# Patient Record
Sex: Male | Born: 1960
Health system: Southern US, Community
[De-identification: ages and names within clinical notes are randomized; demographics above are authoritative.]

## PROBLEM LIST (undated history)

## (undated) DIAGNOSIS — F32A Depression, unspecified: Secondary | ICD-10-CM

## (undated) DIAGNOSIS — N400 Enlarged prostate without lower urinary tract symptoms: Secondary | ICD-10-CM

## (undated) DIAGNOSIS — E785 Hyperlipidemia, unspecified: Secondary | ICD-10-CM

## (undated) DIAGNOSIS — E119 Type 2 diabetes mellitus without complications: Secondary | ICD-10-CM

## (undated) DIAGNOSIS — F329 Major depressive disorder, single episode, unspecified: Secondary | ICD-10-CM

## (undated) DIAGNOSIS — M1990 Unspecified osteoarthritis, unspecified site: Secondary | ICD-10-CM

## (undated) HISTORY — DX: Depression, unspecified: F32.A

## (undated) HISTORY — PX: FRACTURE SURGERY: SHX138

## (undated) HISTORY — DX: Benign prostatic hyperplasia without lower urinary tract symptoms: N40.0

## (undated) HISTORY — PX: TOTAL HIP ARTHROPLASTY: SHX124

## (undated) HISTORY — DX: Unspecified osteoarthritis, unspecified site: M19.90

## (undated) HISTORY — DX: Major depressive disorder, single episode, unspecified: F32.9

## (undated) HISTORY — PX: SPINE SURGERY: SHX786

## (undated) HISTORY — DX: Type 2 diabetes mellitus without complications: E11.9

## (undated) HISTORY — DX: Hyperlipidemia, unspecified: E78.5

## (undated) HISTORY — PX: CYSTOSCOPY WITH INSERTION OF UROLIFT: SHX6678

---

## 2006-04-21 ENCOUNTER — Encounter
Admission: RE | Admit: 2006-04-21 | Discharge: 2006-04-21 | Payer: Self-pay | Admitting: Physical Medicine and Rehabilitation

## 2006-05-28 ENCOUNTER — Encounter: Admission: RE | Admit: 2006-05-28 | Discharge: 2006-05-28 | Payer: Self-pay | Admitting: Specialist

## 2006-06-18 ENCOUNTER — Ambulatory Visit (HOSPITAL_COMMUNITY): Admission: RE | Admit: 2006-06-18 | Discharge: 2006-06-19 | Payer: Self-pay | Admitting: Specialist

## 2009-06-26 ENCOUNTER — Ambulatory Visit (HOSPITAL_COMMUNITY): Admission: RE | Admit: 2009-06-26 | Discharge: 2009-06-27 | Payer: Self-pay | Admitting: Specialist

## 2010-04-21 LAB — BASIC METABOLIC PANEL
BUN: 6 mg/dL (ref 6–23)
CO2: 31 mEq/L (ref 19–32)
Calcium: 9.1 mg/dL (ref 8.4–10.5)
Chloride: 104 mEq/L (ref 96–112)
Creatinine, Ser: 0.75 mg/dL (ref 0.4–1.5)
GFR calc non Af Amer: >60 mL/min (ref >60)
Glucose, Bld: 152 mg/dL — ABNORMAL HIGH (ref 70–99)
Sodium: 141 mEq/L (ref 135–145)

## 2010-04-21 LAB — COMPREHENSIVE METABOLIC PANEL
BUN: 7 mg/dL (ref 6–23)
Chloride: 103 mEq/L (ref 96–112)
Creatinine, Ser: 0.68 mg/dL (ref 0.4–1.5)
GFR calc non Af Amer: >60 mL/min (ref >60)
Sodium: 140 mEq/L (ref 135–145)
Total Protein: 7 g/dL (ref 6.0–8.3)

## 2010-04-21 LAB — CBC
HCT: 40.8 % (ref 39.0–52.0)
Hemoglobin: 13.3 g/dL (ref 13.0–17.0)
MCHC: 34.2 g/dL (ref 30.0–36.0)
MCV: 94.3 fL (ref 78.0–100.0)
RBC: 4.38 MIL/uL (ref 4.22–5.81)
RDW: 12.7 % (ref 11.5–15.5)
WBC: 3 10*3/uL — ABNORMAL LOW (ref 4.0–10.5)

## 2010-04-21 LAB — URINALYSIS, ROUTINE W REFLEX MICROSCOPIC
Bilirubin Urine: NEGATIVE
Glucose, UA: 100 mg/dL — AB
Nitrite: NEGATIVE
Specific Gravity, Urine: 1.022 (ref 1.005–1.030)
pH: 7 (ref 5.0–8.0)

## 2010-04-21 LAB — APTT: aPTT: 32 seconds (ref 24–37)

## 2010-06-17 NOTE — Op Note (Signed)
Brad Johnson, Brad Johnson NO.:  [id number]   MEDICAL RECORD NO.:  [id number]          PATIENT TYPE:  AMB   LOCATION:  DAY                          FACILITY:  [facility]   PHYSICIAN:  Jene Every, M.D.    DATE OF BIRTH:  1960/10/27   DATE OF PROCEDURE:  06/18/2006  DATE OF DISCHARGE:                               OPERATIVE REPORT   PREOPERATIVE DIAGNOSIS:  Spinal stenosis, retrolisthesis, 4-5.   POSTOPERATIVE DIAGNOSIS:  Spinal stenosis, retrolisthesis, 4-5.   PROCEDURE PERFORMED:  1. Insertion of X STOP at L4-5.  2. Excision of posterior element.   ANESTHESIA:  General.   ASSISTANT:  None.   BRIEF HISTORY/INDICATIONS:  This is a 50 year old with retrolisthesis,  lateral recess stenosis.  Operative intervention was indicated for  insertion of X STOP.  Patient had relief of symptoms with sitting and  leaning forward.  Precipitation of the symptoms with standing.  He was  able to walk 50 feet.  He had lateral recess stenosis and  retrolisthesis.  The options were whether to decompress him.  He was a  fairly large size, and he has retrolisthesis and congenital stenosis.  We felt that would further destabilize the posterior elements and  exacerbate his listhesis.  We felt that placement of X STOP would avoid  extension and avoid loading of the lateral recesses, and in his large  size, not prevent manual duty.  We discussed the risks and benefits,  including bleeding, infection, no change in symptoms or worsening  symptoms, need for X STOP removal and standard decompression, etc.   TECHNIQUE:  With the patient in a supine position after the induction of  adequate general anesthesia and 2 gm of Kefzol, antimicrobial  prophylaxis, he was placed prone on the Wilson frame.  All bony  prominences were well padded.  The lumbar area is prepped and draped in  the usual sterile fashion.  An 18 gauge spinal needle was utilized to  localize the 4-5 interspace and confirmed with  x-ray.  An incision was  made from the spinous process of 4-5, and subcutaneous tissue was  dissected.  Electrocautery was utilized to achieve hemostasis.  The  dorsal lumbar fascia was identified and divided on either side of the  interspinous ligament with a #10 blade, sparing the interspinous  ligament.  Paraspinous musculature was elevated from the lamina of 4-5.  I digitally palpated the space anteriorly between the 4-5 spinous  processes and under x-ray visualization, placed a first dilator between  the interspinous ligament just posterior to the lamina from right to  left without difficulty.  I then inserted the distractor and distracted  to a 10 mm distraction torque.  This was left for a period of two  minutes for relaxation.  I then selected the 10 with a rongeur and  digitally removed with the rongeur portion of the posterior lamina for  easy insertion.  This is over onto the facet.  It was then inserted  without impedence.  Once this was inserted and confirmed by x-ray, we  attached a contralateral fin and provisionally set the  facet screw and  clamped either side of the fin, and then completed the torque.  We  completed the fixation with the torque wrench.  It was found to be in  satisfactory position.  The AP and lateral plane was posterior to the  lamina medial to lateral and the AP plane satisfactory.  Electrocautery  was utilized to achieve hemostasis, 0.25% Marcaine with epinephrine was  infiltrated into the paraspinous musculature.  Retractors were removed.  Paraspinous muscles were inspected.  No evidence of active bleeding.  I  repaired the dorsal lumbar fascia on either side of the interspinous  ligament with #1 Vicryl interrupted figure-of-eight sutures.  Subcutaneous tissue was reapproximated with 2-0 Vicryl simple sutures in  layers.  The skin was reapproximated with staples.  The wound was  dressed sterilely.  Placed supine on the hospital bed, extubated without   difficulty, and transported to the recovery room in satisfactory  condition.  Patient tolerated the procedure well with no complications.  Minimal blood loss.  No assistant.   When he was transferred to the hospital bed, we kept his knees and his  hips flexed to prevent an extension.      Jene Every, M.D.  Electronically Signed     JB/MEDQ  D:  06/18/2006  T:  06/18/2006  Job:  [id number]

## 2011-02-24 DIAGNOSIS — IMO0002 Reserved for concepts with insufficient information to code with codable children: Secondary | ICD-10-CM | POA: Diagnosis not present

## 2011-03-30 DIAGNOSIS — IMO0002 Reserved for concepts with insufficient information to code with codable children: Secondary | ICD-10-CM | POA: Diagnosis not present

## 2011-04-14 DIAGNOSIS — R1084 Generalized abdominal pain: Secondary | ICD-10-CM | POA: Diagnosis not present

## 2011-04-14 DIAGNOSIS — N419 Inflammatory disease of prostate, unspecified: Secondary | ICD-10-CM | POA: Diagnosis not present

## 2011-04-21 DIAGNOSIS — E785 Hyperlipidemia, unspecified: Secondary | ICD-10-CM | POA: Diagnosis not present

## 2011-04-21 DIAGNOSIS — I10 Essential (primary) hypertension: Secondary | ICD-10-CM | POA: Diagnosis not present

## 2011-04-21 DIAGNOSIS — S060X9A Concussion with loss of consciousness of unspecified duration, initial encounter: Secondary | ICD-10-CM | POA: Diagnosis not present

## 2011-05-05 DIAGNOSIS — D126 Benign neoplasm of colon, unspecified: Secondary | ICD-10-CM | POA: Diagnosis not present

## 2011-05-05 DIAGNOSIS — Z1211 Encounter for screening for malignant neoplasm of colon: Secondary | ICD-10-CM | POA: Diagnosis not present

## 2011-05-05 DIAGNOSIS — D128 Benign neoplasm of rectum: Secondary | ICD-10-CM | POA: Diagnosis not present

## 2011-05-21 DIAGNOSIS — E1149 Type 2 diabetes mellitus with other diabetic neurological complication: Secondary | ICD-10-CM | POA: Diagnosis not present

## 2011-05-21 DIAGNOSIS — E785 Hyperlipidemia, unspecified: Secondary | ICD-10-CM | POA: Diagnosis not present

## 2011-05-21 DIAGNOSIS — F0390 Unspecified dementia without behavioral disturbance: Secondary | ICD-10-CM | POA: Diagnosis not present

## 2011-05-21 DIAGNOSIS — I10 Essential (primary) hypertension: Secondary | ICD-10-CM | POA: Diagnosis not present

## 2011-05-26 DIAGNOSIS — IMO0002 Reserved for concepts with insufficient information to code with codable children: Secondary | ICD-10-CM | POA: Diagnosis not present

## 2011-06-11 DIAGNOSIS — IMO0002 Reserved for concepts with insufficient information to code with codable children: Secondary | ICD-10-CM | POA: Diagnosis not present

## 2011-07-15 DIAGNOSIS — M48061 Spinal stenosis, lumbar region without neurogenic claudication: Secondary | ICD-10-CM | POA: Diagnosis not present

## 2011-07-21 DIAGNOSIS — I10 Essential (primary) hypertension: Secondary | ICD-10-CM | POA: Diagnosis not present

## 2011-07-21 DIAGNOSIS — F0390 Unspecified dementia without behavioral disturbance: Secondary | ICD-10-CM | POA: Diagnosis not present

## 2011-07-21 DIAGNOSIS — E1149 Type 2 diabetes mellitus with other diabetic neurological complication: Secondary | ICD-10-CM | POA: Diagnosis not present

## 2011-07-21 DIAGNOSIS — E785 Hyperlipidemia, unspecified: Secondary | ICD-10-CM | POA: Diagnosis not present

## 2011-08-12 DIAGNOSIS — M961 Postlaminectomy syndrome, not elsewhere classified: Secondary | ICD-10-CM | POA: Diagnosis not present

## 2011-08-12 DIAGNOSIS — M549 Dorsalgia, unspecified: Secondary | ICD-10-CM | POA: Diagnosis not present

## 2011-08-12 DIAGNOSIS — M5137 Other intervertebral disc degeneration, lumbosacral region: Secondary | ICD-10-CM | POA: Diagnosis not present

## 2011-08-12 DIAGNOSIS — G894 Chronic pain syndrome: Secondary | ICD-10-CM | POA: Diagnosis not present

## 2011-10-28 DIAGNOSIS — E1149 Type 2 diabetes mellitus with other diabetic neurological complication: Secondary | ICD-10-CM | POA: Diagnosis not present

## 2011-10-28 DIAGNOSIS — I10 Essential (primary) hypertension: Secondary | ICD-10-CM | POA: Diagnosis not present

## 2011-10-28 DIAGNOSIS — E785 Hyperlipidemia, unspecified: Secondary | ICD-10-CM | POA: Diagnosis not present

## 2011-10-28 DIAGNOSIS — F0390 Unspecified dementia without behavioral disturbance: Secondary | ICD-10-CM | POA: Diagnosis not present

## 2011-10-28 DIAGNOSIS — E291 Testicular hypofunction: Secondary | ICD-10-CM | POA: Diagnosis not present

## 2011-11-04 DIAGNOSIS — G894 Chronic pain syndrome: Secondary | ICD-10-CM | POA: Diagnosis not present

## 2011-11-25 DIAGNOSIS — E1149 Type 2 diabetes mellitus with other diabetic neurological complication: Secondary | ICD-10-CM | POA: Diagnosis not present

## 2011-11-25 DIAGNOSIS — E1142 Type 2 diabetes mellitus with diabetic polyneuropathy: Secondary | ICD-10-CM | POA: Diagnosis not present

## 2011-12-17 DIAGNOSIS — J209 Acute bronchitis, unspecified: Secondary | ICD-10-CM | POA: Diagnosis not present

## 2011-12-17 DIAGNOSIS — J019 Acute sinusitis, unspecified: Secondary | ICD-10-CM | POA: Diagnosis not present

## 2011-12-29 DIAGNOSIS — M549 Dorsalgia, unspecified: Secondary | ICD-10-CM | POA: Diagnosis not present

## 2011-12-29 DIAGNOSIS — M48061 Spinal stenosis, lumbar region without neurogenic claudication: Secondary | ICD-10-CM | POA: Diagnosis not present

## 2011-12-29 DIAGNOSIS — G894 Chronic pain syndrome: Secondary | ICD-10-CM | POA: Diagnosis not present

## 2012-01-25 DIAGNOSIS — E119 Type 2 diabetes mellitus without complications: Secondary | ICD-10-CM | POA: Diagnosis not present

## 2012-01-25 DIAGNOSIS — I10 Essential (primary) hypertension: Secondary | ICD-10-CM | POA: Diagnosis not present

## 2012-01-25 DIAGNOSIS — E785 Hyperlipidemia, unspecified: Secondary | ICD-10-CM | POA: Diagnosis not present

## 2012-01-25 DIAGNOSIS — E1149 Type 2 diabetes mellitus with other diabetic neurological complication: Secondary | ICD-10-CM | POA: Diagnosis not present

## 2012-01-25 DIAGNOSIS — E1142 Type 2 diabetes mellitus with diabetic polyneuropathy: Secondary | ICD-10-CM | POA: Diagnosis not present

## 2012-02-06 DIAGNOSIS — J209 Acute bronchitis, unspecified: Secondary | ICD-10-CM | POA: Diagnosis not present

## 2012-02-06 DIAGNOSIS — J069 Acute upper respiratory infection, unspecified: Secondary | ICD-10-CM | POA: Diagnosis not present

## 2012-02-27 IMAGING — CR DG CHEST 2V
2 series · 2 of 2 positions shown · non-contrast
Comparison: None.

CLINICAL DATA: 48-year-old male preoperative study for spinal
stenosis.

CHEST - 2 VIEW

[w chest pa]
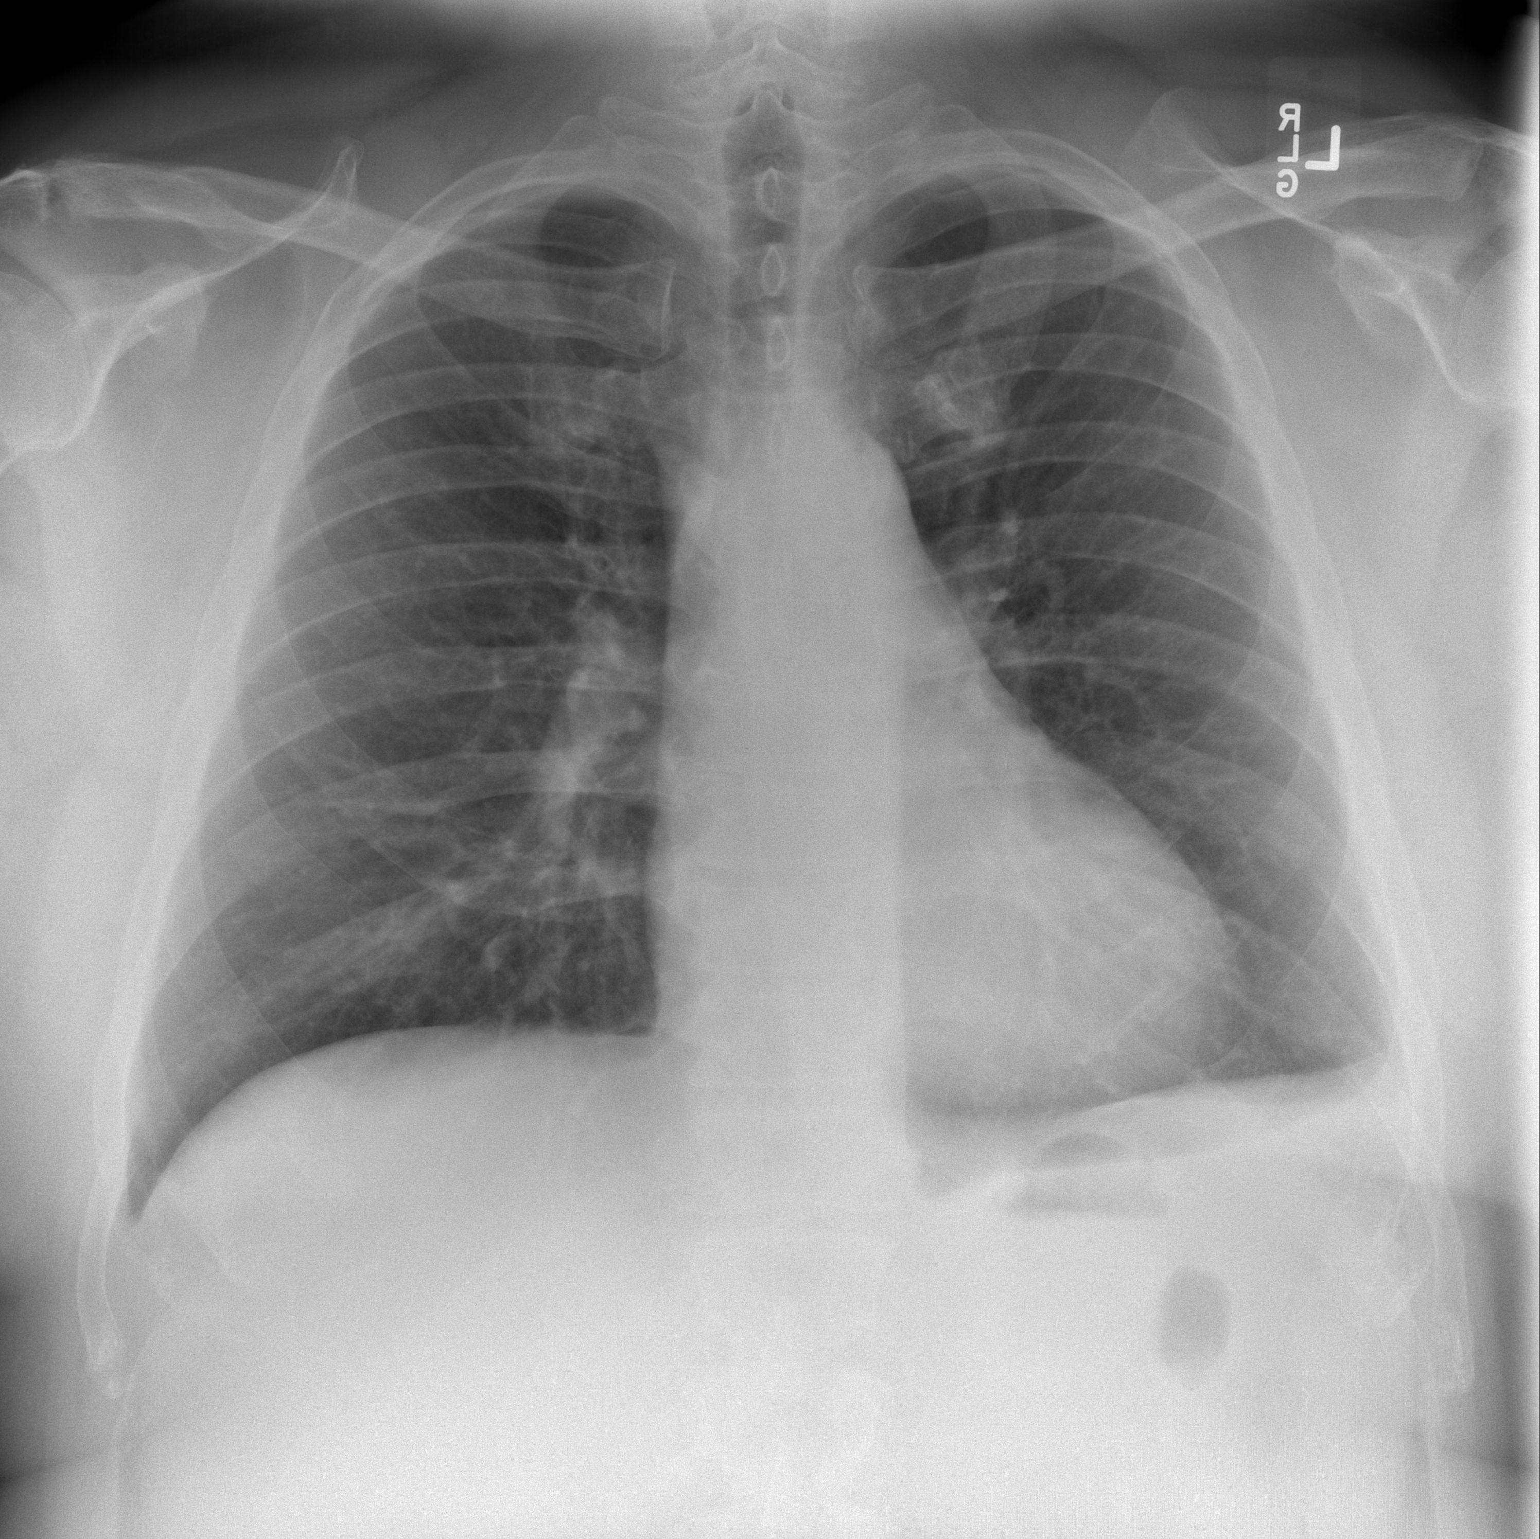

[w chest lat]
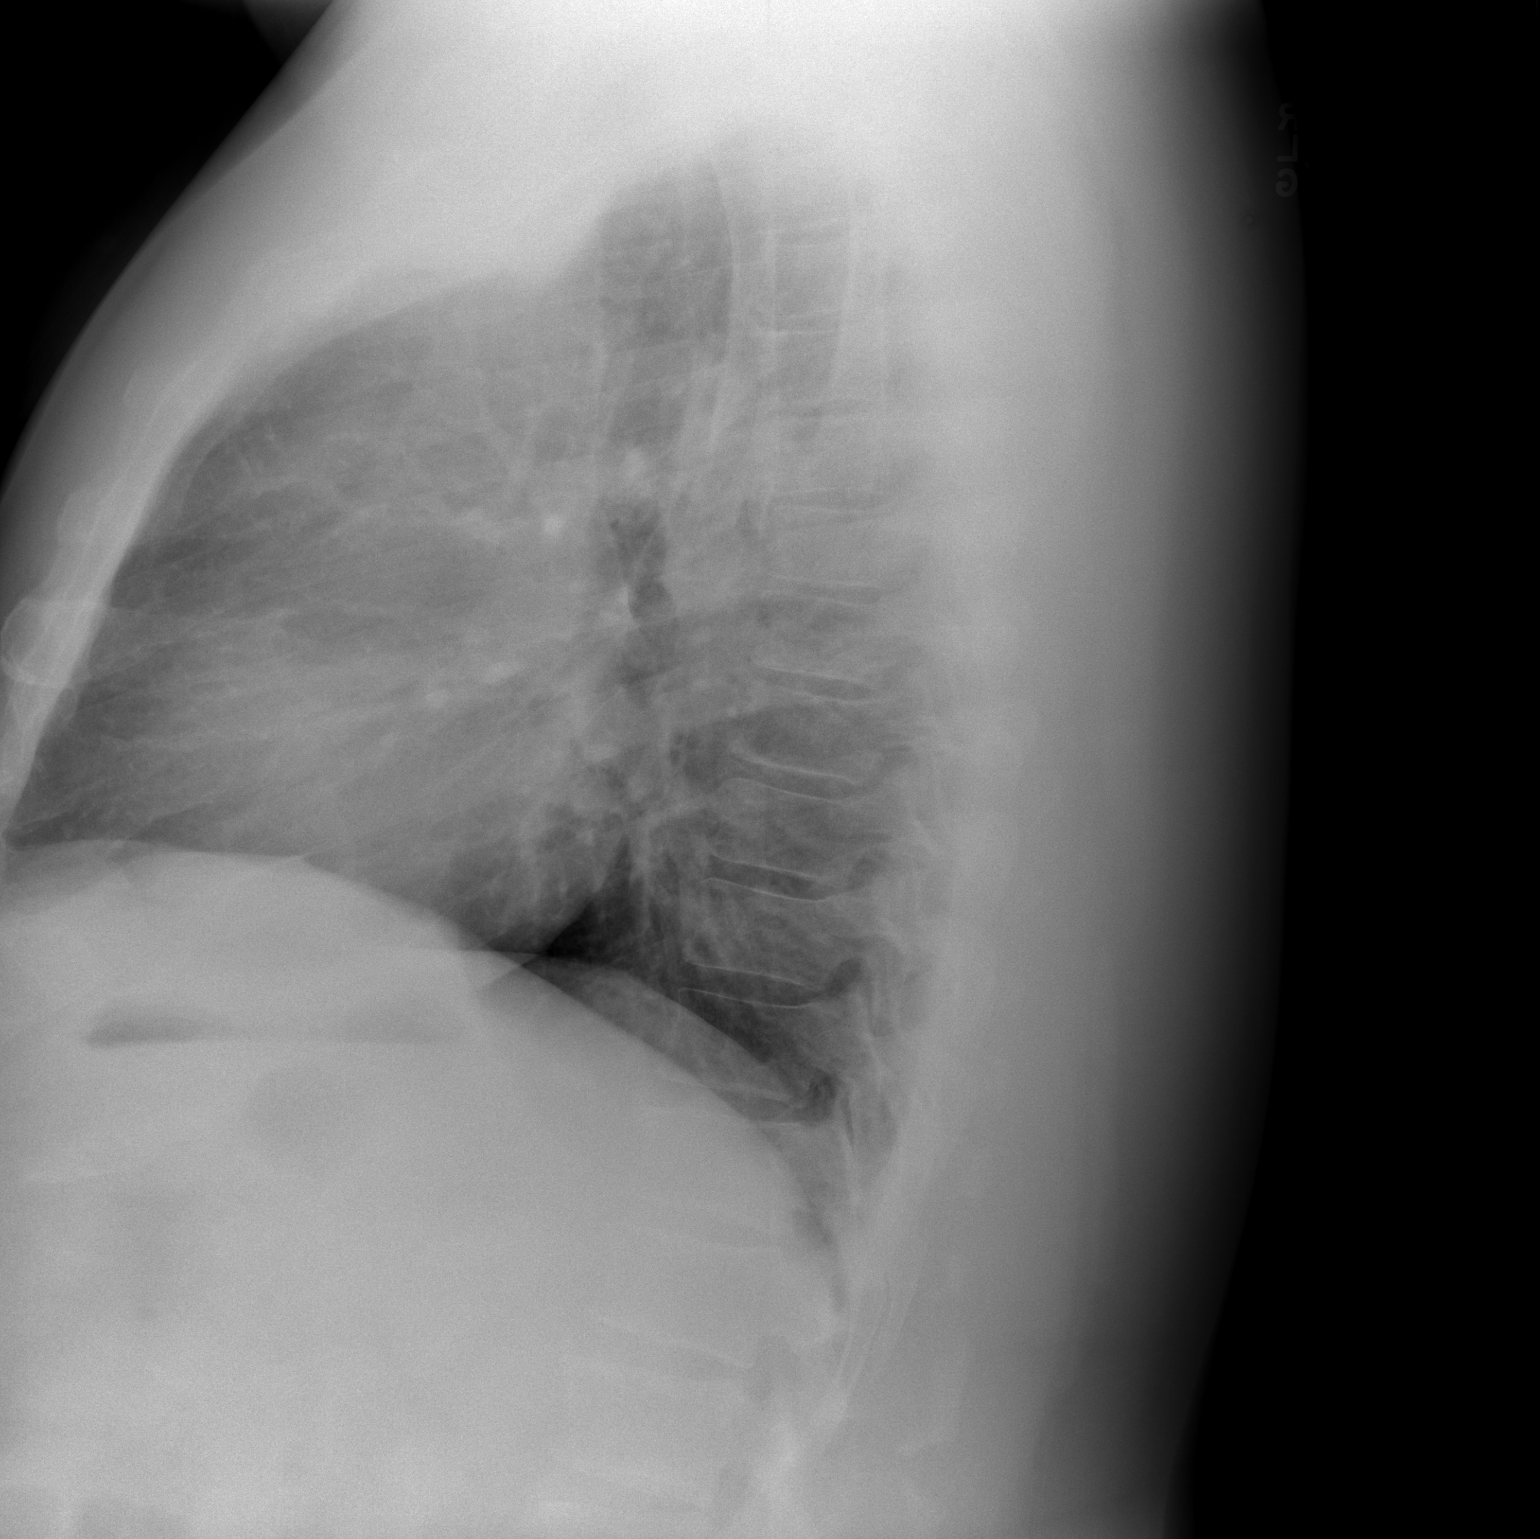

[2 of 2 positions shown; findings below may reference images not displayed]

FINDINGS: Incidental note made of 12 sets of ribs.  Blunting of the
left lateral costophrenic sulcus without other evidence of pleural
effusion.  Cardiac size and mediastinal contours are within normal
limits.  Lung volumes are within normal limits.  No pneumothorax,
pulmonary edema or confluent airspace opacity. Visualized tracheal
air column is within normal limits.
IMPRESSION: Left lateral costophrenic angle pleural scarring suspected. No
acute cardiopulmonary abnormality.

## 2012-02-28 IMAGING — CR DG OR PORTABLE SPINE
1 series · 1 of 1 positions shown · non-contrast
Comparison: 06/26/2009

CLINICAL DATA: Spinal stenosis at L3-L5.

PORTABLE SPINE

[view not recorded]
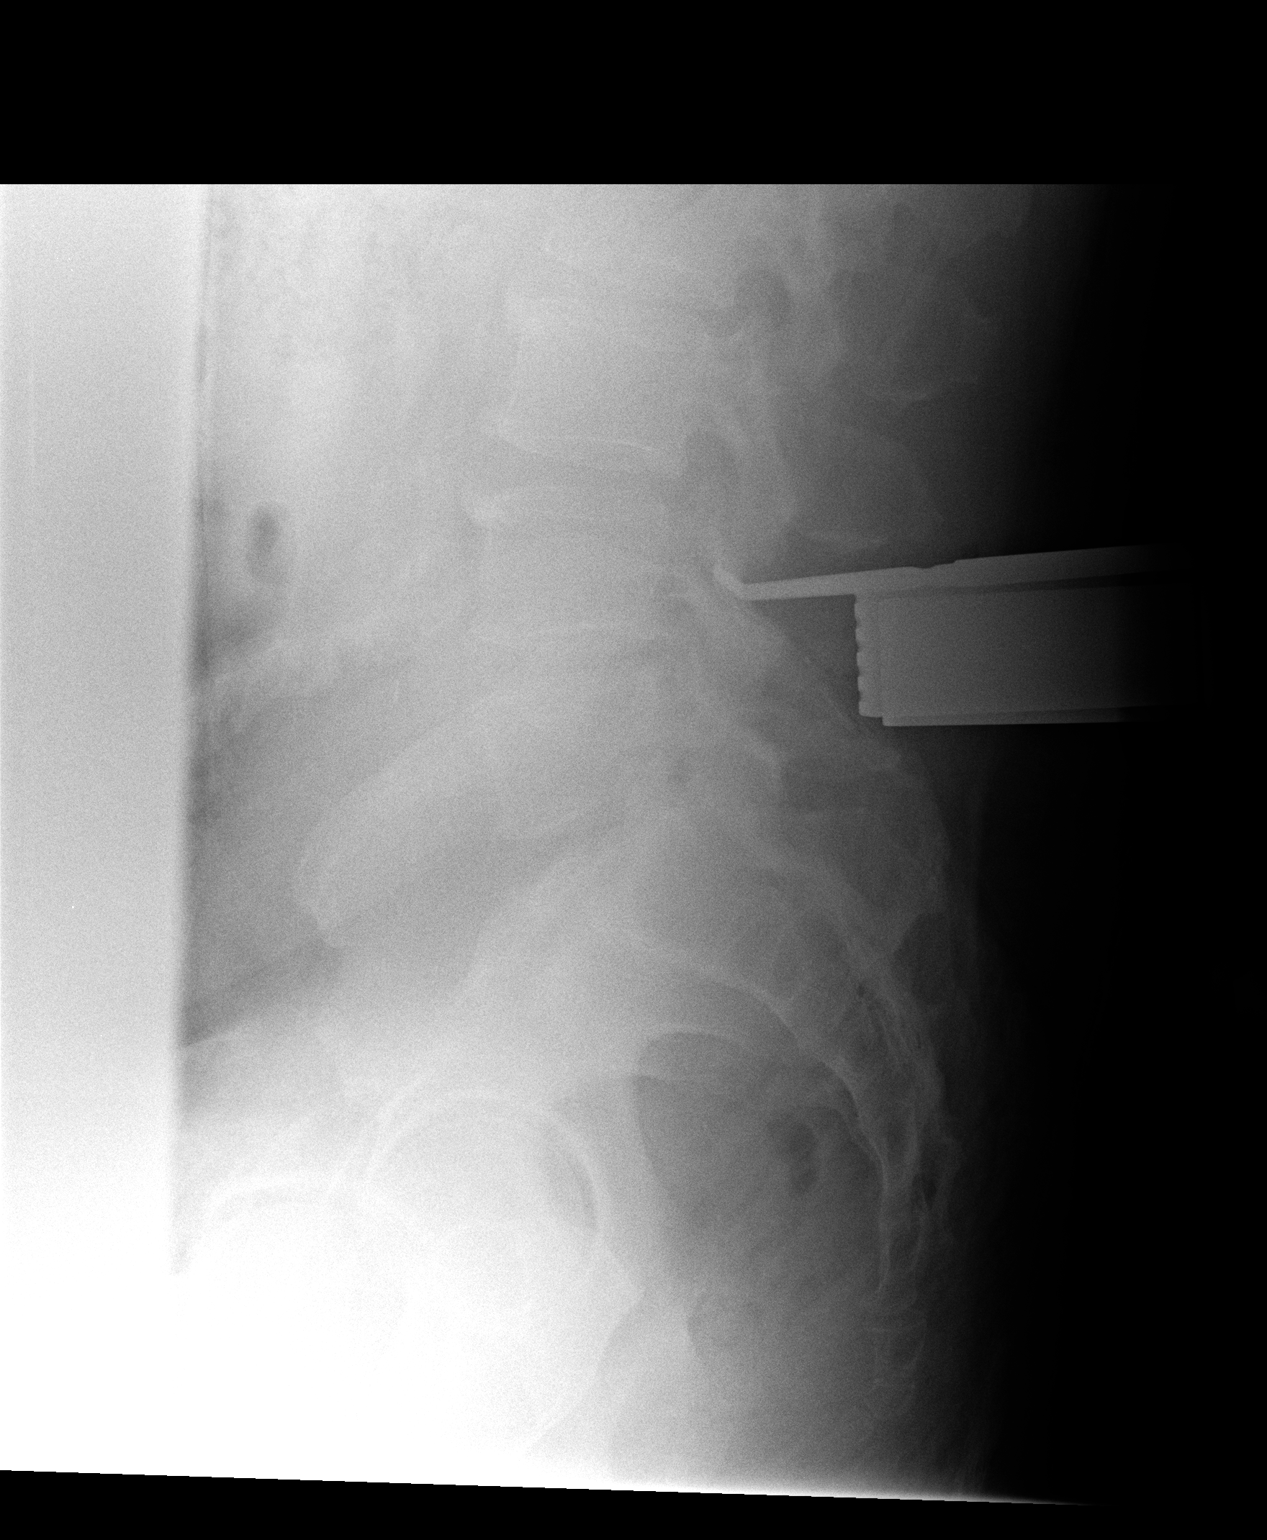

[1 of 1 positions shown; findings below may reference images not displayed]

FINDINGS: There are tissue spreaders posteriorly overlying the L4
spinous process.  There is a surgical instrument overlying the
spinal canal directed at the mid level of the L4 vertebral body.

No acute fracture is seen.  There is lumbar disc degeneration.  The
lowest open interspace is assigned L5-S1.
IMPRESSION: Surgical instrument directed at the L4 vertebral body.

## 2012-03-23 DIAGNOSIS — M48061 Spinal stenosis, lumbar region without neurogenic claudication: Secondary | ICD-10-CM | POA: Diagnosis not present

## 2012-04-01 DIAGNOSIS — N471 Phimosis: Secondary | ICD-10-CM | POA: Diagnosis not present

## 2012-04-01 DIAGNOSIS — N478 Other disorders of prepuce: Secondary | ICD-10-CM | POA: Diagnosis not present

## 2012-04-07 DIAGNOSIS — N471 Phimosis: Secondary | ICD-10-CM | POA: Diagnosis not present

## 2012-04-07 DIAGNOSIS — N478 Other disorders of prepuce: Secondary | ICD-10-CM | POA: Diagnosis not present

## 2012-04-08 DIAGNOSIS — N400 Enlarged prostate without lower urinary tract symptoms: Secondary | ICD-10-CM | POA: Diagnosis not present

## 2012-04-08 DIAGNOSIS — N476 Balanoposthitis: Secondary | ICD-10-CM | POA: Diagnosis not present

## 2012-04-08 DIAGNOSIS — N390 Urinary tract infection, site not specified: Secondary | ICD-10-CM | POA: Diagnosis not present

## 2012-04-08 DIAGNOSIS — N478 Other disorders of prepuce: Secondary | ICD-10-CM | POA: Diagnosis not present

## 2012-04-11 DIAGNOSIS — N23 Unspecified renal colic: Secondary | ICD-10-CM | POA: Diagnosis not present

## 2012-04-11 DIAGNOSIS — R351 Nocturia: Secondary | ICD-10-CM | POA: Diagnosis not present

## 2012-04-11 DIAGNOSIS — N478 Other disorders of prepuce: Secondary | ICD-10-CM | POA: Diagnosis not present

## 2012-04-11 DIAGNOSIS — N476 Balanoposthitis: Secondary | ICD-10-CM | POA: Diagnosis not present

## 2012-04-13 DIAGNOSIS — N478 Other disorders of prepuce: Secondary | ICD-10-CM | POA: Diagnosis not present

## 2012-04-13 DIAGNOSIS — Z79899 Other long term (current) drug therapy: Secondary | ICD-10-CM | POA: Diagnosis not present

## 2012-04-13 DIAGNOSIS — N471 Phimosis: Secondary | ICD-10-CM | POA: Diagnosis not present

## 2012-04-13 DIAGNOSIS — E119 Type 2 diabetes mellitus without complications: Secondary | ICD-10-CM | POA: Diagnosis not present

## 2012-04-13 DIAGNOSIS — E669 Obesity, unspecified: Secondary | ICD-10-CM | POA: Diagnosis not present

## 2012-04-19 DIAGNOSIS — N471 Phimosis: Secondary | ICD-10-CM | POA: Diagnosis not present

## 2012-04-19 DIAGNOSIS — N390 Urinary tract infection, site not specified: Secondary | ICD-10-CM | POA: Diagnosis not present

## 2012-04-19 DIAGNOSIS — N476 Balanoposthitis: Secondary | ICD-10-CM | POA: Diagnosis not present

## 2012-04-19 DIAGNOSIS — M545 Low back pain: Secondary | ICD-10-CM | POA: Diagnosis not present

## 2012-05-04 DIAGNOSIS — N478 Other disorders of prepuce: Secondary | ICD-10-CM | POA: Diagnosis not present

## 2012-05-04 DIAGNOSIS — N390 Urinary tract infection, site not specified: Secondary | ICD-10-CM | POA: Diagnosis not present

## 2012-05-04 DIAGNOSIS — N476 Balanoposthitis: Secondary | ICD-10-CM | POA: Diagnosis not present

## 2012-05-04 DIAGNOSIS — N23 Unspecified renal colic: Secondary | ICD-10-CM | POA: Diagnosis not present

## 2012-05-24 DIAGNOSIS — E1149 Type 2 diabetes mellitus with other diabetic neurological complication: Secondary | ICD-10-CM | POA: Diagnosis not present

## 2012-05-24 DIAGNOSIS — I10 Essential (primary) hypertension: Secondary | ICD-10-CM | POA: Diagnosis not present

## 2012-05-24 DIAGNOSIS — E1142 Type 2 diabetes mellitus with diabetic polyneuropathy: Secondary | ICD-10-CM | POA: Diagnosis not present

## 2012-05-24 DIAGNOSIS — E119 Type 2 diabetes mellitus without complications: Secondary | ICD-10-CM | POA: Diagnosis not present

## 2012-05-24 DIAGNOSIS — M5126 Other intervertebral disc displacement, lumbar region: Secondary | ICD-10-CM | POA: Diagnosis not present

## 2012-08-11 DIAGNOSIS — M545 Low back pain: Secondary | ICD-10-CM | POA: Diagnosis not present

## 2012-08-11 DIAGNOSIS — M5137 Other intervertebral disc degeneration, lumbosacral region: Secondary | ICD-10-CM | POA: Diagnosis not present

## 2012-08-11 DIAGNOSIS — G894 Chronic pain syndrome: Secondary | ICD-10-CM | POA: Diagnosis not present

## 2012-08-11 DIAGNOSIS — M48061 Spinal stenosis, lumbar region without neurogenic claudication: Secondary | ICD-10-CM | POA: Diagnosis not present

## 2012-09-08 DIAGNOSIS — M48061 Spinal stenosis, lumbar region without neurogenic claudication: Secondary | ICD-10-CM | POA: Diagnosis not present

## 2012-10-05 DIAGNOSIS — M5126 Other intervertebral disc displacement, lumbar region: Secondary | ICD-10-CM | POA: Diagnosis not present

## 2012-10-05 DIAGNOSIS — E1142 Type 2 diabetes mellitus with diabetic polyneuropathy: Secondary | ICD-10-CM | POA: Diagnosis not present

## 2012-10-05 DIAGNOSIS — E1149 Type 2 diabetes mellitus with other diabetic neurological complication: Secondary | ICD-10-CM | POA: Diagnosis not present

## 2012-10-05 DIAGNOSIS — I10 Essential (primary) hypertension: Secondary | ICD-10-CM | POA: Diagnosis not present

## 2012-10-05 DIAGNOSIS — E785 Hyperlipidemia, unspecified: Secondary | ICD-10-CM | POA: Diagnosis not present

## 2012-11-09 DIAGNOSIS — M48061 Spinal stenosis, lumbar region without neurogenic claudication: Secondary | ICD-10-CM | POA: Diagnosis not present

## 2012-11-29 DIAGNOSIS — M545 Low back pain: Secondary | ICD-10-CM | POA: Diagnosis not present

## 2012-11-29 DIAGNOSIS — M961 Postlaminectomy syndrome, not elsewhere classified: Secondary | ICD-10-CM | POA: Diagnosis not present

## 2012-12-01 DIAGNOSIS — E119 Type 2 diabetes mellitus without complications: Secondary | ICD-10-CM | POA: Diagnosis not present

## 2012-12-08 DIAGNOSIS — M545 Low back pain: Secondary | ICD-10-CM | POA: Diagnosis not present

## 2013-01-02 DIAGNOSIS — M542 Cervicalgia: Secondary | ICD-10-CM | POA: Diagnosis not present

## 2013-01-02 DIAGNOSIS — M549 Dorsalgia, unspecified: Secondary | ICD-10-CM | POA: Diagnosis not present

## 2013-02-08 DIAGNOSIS — M961 Postlaminectomy syndrome, not elsewhere classified: Secondary | ICD-10-CM | POA: Diagnosis not present

## 2013-02-08 DIAGNOSIS — G894 Chronic pain syndrome: Secondary | ICD-10-CM | POA: Diagnosis not present

## 2013-02-22 DIAGNOSIS — E1149 Type 2 diabetes mellitus with other diabetic neurological complication: Secondary | ICD-10-CM | POA: Diagnosis not present

## 2013-02-22 DIAGNOSIS — I10 Essential (primary) hypertension: Secondary | ICD-10-CM | POA: Diagnosis not present

## 2013-02-22 DIAGNOSIS — E785 Hyperlipidemia, unspecified: Secondary | ICD-10-CM | POA: Diagnosis not present

## 2013-02-22 DIAGNOSIS — E1142 Type 2 diabetes mellitus with diabetic polyneuropathy: Secondary | ICD-10-CM | POA: Diagnosis not present

## 2013-03-26 DIAGNOSIS — B9789 Other viral agents as the cause of diseases classified elsewhere: Secondary | ICD-10-CM | POA: Diagnosis not present

## 2013-04-05 DIAGNOSIS — M545 Low back pain, unspecified: Secondary | ICD-10-CM | POA: Diagnosis not present

## 2013-04-05 DIAGNOSIS — G894 Chronic pain syndrome: Secondary | ICD-10-CM | POA: Diagnosis not present

## 2013-04-05 DIAGNOSIS — M961 Postlaminectomy syndrome, not elsewhere classified: Secondary | ICD-10-CM | POA: Diagnosis not present

## 2013-04-05 DIAGNOSIS — M48061 Spinal stenosis, lumbar region without neurogenic claudication: Secondary | ICD-10-CM | POA: Diagnosis not present

## 2013-06-11 DIAGNOSIS — J0100 Acute maxillary sinusitis, unspecified: Secondary | ICD-10-CM | POA: Diagnosis not present

## 2013-06-21 DIAGNOSIS — M545 Low back pain, unspecified: Secondary | ICD-10-CM | POA: Diagnosis not present

## 2013-06-21 DIAGNOSIS — G894 Chronic pain syndrome: Secondary | ICD-10-CM | POA: Diagnosis not present

## 2013-06-21 DIAGNOSIS — Z9889 Other specified postprocedural states: Secondary | ICD-10-CM | POA: Diagnosis not present

## 2013-06-21 DIAGNOSIS — Z79899 Other long term (current) drug therapy: Secondary | ICD-10-CM | POA: Diagnosis not present

## 2013-06-22 DIAGNOSIS — E1142 Type 2 diabetes mellitus with diabetic polyneuropathy: Secondary | ICD-10-CM | POA: Diagnosis not present

## 2013-06-22 DIAGNOSIS — I10 Essential (primary) hypertension: Secondary | ICD-10-CM | POA: Diagnosis not present

## 2013-06-22 DIAGNOSIS — E785 Hyperlipidemia, unspecified: Secondary | ICD-10-CM | POA: Diagnosis not present

## 2013-06-22 DIAGNOSIS — E1149 Type 2 diabetes mellitus with other diabetic neurological complication: Secondary | ICD-10-CM | POA: Diagnosis not present

## 2013-06-27 DIAGNOSIS — M545 Low back pain, unspecified: Secondary | ICD-10-CM | POA: Diagnosis not present

## 2013-07-13 DIAGNOSIS — Z79899 Other long term (current) drug therapy: Secondary | ICD-10-CM | POA: Diagnosis not present

## 2013-07-13 DIAGNOSIS — G894 Chronic pain syndrome: Secondary | ICD-10-CM | POA: Diagnosis not present

## 2013-07-13 DIAGNOSIS — M961 Postlaminectomy syndrome, not elsewhere classified: Secondary | ICD-10-CM | POA: Diagnosis not present

## 2013-08-25 DIAGNOSIS — M1610 Unilateral primary osteoarthritis, unspecified hip: Secondary | ICD-10-CM | POA: Diagnosis not present

## 2013-08-31 DIAGNOSIS — M25559 Pain in unspecified hip: Secondary | ICD-10-CM | POA: Diagnosis not present

## 2013-08-31 DIAGNOSIS — M169 Osteoarthritis of hip, unspecified: Secondary | ICD-10-CM | POA: Diagnosis not present

## 2013-08-31 DIAGNOSIS — M1610 Unilateral primary osteoarthritis, unspecified hip: Secondary | ICD-10-CM | POA: Diagnosis not present

## 2013-09-06 DIAGNOSIS — M25559 Pain in unspecified hip: Secondary | ICD-10-CM | POA: Diagnosis not present

## 2013-09-13 DIAGNOSIS — M25559 Pain in unspecified hip: Secondary | ICD-10-CM | POA: Diagnosis not present

## 2013-09-15 DIAGNOSIS — M25559 Pain in unspecified hip: Secondary | ICD-10-CM | POA: Diagnosis not present

## 2013-09-20 DIAGNOSIS — M25559 Pain in unspecified hip: Secondary | ICD-10-CM | POA: Diagnosis not present

## 2013-09-22 DIAGNOSIS — M25559 Pain in unspecified hip: Secondary | ICD-10-CM | POA: Diagnosis not present

## 2013-09-25 DIAGNOSIS — M25559 Pain in unspecified hip: Secondary | ICD-10-CM | POA: Diagnosis not present

## 2013-09-27 DIAGNOSIS — M25559 Pain in unspecified hip: Secondary | ICD-10-CM | POA: Diagnosis not present

## 2013-10-03 DIAGNOSIS — M25559 Pain in unspecified hip: Secondary | ICD-10-CM | POA: Diagnosis not present

## 2013-10-10 DIAGNOSIS — M1610 Unilateral primary osteoarthritis, unspecified hip: Secondary | ICD-10-CM | POA: Diagnosis not present

## 2013-10-12 DIAGNOSIS — M25559 Pain in unspecified hip: Secondary | ICD-10-CM | POA: Diagnosis not present

## 2013-10-18 DIAGNOSIS — G894 Chronic pain syndrome: Secondary | ICD-10-CM | POA: Diagnosis not present

## 2013-10-18 DIAGNOSIS — M961 Postlaminectomy syndrome, not elsewhere classified: Secondary | ICD-10-CM | POA: Diagnosis not present

## 2013-11-07 DIAGNOSIS — E1142 Type 2 diabetes mellitus with diabetic polyneuropathy: Secondary | ICD-10-CM | POA: Diagnosis not present

## 2013-11-07 DIAGNOSIS — S0990XA Unspecified injury of head, initial encounter: Secondary | ICD-10-CM | POA: Diagnosis not present

## 2013-11-07 DIAGNOSIS — E1140 Type 2 diabetes mellitus with diabetic neuropathy, unspecified: Secondary | ICD-10-CM | POA: Diagnosis not present

## 2013-11-07 DIAGNOSIS — F0390 Unspecified dementia without behavioral disturbance: Secondary | ICD-10-CM | POA: Diagnosis not present

## 2013-11-07 DIAGNOSIS — Z6841 Body Mass Index (BMI) 40.0 and over, adult: Secondary | ICD-10-CM | POA: Diagnosis not present

## 2013-11-07 DIAGNOSIS — E785 Hyperlipidemia, unspecified: Secondary | ICD-10-CM | POA: Diagnosis not present

## 2013-12-22 DIAGNOSIS — M1612 Unilateral primary osteoarthritis, left hip: Secondary | ICD-10-CM | POA: Diagnosis not present

## 2013-12-22 DIAGNOSIS — M25552 Pain in left hip: Secondary | ICD-10-CM | POA: Diagnosis not present

## 2014-01-28 DIAGNOSIS — R1032 Left lower quadrant pain: Secondary | ICD-10-CM | POA: Diagnosis not present

## 2014-01-28 DIAGNOSIS — Z7982 Long term (current) use of aspirin: Secondary | ICD-10-CM | POA: Diagnosis not present

## 2014-01-28 DIAGNOSIS — R0602 Shortness of breath: Secondary | ICD-10-CM | POA: Diagnosis not present

## 2014-01-28 DIAGNOSIS — R0789 Other chest pain: Secondary | ICD-10-CM | POA: Diagnosis not present

## 2014-01-28 DIAGNOSIS — R350 Frequency of micturition: Secondary | ICD-10-CM | POA: Diagnosis not present

## 2014-01-28 DIAGNOSIS — M549 Dorsalgia, unspecified: Secondary | ICD-10-CM | POA: Diagnosis not present

## 2014-01-28 DIAGNOSIS — R072 Precordial pain: Secondary | ICD-10-CM | POA: Diagnosis not present

## 2014-01-28 DIAGNOSIS — G8929 Other chronic pain: Secondary | ICD-10-CM | POA: Diagnosis not present

## 2014-01-28 DIAGNOSIS — E780 Pure hypercholesterolemia: Secondary | ICD-10-CM | POA: Diagnosis not present

## 2014-01-28 DIAGNOSIS — E1165 Type 2 diabetes mellitus with hyperglycemia: Secondary | ICD-10-CM | POA: Diagnosis not present

## 2014-01-28 DIAGNOSIS — Z79899 Other long term (current) drug therapy: Secondary | ICD-10-CM | POA: Diagnosis not present

## 2014-01-28 DIAGNOSIS — R109 Unspecified abdominal pain: Secondary | ICD-10-CM | POA: Diagnosis not present

## 2014-01-28 DIAGNOSIS — E119 Type 2 diabetes mellitus without complications: Secondary | ICD-10-CM | POA: Diagnosis not present

## 2014-01-29 DIAGNOSIS — R079 Chest pain, unspecified: Secondary | ICD-10-CM | POA: Diagnosis not present

## 2014-01-29 DIAGNOSIS — I10 Essential (primary) hypertension: Secondary | ICD-10-CM | POA: Diagnosis not present

## 2014-01-30 DIAGNOSIS — N410 Acute prostatitis: Secondary | ICD-10-CM | POA: Diagnosis not present

## 2014-01-30 DIAGNOSIS — N401 Enlarged prostate with lower urinary tract symptoms: Secondary | ICD-10-CM | POA: Diagnosis not present

## 2014-01-30 DIAGNOSIS — M549 Dorsalgia, unspecified: Secondary | ICD-10-CM | POA: Diagnosis not present

## 2014-01-30 DIAGNOSIS — R351 Nocturia: Secondary | ICD-10-CM | POA: Diagnosis not present

## 2014-01-30 DIAGNOSIS — N3090 Cystitis, unspecified without hematuria: Secondary | ICD-10-CM | POA: Diagnosis not present

## 2014-02-14 DIAGNOSIS — Z6841 Body Mass Index (BMI) 40.0 and over, adult: Secondary | ICD-10-CM | POA: Diagnosis not present

## 2014-02-14 DIAGNOSIS — I10 Essential (primary) hypertension: Secondary | ICD-10-CM | POA: Diagnosis not present

## 2014-02-14 DIAGNOSIS — M5126 Other intervertebral disc displacement, lumbar region: Secondary | ICD-10-CM | POA: Diagnosis not present

## 2014-02-14 DIAGNOSIS — E785 Hyperlipidemia, unspecified: Secondary | ICD-10-CM | POA: Diagnosis not present

## 2014-02-14 DIAGNOSIS — E1142 Type 2 diabetes mellitus with diabetic polyneuropathy: Secondary | ICD-10-CM | POA: Diagnosis not present

## 2014-02-14 DIAGNOSIS — E1140 Type 2 diabetes mellitus with diabetic neuropathy, unspecified: Secondary | ICD-10-CM | POA: Diagnosis not present

## 2014-03-12 DIAGNOSIS — G894 Chronic pain syndrome: Secondary | ICD-10-CM | POA: Diagnosis not present

## 2014-03-12 DIAGNOSIS — M545 Low back pain: Secondary | ICD-10-CM | POA: Diagnosis not present

## 2014-03-12 DIAGNOSIS — M961 Postlaminectomy syndrome, not elsewhere classified: Secondary | ICD-10-CM | POA: Diagnosis not present

## 2014-03-27 DIAGNOSIS — E119 Type 2 diabetes mellitus without complications: Secondary | ICD-10-CM | POA: Diagnosis not present

## 2014-05-03 DIAGNOSIS — Z79899 Other long term (current) drug therapy: Secondary | ICD-10-CM | POA: Diagnosis not present

## 2014-05-03 DIAGNOSIS — Z01818 Encounter for other preprocedural examination: Secondary | ICD-10-CM | POA: Diagnosis not present

## 2014-05-03 DIAGNOSIS — R9431 Abnormal electrocardiogram [ECG] [EKG]: Secondary | ICD-10-CM | POA: Diagnosis not present

## 2014-05-03 DIAGNOSIS — M79609 Pain in unspecified limb: Secondary | ICD-10-CM | POA: Diagnosis not present

## 2014-05-23 DIAGNOSIS — Z791 Long term (current) use of non-steroidal anti-inflammatories (NSAID): Secondary | ICD-10-CM | POA: Diagnosis not present

## 2014-05-23 DIAGNOSIS — Z9889 Other specified postprocedural states: Secondary | ICD-10-CM | POA: Diagnosis not present

## 2014-05-23 DIAGNOSIS — Z8261 Family history of arthritis: Secondary | ICD-10-CM | POA: Diagnosis not present

## 2014-05-23 DIAGNOSIS — M1610 Unilateral primary osteoarthritis, unspecified hip: Secondary | ICD-10-CM | POA: Diagnosis not present

## 2014-05-23 DIAGNOSIS — Z79891 Long term (current) use of opiate analgesic: Secondary | ICD-10-CM | POA: Diagnosis not present

## 2014-05-23 DIAGNOSIS — Z96642 Presence of left artificial hip joint: Secondary | ICD-10-CM | POA: Diagnosis not present

## 2014-05-23 DIAGNOSIS — Z823 Family history of stroke: Secondary | ICD-10-CM | POA: Diagnosis not present

## 2014-05-23 DIAGNOSIS — Z79899 Other long term (current) drug therapy: Secondary | ICD-10-CM | POA: Diagnosis not present

## 2014-05-23 DIAGNOSIS — M1612 Unilateral primary osteoarthritis, left hip: Secondary | ICD-10-CM | POA: Diagnosis not present

## 2014-05-23 DIAGNOSIS — Z471 Aftercare following joint replacement surgery: Secondary | ICD-10-CM | POA: Diagnosis not present

## 2014-05-23 DIAGNOSIS — Z8249 Family history of ischemic heart disease and other diseases of the circulatory system: Secondary | ICD-10-CM | POA: Diagnosis not present

## 2014-05-23 DIAGNOSIS — Z833 Family history of diabetes mellitus: Secondary | ICD-10-CM | POA: Diagnosis not present

## 2014-05-23 DIAGNOSIS — Z7982 Long term (current) use of aspirin: Secondary | ICD-10-CM | POA: Diagnosis not present

## 2014-05-23 DIAGNOSIS — Z886 Allergy status to analgesic agent status: Secondary | ICD-10-CM | POA: Diagnosis not present

## 2014-05-23 DIAGNOSIS — Z6841 Body Mass Index (BMI) 40.0 and over, adult: Secondary | ICD-10-CM | POA: Diagnosis not present

## 2014-05-23 DIAGNOSIS — E119 Type 2 diabetes mellitus without complications: Secondary | ICD-10-CM | POA: Diagnosis not present

## 2014-06-06 DIAGNOSIS — Z96642 Presence of left artificial hip joint: Secondary | ICD-10-CM | POA: Diagnosis not present

## 2014-06-06 DIAGNOSIS — M25552 Pain in left hip: Secondary | ICD-10-CM | POA: Diagnosis not present

## 2014-06-06 DIAGNOSIS — R262 Difficulty in walking, not elsewhere classified: Secondary | ICD-10-CM | POA: Diagnosis not present

## 2014-07-03 DIAGNOSIS — M25552 Pain in left hip: Secondary | ICD-10-CM | POA: Diagnosis not present

## 2014-07-03 DIAGNOSIS — R262 Difficulty in walking, not elsewhere classified: Secondary | ICD-10-CM | POA: Diagnosis not present

## 2014-07-03 DIAGNOSIS — Z96642 Presence of left artificial hip joint: Secondary | ICD-10-CM | POA: Diagnosis not present

## 2014-07-05 DIAGNOSIS — Z96642 Presence of left artificial hip joint: Secondary | ICD-10-CM | POA: Diagnosis not present

## 2014-07-05 DIAGNOSIS — M25552 Pain in left hip: Secondary | ICD-10-CM | POA: Diagnosis not present

## 2014-07-05 DIAGNOSIS — R262 Difficulty in walking, not elsewhere classified: Secondary | ICD-10-CM | POA: Diagnosis not present

## 2015-03-05 DIAGNOSIS — M961 Postlaminectomy syndrome, not elsewhere classified: Secondary | ICD-10-CM | POA: Diagnosis not present

## 2015-03-05 DIAGNOSIS — G894 Chronic pain syndrome: Secondary | ICD-10-CM | POA: Diagnosis not present

## 2015-03-05 DIAGNOSIS — Z79891 Long term (current) use of opiate analgesic: Secondary | ICD-10-CM | POA: Diagnosis not present

## 2015-03-24 DIAGNOSIS — J0100 Acute maxillary sinusitis, unspecified: Secondary | ICD-10-CM | POA: Diagnosis not present

## 2015-04-02 DIAGNOSIS — H2513 Age-related nuclear cataract, bilateral: Secondary | ICD-10-CM | POA: Diagnosis not present

## 2015-04-02 DIAGNOSIS — E119 Type 2 diabetes mellitus without complications: Secondary | ICD-10-CM | POA: Diagnosis not present

## 2015-05-05 DIAGNOSIS — J0100 Acute maxillary sinusitis, unspecified: Secondary | ICD-10-CM | POA: Diagnosis not present

## 2015-05-10 DIAGNOSIS — Z6841 Body Mass Index (BMI) 40.0 and over, adult: Secondary | ICD-10-CM | POA: Diagnosis not present

## 2015-05-10 DIAGNOSIS — E785 Hyperlipidemia, unspecified: Secondary | ICD-10-CM | POA: Diagnosis not present

## 2015-05-10 DIAGNOSIS — S0990XD Unspecified injury of head, subsequent encounter: Secondary | ICD-10-CM | POA: Diagnosis not present

## 2015-05-10 DIAGNOSIS — E1149 Type 2 diabetes mellitus with other diabetic neurological complication: Secondary | ICD-10-CM | POA: Diagnosis not present

## 2015-05-10 DIAGNOSIS — I10 Essential (primary) hypertension: Secondary | ICD-10-CM | POA: Diagnosis not present

## 2015-05-10 DIAGNOSIS — K219 Gastro-esophageal reflux disease without esophagitis: Secondary | ICD-10-CM | POA: Diagnosis not present

## 2015-05-10 DIAGNOSIS — N401 Enlarged prostate with lower urinary tract symptoms: Secondary | ICD-10-CM | POA: Diagnosis not present

## 2015-05-10 DIAGNOSIS — E1142 Type 2 diabetes mellitus with diabetic polyneuropathy: Secondary | ICD-10-CM | POA: Diagnosis not present

## 2015-06-25 DIAGNOSIS — G894 Chronic pain syndrome: Secondary | ICD-10-CM | POA: Diagnosis not present

## 2015-06-25 DIAGNOSIS — M961 Postlaminectomy syndrome, not elsewhere classified: Secondary | ICD-10-CM | POA: Diagnosis not present

## 2015-06-25 DIAGNOSIS — Z79891 Long term (current) use of opiate analgesic: Secondary | ICD-10-CM | POA: Diagnosis not present

## 2015-09-08 DIAGNOSIS — L723 Sebaceous cyst: Secondary | ICD-10-CM | POA: Diagnosis not present

## 2015-09-16 DIAGNOSIS — M5126 Other intervertebral disc displacement, lumbar region: Secondary | ICD-10-CM | POA: Diagnosis not present

## 2015-09-16 DIAGNOSIS — E1142 Type 2 diabetes mellitus with diabetic polyneuropathy: Secondary | ICD-10-CM | POA: Diagnosis not present

## 2015-09-16 DIAGNOSIS — E1140 Type 2 diabetes mellitus with diabetic neuropathy, unspecified: Secondary | ICD-10-CM | POA: Diagnosis not present

## 2015-09-16 DIAGNOSIS — Z6841 Body Mass Index (BMI) 40.0 and over, adult: Secondary | ICD-10-CM | POA: Diagnosis not present

## 2015-09-16 DIAGNOSIS — K219 Gastro-esophageal reflux disease without esophagitis: Secondary | ICD-10-CM | POA: Diagnosis not present

## 2015-09-16 DIAGNOSIS — I10 Essential (primary) hypertension: Secondary | ICD-10-CM | POA: Diagnosis not present

## 2015-09-16 DIAGNOSIS — E785 Hyperlipidemia, unspecified: Secondary | ICD-10-CM | POA: Diagnosis not present

## 2015-09-16 DIAGNOSIS — E1149 Type 2 diabetes mellitus with other diabetic neurological complication: Secondary | ICD-10-CM | POA: Diagnosis not present

## 2015-09-16 DIAGNOSIS — S0990XA Unspecified injury of head, initial encounter: Secondary | ICD-10-CM | POA: Diagnosis not present

## 2015-09-16 DIAGNOSIS — F0390 Unspecified dementia without behavioral disturbance: Secondary | ICD-10-CM | POA: Diagnosis not present

## 2015-09-16 DIAGNOSIS — N401 Enlarged prostate with lower urinary tract symptoms: Secondary | ICD-10-CM | POA: Diagnosis not present

## 2015-09-16 DIAGNOSIS — F341 Dysthymic disorder: Secondary | ICD-10-CM | POA: Diagnosis not present

## 2015-10-05 DIAGNOSIS — L723 Sebaceous cyst: Secondary | ICD-10-CM | POA: Diagnosis not present

## 2015-10-10 DIAGNOSIS — Z79891 Long term (current) use of opiate analgesic: Secondary | ICD-10-CM | POA: Diagnosis not present

## 2015-10-10 DIAGNOSIS — G894 Chronic pain syndrome: Secondary | ICD-10-CM | POA: Diagnosis not present

## 2015-10-10 DIAGNOSIS — M961 Postlaminectomy syndrome, not elsewhere classified: Secondary | ICD-10-CM | POA: Diagnosis not present

## 2015-12-30 DIAGNOSIS — M7710 Lateral epicondylitis, unspecified elbow: Secondary | ICD-10-CM | POA: Diagnosis not present

## 2015-12-30 DIAGNOSIS — M19022 Primary osteoarthritis, left elbow: Secondary | ICD-10-CM | POA: Diagnosis not present

## 2016-01-06 DIAGNOSIS — M25522 Pain in left elbow: Secondary | ICD-10-CM | POA: Diagnosis not present

## 2016-01-06 DIAGNOSIS — M25422 Effusion, left elbow: Secondary | ICD-10-CM | POA: Diagnosis not present

## 2016-01-23 DIAGNOSIS — E119 Type 2 diabetes mellitus without complications: Secondary | ICD-10-CM | POA: Diagnosis not present

## 2016-01-23 DIAGNOSIS — E782 Mixed hyperlipidemia: Secondary | ICD-10-CM | POA: Diagnosis not present

## 2016-01-23 DIAGNOSIS — I10 Essential (primary) hypertension: Secondary | ICD-10-CM | POA: Diagnosis not present

## 2016-02-05 DIAGNOSIS — M19022 Primary osteoarthritis, left elbow: Secondary | ICD-10-CM | POA: Diagnosis not present

## 2016-02-05 DIAGNOSIS — M7712 Lateral epicondylitis, left elbow: Secondary | ICD-10-CM | POA: Diagnosis not present

## 2016-02-13 DIAGNOSIS — M961 Postlaminectomy syndrome, not elsewhere classified: Secondary | ICD-10-CM | POA: Diagnosis not present

## 2016-02-13 DIAGNOSIS — Z79891 Long term (current) use of opiate analgesic: Secondary | ICD-10-CM | POA: Diagnosis not present

## 2016-02-13 DIAGNOSIS — G894 Chronic pain syndrome: Secondary | ICD-10-CM | POA: Diagnosis not present

## 2016-03-12 ENCOUNTER — Ambulatory Visit: Payer: Commercial Managed Care - PPO | Admitting: Neurology

## 2016-04-02 DIAGNOSIS — E119 Type 2 diabetes mellitus without complications: Secondary | ICD-10-CM | POA: Diagnosis not present

## 2016-05-27 DIAGNOSIS — M1990 Unspecified osteoarthritis, unspecified site: Secondary | ICD-10-CM | POA: Diagnosis not present

## 2016-05-27 DIAGNOSIS — N401 Enlarged prostate with lower urinary tract symptoms: Secondary | ICD-10-CM | POA: Diagnosis not present

## 2016-05-27 DIAGNOSIS — E782 Mixed hyperlipidemia: Secondary | ICD-10-CM | POA: Diagnosis not present

## 2016-05-27 DIAGNOSIS — E119 Type 2 diabetes mellitus without complications: Secondary | ICD-10-CM | POA: Diagnosis not present

## 2016-05-27 DIAGNOSIS — K581 Irritable bowel syndrome with constipation: Secondary | ICD-10-CM | POA: Diagnosis not present

## 2016-05-27 DIAGNOSIS — I10 Essential (primary) hypertension: Secondary | ICD-10-CM | POA: Diagnosis not present

## 2016-06-03 DIAGNOSIS — Z01810 Encounter for preprocedural cardiovascular examination: Secondary | ICD-10-CM | POA: Diagnosis not present

## 2016-06-03 DIAGNOSIS — Z79899 Other long term (current) drug therapy: Secondary | ICD-10-CM | POA: Diagnosis not present

## 2016-06-03 DIAGNOSIS — R39198 Other difficulties with micturition: Secondary | ICD-10-CM | POA: Diagnosis not present

## 2016-06-03 DIAGNOSIS — K5909 Other constipation: Secondary | ICD-10-CM | POA: Diagnosis not present

## 2016-06-03 DIAGNOSIS — R351 Nocturia: Secondary | ICD-10-CM | POA: Diagnosis not present

## 2016-06-03 DIAGNOSIS — E6609 Other obesity due to excess calories: Secondary | ICD-10-CM | POA: Diagnosis not present

## 2016-06-03 DIAGNOSIS — Z7982 Long term (current) use of aspirin: Secondary | ICD-10-CM | POA: Diagnosis not present

## 2016-06-03 DIAGNOSIS — E119 Type 2 diabetes mellitus without complications: Secondary | ICD-10-CM | POA: Diagnosis not present

## 2016-06-03 DIAGNOSIS — N401 Enlarged prostate with lower urinary tract symptoms: Secondary | ICD-10-CM | POA: Diagnosis not present

## 2016-06-03 DIAGNOSIS — K219 Gastro-esophageal reflux disease without esophagitis: Secondary | ICD-10-CM | POA: Diagnosis not present

## 2016-06-03 DIAGNOSIS — E785 Hyperlipidemia, unspecified: Secondary | ICD-10-CM | POA: Diagnosis not present

## 2016-06-11 DIAGNOSIS — Z79891 Long term (current) use of opiate analgesic: Secondary | ICD-10-CM | POA: Diagnosis not present

## 2016-06-11 DIAGNOSIS — M961 Postlaminectomy syndrome, not elsewhere classified: Secondary | ICD-10-CM | POA: Diagnosis not present

## 2016-06-11 DIAGNOSIS — G894 Chronic pain syndrome: Secondary | ICD-10-CM | POA: Diagnosis not present

## 2016-07-16 DIAGNOSIS — G894 Chronic pain syndrome: Secondary | ICD-10-CM | POA: Diagnosis not present

## 2016-07-16 DIAGNOSIS — M961 Postlaminectomy syndrome, not elsewhere classified: Secondary | ICD-10-CM | POA: Diagnosis not present

## 2016-07-16 DIAGNOSIS — Z79891 Long term (current) use of opiate analgesic: Secondary | ICD-10-CM | POA: Diagnosis not present

## 2016-09-15 DIAGNOSIS — M7061 Trochanteric bursitis, right hip: Secondary | ICD-10-CM | POA: Diagnosis not present

## 2016-09-16 DIAGNOSIS — J0100 Acute maxillary sinusitis, unspecified: Secondary | ICD-10-CM | POA: Diagnosis not present

## 2016-09-16 DIAGNOSIS — J029 Acute pharyngitis, unspecified: Secondary | ICD-10-CM | POA: Diagnosis not present

## 2016-09-22 DIAGNOSIS — M25551 Pain in right hip: Secondary | ICD-10-CM | POA: Diagnosis not present

## 2016-09-22 DIAGNOSIS — M7061 Trochanteric bursitis, right hip: Secondary | ICD-10-CM | POA: Diagnosis not present

## 2016-09-22 DIAGNOSIS — M545 Low back pain: Secondary | ICD-10-CM | POA: Diagnosis not present

## 2016-09-25 DIAGNOSIS — M7061 Trochanteric bursitis, right hip: Secondary | ICD-10-CM | POA: Diagnosis not present

## 2016-09-25 DIAGNOSIS — M545 Low back pain: Secondary | ICD-10-CM | POA: Diagnosis not present

## 2016-09-25 DIAGNOSIS — M25551 Pain in right hip: Secondary | ICD-10-CM | POA: Diagnosis not present

## 2016-09-28 DIAGNOSIS — E119 Type 2 diabetes mellitus without complications: Secondary | ICD-10-CM | POA: Diagnosis not present

## 2016-09-28 DIAGNOSIS — K581 Irritable bowel syndrome with constipation: Secondary | ICD-10-CM | POA: Diagnosis not present

## 2016-09-28 DIAGNOSIS — E782 Mixed hyperlipidemia: Secondary | ICD-10-CM | POA: Diagnosis not present

## 2016-09-28 DIAGNOSIS — N401 Enlarged prostate with lower urinary tract symptoms: Secondary | ICD-10-CM | POA: Diagnosis not present

## 2016-09-28 DIAGNOSIS — I10 Essential (primary) hypertension: Secondary | ICD-10-CM | POA: Diagnosis not present

## 2016-09-28 DIAGNOSIS — M1990 Unspecified osteoarthritis, unspecified site: Secondary | ICD-10-CM | POA: Diagnosis not present

## 2016-09-29 DIAGNOSIS — M545 Low back pain: Secondary | ICD-10-CM | POA: Diagnosis not present

## 2016-09-29 DIAGNOSIS — M25551 Pain in right hip: Secondary | ICD-10-CM | POA: Diagnosis not present

## 2016-09-29 DIAGNOSIS — M7061 Trochanteric bursitis, right hip: Secondary | ICD-10-CM | POA: Diagnosis not present

## 2016-10-01 DIAGNOSIS — M25551 Pain in right hip: Secondary | ICD-10-CM | POA: Diagnosis not present

## 2016-10-01 DIAGNOSIS — M7061 Trochanteric bursitis, right hip: Secondary | ICD-10-CM | POA: Diagnosis not present

## 2016-10-01 DIAGNOSIS — M545 Low back pain: Secondary | ICD-10-CM | POA: Diagnosis not present

## 2016-10-27 DIAGNOSIS — M7061 Trochanteric bursitis, right hip: Secondary | ICD-10-CM | POA: Diagnosis not present

## 2016-11-23 DIAGNOSIS — G894 Chronic pain syndrome: Secondary | ICD-10-CM | POA: Diagnosis not present

## 2016-11-23 DIAGNOSIS — M961 Postlaminectomy syndrome, not elsewhere classified: Secondary | ICD-10-CM | POA: Diagnosis not present

## 2016-11-23 DIAGNOSIS — Z79891 Long term (current) use of opiate analgesic: Secondary | ICD-10-CM | POA: Diagnosis not present

## 2017-01-08 DIAGNOSIS — N401 Enlarged prostate with lower urinary tract symptoms: Secondary | ICD-10-CM | POA: Diagnosis not present

## 2017-01-08 DIAGNOSIS — N318 Other neuromuscular dysfunction of bladder: Secondary | ICD-10-CM | POA: Diagnosis not present

## 2017-01-08 DIAGNOSIS — N3020 Other chronic cystitis without hematuria: Secondary | ICD-10-CM | POA: Diagnosis not present

## 2017-02-03 DIAGNOSIS — I10 Essential (primary) hypertension: Secondary | ICD-10-CM | POA: Diagnosis not present

## 2017-02-03 DIAGNOSIS — E119 Type 2 diabetes mellitus without complications: Secondary | ICD-10-CM | POA: Diagnosis not present

## 2017-02-03 DIAGNOSIS — K581 Irritable bowel syndrome with constipation: Secondary | ICD-10-CM | POA: Diagnosis not present

## 2017-02-03 DIAGNOSIS — E782 Mixed hyperlipidemia: Secondary | ICD-10-CM | POA: Diagnosis not present

## 2017-02-03 DIAGNOSIS — F4320 Adjustment disorder, unspecified: Secondary | ICD-10-CM | POA: Diagnosis not present

## 2017-02-03 DIAGNOSIS — S062X9S Diffuse traumatic brain injury with loss of consciousness of unspecified duration, sequela: Secondary | ICD-10-CM | POA: Diagnosis not present

## 2017-02-03 DIAGNOSIS — Z23 Encounter for immunization: Secondary | ICD-10-CM | POA: Diagnosis not present

## 2017-02-03 DIAGNOSIS — N401 Enlarged prostate with lower urinary tract symptoms: Secondary | ICD-10-CM | POA: Diagnosis not present

## 2017-02-03 DIAGNOSIS — M1990 Unspecified osteoarthritis, unspecified site: Secondary | ICD-10-CM | POA: Diagnosis not present

## 2017-02-25 DIAGNOSIS — M5410 Radiculopathy, site unspecified: Secondary | ICD-10-CM | POA: Diagnosis not present

## 2017-02-25 DIAGNOSIS — Z96642 Presence of left artificial hip joint: Secondary | ICD-10-CM | POA: Diagnosis not present

## 2017-03-31 DIAGNOSIS — Z79891 Long term (current) use of opiate analgesic: Secondary | ICD-10-CM | POA: Diagnosis not present

## 2017-03-31 DIAGNOSIS — M5416 Radiculopathy, lumbar region: Secondary | ICD-10-CM | POA: Diagnosis not present

## 2017-03-31 DIAGNOSIS — M545 Low back pain: Secondary | ICD-10-CM | POA: Diagnosis not present

## 2017-03-31 DIAGNOSIS — G894 Chronic pain syndrome: Secondary | ICD-10-CM | POA: Diagnosis not present

## 2017-05-05 DIAGNOSIS — F4321 Adjustment disorder with depressed mood: Secondary | ICD-10-CM | POA: Diagnosis not present

## 2017-05-05 DIAGNOSIS — K581 Irritable bowel syndrome with constipation: Secondary | ICD-10-CM | POA: Diagnosis not present

## 2017-05-05 DIAGNOSIS — E119 Type 2 diabetes mellitus without complications: Secondary | ICD-10-CM | POA: Diagnosis not present

## 2017-05-05 DIAGNOSIS — S062X9S Diffuse traumatic brain injury with loss of consciousness of unspecified duration, sequela: Secondary | ICD-10-CM | POA: Diagnosis not present

## 2017-05-05 DIAGNOSIS — I10 Essential (primary) hypertension: Secondary | ICD-10-CM | POA: Diagnosis not present

## 2017-05-05 DIAGNOSIS — M1990 Unspecified osteoarthritis, unspecified site: Secondary | ICD-10-CM | POA: Diagnosis not present

## 2017-05-05 DIAGNOSIS — N401 Enlarged prostate with lower urinary tract symptoms: Secondary | ICD-10-CM | POA: Diagnosis not present

## 2017-05-05 DIAGNOSIS — E782 Mixed hyperlipidemia: Secondary | ICD-10-CM | POA: Diagnosis not present

## 2017-05-31 DIAGNOSIS — M7061 Trochanteric bursitis, right hip: Secondary | ICD-10-CM | POA: Diagnosis not present

## 2017-07-28 DIAGNOSIS — G894 Chronic pain syndrome: Secondary | ICD-10-CM | POA: Diagnosis not present

## 2017-07-28 DIAGNOSIS — M545 Low back pain: Secondary | ICD-10-CM | POA: Diagnosis not present

## 2017-08-31 DIAGNOSIS — N318 Other neuromuscular dysfunction of bladder: Secondary | ICD-10-CM | POA: Diagnosis not present

## 2017-08-31 DIAGNOSIS — N3020 Other chronic cystitis without hematuria: Secondary | ICD-10-CM | POA: Diagnosis not present

## 2017-08-31 DIAGNOSIS — N401 Enlarged prostate with lower urinary tract symptoms: Secondary | ICD-10-CM | POA: Diagnosis not present

## 2017-09-06 DIAGNOSIS — S062X9S Diffuse traumatic brain injury with loss of consciousness of unspecified duration, sequela: Secondary | ICD-10-CM | POA: Diagnosis not present

## 2017-09-06 DIAGNOSIS — K581 Irritable bowel syndrome with constipation: Secondary | ICD-10-CM | POA: Diagnosis not present

## 2017-09-06 DIAGNOSIS — E1142 Type 2 diabetes mellitus with diabetic polyneuropathy: Secondary | ICD-10-CM | POA: Diagnosis not present

## 2017-09-06 DIAGNOSIS — F4321 Adjustment disorder with depressed mood: Secondary | ICD-10-CM | POA: Diagnosis not present

## 2017-09-06 DIAGNOSIS — E782 Mixed hyperlipidemia: Secondary | ICD-10-CM | POA: Diagnosis not present

## 2017-09-06 DIAGNOSIS — I10 Essential (primary) hypertension: Secondary | ICD-10-CM | POA: Diagnosis not present

## 2017-09-06 DIAGNOSIS — N401 Enlarged prostate with lower urinary tract symptoms: Secondary | ICD-10-CM | POA: Diagnosis not present

## 2017-09-06 DIAGNOSIS — E1169 Type 2 diabetes mellitus with other specified complication: Secondary | ICD-10-CM | POA: Diagnosis not present

## 2017-11-24 DIAGNOSIS — M545 Low back pain: Secondary | ICD-10-CM | POA: Diagnosis not present

## 2017-11-24 DIAGNOSIS — M5416 Radiculopathy, lumbar region: Secondary | ICD-10-CM | POA: Diagnosis not present

## 2017-11-24 DIAGNOSIS — G894 Chronic pain syndrome: Secondary | ICD-10-CM | POA: Diagnosis not present

## 2017-11-24 DIAGNOSIS — Z79899 Other long term (current) drug therapy: Secondary | ICD-10-CM | POA: Diagnosis not present

## 2017-11-24 DIAGNOSIS — Z79891 Long term (current) use of opiate analgesic: Secondary | ICD-10-CM | POA: Diagnosis not present

## 2018-02-25 DIAGNOSIS — N410 Acute prostatitis: Secondary | ICD-10-CM | POA: Diagnosis not present

## 2018-02-25 DIAGNOSIS — N3020 Other chronic cystitis without hematuria: Secondary | ICD-10-CM | POA: Diagnosis not present

## 2018-03-15 ENCOUNTER — Other Ambulatory Visit: Payer: Self-pay

## 2018-03-15 NOTE — Patient Outreach (Signed)
  03/15/2018  MATAS BURROWS May 02, 1960 [id number]   Medication Adherence call to Mr. Ozil Stettler HIPPA identifier Verify spoke with patient he explain he is no longer taking Pravastatin 40 mg he is now taking Rosuvastatin 10 mg he has medication at this time but prefers to received a 90 days supply. Mr. Kauffmann said his wife past away not to long ago ,patient have many question on all his medications on what the medications are for.Benjamine Mola [facility]  will answer this question for patient to expect a call from her. I  left a message at doctor office to send in a prescription for a 90 days supply.    [place] Management Direct Dial [phone]  Fax [phone] [email or web address]

## 2018-03-17 DIAGNOSIS — Z550 Illiteracy and low-level literacy: Secondary | ICD-10-CM | POA: Diagnosis not present

## 2018-03-17 DIAGNOSIS — K581 Irritable bowel syndrome with constipation: Secondary | ICD-10-CM | POA: Diagnosis not present

## 2018-03-17 DIAGNOSIS — N401 Enlarged prostate with lower urinary tract symptoms: Secondary | ICD-10-CM | POA: Diagnosis not present

## 2018-03-17 DIAGNOSIS — E1169 Type 2 diabetes mellitus with other specified complication: Secondary | ICD-10-CM | POA: Diagnosis not present

## 2018-03-17 DIAGNOSIS — I10 Essential (primary) hypertension: Secondary | ICD-10-CM | POA: Diagnosis not present

## 2018-03-17 DIAGNOSIS — E1142 Type 2 diabetes mellitus with diabetic polyneuropathy: Secondary | ICD-10-CM | POA: Diagnosis not present

## 2018-03-17 DIAGNOSIS — F4321 Adjustment disorder with depressed mood: Secondary | ICD-10-CM | POA: Diagnosis not present

## 2018-03-17 DIAGNOSIS — E782 Mixed hyperlipidemia: Secondary | ICD-10-CM | POA: Diagnosis not present

## 2018-03-17 DIAGNOSIS — S062X9S Diffuse traumatic brain injury with loss of consciousness of unspecified duration, sequela: Secondary | ICD-10-CM | POA: Diagnosis not present

## 2018-03-18 ENCOUNTER — Other Ambulatory Visit: Payer: Self-pay | Admitting: Pharmacist

## 2018-03-18 NOTE — Patient Outreach (Signed)
[place] [facility]) Care Management  [place]   03/18/2018  Brad Johnson 06/01/1960 [id number]  [place] Medication: rosuvastatin 10 mg Current insurance:[employer]   Outreach:  Receive an [place] message from [place] asking me to call the patient regarding his medication adherence and medication questions. Call patient, but no answer and patient's voicemail is not currently setup.  Will attempt outreach to [name] again next week.   Harlow Asa, PharmD, [place] Management [phone]

## 2018-03-23 DIAGNOSIS — G894 Chronic pain syndrome: Secondary | ICD-10-CM | POA: Diagnosis not present

## 2018-03-23 DIAGNOSIS — Z79891 Long term (current) use of opiate analgesic: Secondary | ICD-10-CM | POA: Diagnosis not present

## 2018-03-23 DIAGNOSIS — M545 Low back pain: Secondary | ICD-10-CM | POA: Diagnosis not present

## 2018-03-23 DIAGNOSIS — M5416 Radiculopathy, lumbar region: Secondary | ICD-10-CM | POA: Diagnosis not present

## 2018-03-25 ENCOUNTER — Other Ambulatory Visit: Payer: Self-pay | Admitting: Pharmacist

## 2018-03-25 NOTE — Patient Outreach (Signed)
[place] [facility]) Care Management  [place]   03/25/2018  REES SANTISTEVAN [birth year] [id number]  [place] Medication: rosuvastatin 10 mg Current insurance:[employer]vantage   Outreach:  Received an Google from [place] asking me to call the patient regarding his medication adherence and medication questions. Call patient, but no answer and patient's voicemail is not currently setup. Outreach attempt #2.  Will attempt outreach to Mr. Portales again next week.   Harlow Asa, PharmD, [place] Management [phone]

## 2018-04-04 ENCOUNTER — Other Ambulatory Visit: Payer: Self-pay | Admitting: Pharmacist

## 2018-04-04 DIAGNOSIS — N3281 Overactive bladder: Secondary | ICD-10-CM | POA: Diagnosis not present

## 2018-04-04 DIAGNOSIS — N410 Acute prostatitis: Secondary | ICD-10-CM | POA: Diagnosis not present

## 2018-04-04 NOTE — Patient Outreach (Signed)
[place] [facility]) Care Management  [place]   04/04/2018  KENNIE SNEDDEN 02-02-1961 [id number]  [place] Medication: rosuvastatin 10 mg Current insurance:[employer] Advantage   Outreach:  Received an Google from [place] asking me to call the patient regarding his medication adherence and medication questions. Call again and speak with patient. HIPAA identifiers verified.  Mr. Heitz reports that he had questions about side effects of his Vania Rea, but that he has now addressed this with his PCP. Reports that he has now been taken off of the Jardiance because he had multiple urinary tract infections since being on it. Reports that his PCP instructed that he will not have the patient start a new medication at this time, but rather continue to monitor his blood sugar and keep a log until his follow up appointment. Patient reports that his morning fasting blood sugars have been running between 90- 130 mg/dL since stopping the medication and that PCP is aware. Reports that his blood sugars had been higher in the past when he was receiving regular steroid injections for his back pain, but that he has stopped these now. Encourage patient to continue to keep a log of his blood sugar and to follow up with PCP sooner if he notices these blood sugar levels increasing.   Patient denies any barriers to taking his medications. Counsel on the importance of medication adherence. Reports that he has a good system for organizing his medications, including using a weekly pillbox.  Patient denies any further medication questions/concerns at this time. However, explain [facility] Care Management services to the patient and he expresses interest in speaking with a [place] about his diabetes management and diet. Also provide patient with my phone number.  PLAN  1) Will send a referral to [place].  2) Will close pharmacy episode at this time.   Harlow Asa, PharmD, [place] Management [phone]

## 2018-04-13 ENCOUNTER — Other Ambulatory Visit: Payer: Self-pay | Admitting: *Deleted

## 2018-04-13 NOTE — Patient Outreach (Signed)
[facility]) Care Management  04/13/2018  Brad Johnson May 11, 1960 [id number]   Brad Johnson Introduction Call  Referral Date:  04/04/2018 Referral Source:  [place] Reason for Referral:  Disease Management Education Insurance:  [employer]   Outreach Attempt:  Successful telephone outreach to patient for introductory call.  HIPAA verified with patient.  RN Health Coach introduced self and role. Patient verbally agrees to Disease Management outreaches.  Requesting to complete initial telephone assessment with his sons assistance at a later date.  Plan: RN Health Coach will send patient Health Coach Letter. RN Health Coach will attempt outreach to complete initial telephone assessment with in the month of March when son is available for assistance.  [place] [phone] [email or web address]

## 2018-04-18 DIAGNOSIS — L0390 Cellulitis, unspecified: Secondary | ICD-10-CM | POA: Diagnosis not present

## 2018-04-19 DIAGNOSIS — N410 Acute prostatitis: Secondary | ICD-10-CM | POA: Diagnosis not present

## 2018-04-19 DIAGNOSIS — N3281 Overactive bladder: Secondary | ICD-10-CM | POA: Diagnosis not present

## 2018-04-19 DIAGNOSIS — N3020 Other chronic cystitis without hematuria: Secondary | ICD-10-CM | POA: Diagnosis not present

## 2018-04-26 ENCOUNTER — Other Ambulatory Visit: Payer: Self-pay | Admitting: *Deleted

## 2018-04-26 NOTE — Patient Outreach (Signed)
[place] [facility]) Care Management  04/26/2018  Brad Johnson 01-22-1961 [id number]   RN Health Coach Initial Assessment  Referral Date:  04/04/2018 Referral Source:  [place] Reason for Referral:  Disease Management Education Insurance:  [employer]   Outreach Attempt:  Outreach attempt #1 to patient for initial telephone assessment. No answer and unable to leave voicemail message due to voicemail not set up.  Plan:  RN Health Coach will make another outreach attempt within the month of April.   [place] [phone] [email or web address]

## 2018-05-10 ENCOUNTER — Other Ambulatory Visit: Payer: Self-pay | Admitting: *Deleted

## 2018-05-10 NOTE — Patient Outreach (Addendum)
[place]) Care Management  05/10/2018  Brad Johnson 1960/03/08 [id number]   [place] Initial Assessment  Referral Date:04/04/2018 Referral Source:[facility] Reason for Referral:Disease Management Education Insurance:[employer]age   Outreach Attempt:  Outreach attempt #2 to patient for introduction and initial telephone assessment. No answer and unable to leave voicemail message due to voicemail not engaging.  Plan:  RN Health Coach will send Unsuccessful Outreach Letter.  RN Health Coach will make next telephone outreach within the month of May.  [place] [phone] [email or web address]

## 2018-05-20 DIAGNOSIS — N318 Other neuromuscular dysfunction of bladder: Secondary | ICD-10-CM | POA: Diagnosis not present

## 2018-05-20 DIAGNOSIS — N3020 Other chronic cystitis without hematuria: Secondary | ICD-10-CM | POA: Diagnosis not present

## 2018-05-20 DIAGNOSIS — N401 Enlarged prostate with lower urinary tract symptoms: Secondary | ICD-10-CM | POA: Diagnosis not present

## 2018-05-24 DIAGNOSIS — M7732 Calcaneal spur, left foot: Secondary | ICD-10-CM | POA: Diagnosis not present

## 2018-05-24 DIAGNOSIS — M25872 Other specified joint disorders, left ankle and foot: Secondary | ICD-10-CM | POA: Diagnosis not present

## 2018-05-24 DIAGNOSIS — M7662 Achilles tendinitis, left leg: Secondary | ICD-10-CM | POA: Diagnosis not present

## 2018-05-24 DIAGNOSIS — M25572 Pain in left ankle and joints of left foot: Secondary | ICD-10-CM | POA: Diagnosis not present

## 2018-05-24 DIAGNOSIS — G8929 Other chronic pain: Secondary | ICD-10-CM | POA: Diagnosis not present

## 2018-05-24 DIAGNOSIS — N401 Enlarged prostate with lower urinary tract symptoms: Secondary | ICD-10-CM | POA: Diagnosis not present

## 2018-05-24 DIAGNOSIS — Z6841 Body Mass Index (BMI) 40.0 and over, adult: Secondary | ICD-10-CM | POA: Diagnosis not present

## 2018-05-24 DIAGNOSIS — Z1159 Encounter for screening for other viral diseases: Secondary | ICD-10-CM | POA: Diagnosis not present

## 2018-05-26 DIAGNOSIS — M7660 Achilles tendinitis, unspecified leg: Secondary | ICD-10-CM | POA: Diagnosis not present

## 2018-06-03 ENCOUNTER — Other Ambulatory Visit: Payer: Self-pay | Admitting: *Deleted
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
## 2018-06-07 DIAGNOSIS — Z1159 Encounter for screening for other viral diseases: Secondary | ICD-10-CM | POA: Diagnosis not present

## 2018-06-07 DIAGNOSIS — M7662 Achilles tendinitis, left leg: Secondary | ICD-10-CM | POA: Diagnosis not present

## 2018-06-08 ENCOUNTER — Other Ambulatory Visit: Payer: Self-pay | Admitting: *Deleted

## 2018-06-08 ENCOUNTER — Encounter: Payer: Self-pay | Admitting: *Deleted

## 2018-06-08 DIAGNOSIS — G629 Polyneuropathy, unspecified: Secondary | ICD-10-CM | POA: Insufficient documentation

## 2018-06-08 DIAGNOSIS — N138 Other obstructive and reflux uropathy: Secondary | ICD-10-CM

## 2018-06-08 DIAGNOSIS — F341 Dysthymic disorder: Secondary | ICD-10-CM | POA: Insufficient documentation

## 2018-06-08 DIAGNOSIS — K589 Irritable bowel syndrome without diarrhea: Secondary | ICD-10-CM

## 2018-06-08 DIAGNOSIS — M5126 Other intervertebral disc displacement, lumbar region: Secondary | ICD-10-CM

## 2018-06-08 DIAGNOSIS — N400 Enlarged prostate without lower urinary tract symptoms: Secondary | ICD-10-CM

## 2018-06-08 DIAGNOSIS — M1990 Unspecified osteoarthritis, unspecified site: Secondary | ICD-10-CM | POA: Insufficient documentation

## 2018-06-08 DIAGNOSIS — N401 Enlarged prostate with lower urinary tract symptoms: Secondary | ICD-10-CM | POA: Insufficient documentation

## 2018-06-08 DIAGNOSIS — E782 Mixed hyperlipidemia: Secondary | ICD-10-CM

## 2018-06-08 DIAGNOSIS — K219 Gastro-esophageal reflux disease without esophagitis: Secondary | ICD-10-CM

## 2018-06-08 DIAGNOSIS — F0390 Unspecified dementia without behavioral disturbance: Secondary | ICD-10-CM

## 2018-06-08 DIAGNOSIS — K581 Irritable bowel syndrome with constipation: Secondary | ICD-10-CM

## 2018-06-08 DIAGNOSIS — I10 Essential (primary) hypertension: Secondary | ICD-10-CM | POA: Insufficient documentation

## 2018-06-08 DIAGNOSIS — E119 Type 2 diabetes mellitus without complications: Secondary | ICD-10-CM | POA: Insufficient documentation

## 2018-06-08 DIAGNOSIS — Z96642 Presence of left artificial hip joint: Secondary | ICD-10-CM

## 2018-06-08 DIAGNOSIS — M7061 Trochanteric bursitis, right hip: Secondary | ICD-10-CM | POA: Insufficient documentation

## 2018-06-08 DIAGNOSIS — M19022 Primary osteoarthritis, left elbow: Secondary | ICD-10-CM

## 2018-06-08 HISTORY — DX: Essential (primary) hypertension: I10

## 2018-06-08 HISTORY — DX: Other intervertebral disc displacement, lumbar region: M51.26

## 2018-06-08 HISTORY — DX: Presence of left artificial hip joint: Z96.642

## 2018-06-08 HISTORY — DX: Gastro-esophageal reflux disease without esophagitis: K21.9

## 2018-06-08 HISTORY — DX: Irritable bowel syndrome, unspecified: K58.9

## 2018-06-08 HISTORY — DX: Unspecified dementia, unspecified severity, without behavioral disturbance, psychotic disturbance, mood disturbance, and anxiety: F03.90

## 2018-06-08 HISTORY — DX: Unspecified osteoarthritis, unspecified site: M19.90

## 2018-06-08 HISTORY — DX: Dysthymic disorder: F34.1

## 2018-06-08 HISTORY — DX: Benign prostatic hyperplasia with lower urinary tract symptoms: N13.8

## 2018-06-08 HISTORY — DX: Polyneuropathy, unspecified: G62.9

## 2018-06-08 HISTORY — DX: Mixed hyperlipidemia: E78.2

## 2018-06-08 HISTORY — DX: Trochanteric bursitis, right hip: M70.61
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
## 2018-07-15 DIAGNOSIS — E1169 Type 2 diabetes mellitus with other specified complication: Secondary | ICD-10-CM | POA: Diagnosis not present

## 2018-07-15 DIAGNOSIS — Z6841 Body Mass Index (BMI) 40.0 and over, adult: Secondary | ICD-10-CM | POA: Diagnosis not present

## 2018-07-15 DIAGNOSIS — M7662 Achilles tendinitis, left leg: Secondary | ICD-10-CM | POA: Diagnosis not present

## 2018-07-15 DIAGNOSIS — I10 Essential (primary) hypertension: Secondary | ICD-10-CM | POA: Diagnosis not present

## 2018-07-15 DIAGNOSIS — K581 Irritable bowel syndrome with constipation: Secondary | ICD-10-CM | POA: Diagnosis not present

## 2018-07-15 DIAGNOSIS — E782 Mixed hyperlipidemia: Secondary | ICD-10-CM | POA: Diagnosis not present

## 2018-07-15 DIAGNOSIS — N401 Enlarged prostate with lower urinary tract symptoms: Secondary | ICD-10-CM | POA: Diagnosis not present

## 2018-07-15 DIAGNOSIS — S062X9S Diffuse traumatic brain injury with loss of consciousness of unspecified duration, sequela: Secondary | ICD-10-CM | POA: Diagnosis not present

## 2018-07-15 DIAGNOSIS — F4321 Adjustment disorder with depressed mood: Secondary | ICD-10-CM | POA: Diagnosis not present

## 2018-07-15 DIAGNOSIS — E1142 Type 2 diabetes mellitus with diabetic polyneuropathy: Secondary | ICD-10-CM | POA: Diagnosis not present

## 2018-07-15 DIAGNOSIS — Z1159 Encounter for screening for other viral diseases: Secondary | ICD-10-CM | POA: Diagnosis not present

## 2018-07-20 DIAGNOSIS — M545 Low back pain: Secondary | ICD-10-CM | POA: Diagnosis not present

## 2018-07-20 DIAGNOSIS — Z79891 Long term (current) use of opiate analgesic: Secondary | ICD-10-CM | POA: Diagnosis not present

## 2018-07-29 ENCOUNTER — Other Ambulatory Visit: Payer: Self-pay | Admitting: *Deleted

## 2018-07-29 NOTE — Patient Outreach (Signed)
[place] [facility]) Care Management  07/29/2018  Brad Johnson 09/30/1960 [id number]   Case Closure/Transition to [facility] Health/CCI  Referral Date:04/04/2018 Referral Source:[facility] Reason for Referral:Disease Management Education Insurance:[employer] Ad[employer]age   Outreach:  Patient case has been transitioned to [facility] Health/CCI for further Care Management assistance.  Plan: RN Health Coach will close case at this time. RN Health Coach will send primary care provider Care Management Case Closure Letter. RN Health Coach will make patient inactive with [facility] Care Management at this time.  [place] Coach [phone] [name]

## 2018-08-11 ENCOUNTER — Ambulatory Visit: Payer: PPO | Admitting: *Deleted

## 2018-09-06 DIAGNOSIS — N401 Enlarged prostate with lower urinary tract symptoms: Secondary | ICD-10-CM | POA: Diagnosis not present

## 2018-09-06 DIAGNOSIS — N318 Other neuromuscular dysfunction of bladder: Secondary | ICD-10-CM | POA: Diagnosis not present

## 2018-09-06 DIAGNOSIS — N3020 Other chronic cystitis without hematuria: Secondary | ICD-10-CM | POA: Diagnosis not present

## 2018-11-14 DIAGNOSIS — N401 Enlarged prostate with lower urinary tract symptoms: Secondary | ICD-10-CM | POA: Diagnosis not present

## 2018-11-14 DIAGNOSIS — G8929 Other chronic pain: Secondary | ICD-10-CM | POA: Diagnosis not present

## 2018-11-14 DIAGNOSIS — Z1159 Encounter for screening for other viral diseases: Secondary | ICD-10-CM | POA: Diagnosis not present

## 2018-11-14 DIAGNOSIS — Z6841 Body Mass Index (BMI) 40.0 and over, adult: Secondary | ICD-10-CM | POA: Diagnosis not present

## 2018-11-14 DIAGNOSIS — E1169 Type 2 diabetes mellitus with other specified complication: Secondary | ICD-10-CM | POA: Diagnosis not present

## 2018-11-14 DIAGNOSIS — E782 Mixed hyperlipidemia: Secondary | ICD-10-CM | POA: Diagnosis not present

## 2018-11-14 DIAGNOSIS — S062X9S Diffuse traumatic brain injury with loss of consciousness of unspecified duration, sequela: Secondary | ICD-10-CM | POA: Diagnosis not present

## 2018-11-14 DIAGNOSIS — E1142 Type 2 diabetes mellitus with diabetic polyneuropathy: Secondary | ICD-10-CM | POA: Diagnosis not present

## 2018-11-14 DIAGNOSIS — I10 Essential (primary) hypertension: Secondary | ICD-10-CM | POA: Diagnosis not present

## 2018-11-14 DIAGNOSIS — Z23 Encounter for immunization: Secondary | ICD-10-CM | POA: Diagnosis not present

## 2018-11-16 DIAGNOSIS — E119 Type 2 diabetes mellitus without complications: Secondary | ICD-10-CM | POA: Diagnosis not present

## 2019-01-13 DIAGNOSIS — Z79891 Long term (current) use of opiate analgesic: Secondary | ICD-10-CM | POA: Diagnosis not present

## 2019-01-13 DIAGNOSIS — Z5181 Encounter for therapeutic drug level monitoring: Secondary | ICD-10-CM | POA: Diagnosis not present

## 2019-01-13 DIAGNOSIS — Z79899 Other long term (current) drug therapy: Secondary | ICD-10-CM | POA: Diagnosis not present

## 2019-01-13 DIAGNOSIS — G894 Chronic pain syndrome: Secondary | ICD-10-CM | POA: Diagnosis not present

## 2019-02-28 DIAGNOSIS — Z96642 Presence of left artificial hip joint: Secondary | ICD-10-CM | POA: Diagnosis not present

## 2019-03-07 DIAGNOSIS — N3020 Other chronic cystitis without hematuria: Secondary | ICD-10-CM | POA: Diagnosis not present

## 2019-03-07 DIAGNOSIS — N318 Other neuromuscular dysfunction of bladder: Secondary | ICD-10-CM | POA: Diagnosis not present

## 2019-03-07 DIAGNOSIS — N411 Chronic prostatitis: Secondary | ICD-10-CM | POA: Diagnosis not present

## 2019-03-07 DIAGNOSIS — N401 Enlarged prostate with lower urinary tract symptoms: Secondary | ICD-10-CM | POA: Diagnosis not present

## 2019-03-19 DIAGNOSIS — Z79891 Long term (current) use of opiate analgesic: Secondary | ICD-10-CM

## 2019-03-19 DIAGNOSIS — Z550 Illiteracy and low-level literacy: Secondary | ICD-10-CM

## 2019-03-19 DIAGNOSIS — F4321 Adjustment disorder with depressed mood: Secondary | ICD-10-CM

## 2019-03-19 DIAGNOSIS — S0990XS Unspecified injury of head, sequela: Secondary | ICD-10-CM

## 2019-03-19 DIAGNOSIS — E1142 Type 2 diabetes mellitus with diabetic polyneuropathy: Secondary | ICD-10-CM | POA: Insufficient documentation

## 2019-03-19 HISTORY — DX: Adjustment disorder with depressed mood: F43.21

## 2019-03-19 HISTORY — DX: Unspecified injury of head, sequela: S09.90XS

## 2019-03-19 HISTORY — DX: Type 2 diabetes mellitus with diabetic polyneuropathy: E11.42

## 2019-03-19 HISTORY — DX: Illiteracy and low-level literacy: Z55.0

## 2019-03-19 HISTORY — DX: Long term (current) use of opiate analgesic: Z79.891

## 2019-03-20 ENCOUNTER — Other Ambulatory Visit: Payer: Self-pay

## 2019-03-20 ENCOUNTER — Encounter: Payer: Self-pay | Admitting: Legal Medicine

## 2019-03-20 ENCOUNTER — Ambulatory Visit (INDEPENDENT_AMBULATORY_CARE_PROVIDER_SITE_OTHER): Payer: PPO | Admitting: Legal Medicine

## 2019-03-20 VITALS — BP 138/80 | HR 68 | Temp 98.5°F | Resp 16 | Ht 72.0 in | Wt 322.2 lb

## 2019-03-20 DIAGNOSIS — E782 Mixed hyperlipidemia: Secondary | ICD-10-CM

## 2019-03-20 DIAGNOSIS — S0990XS Unspecified injury of head, sequela: Secondary | ICD-10-CM | POA: Diagnosis not present

## 2019-03-20 DIAGNOSIS — E1142 Type 2 diabetes mellitus with diabetic polyneuropathy: Secondary | ICD-10-CM

## 2019-03-20 DIAGNOSIS — F4321 Adjustment disorder with depressed mood: Secondary | ICD-10-CM | POA: Diagnosis not present

## 2019-03-20 DIAGNOSIS — Z79891 Long term (current) use of opiate analgesic: Secondary | ICD-10-CM

## 2019-03-20 DIAGNOSIS — I10 Essential (primary) hypertension: Secondary | ICD-10-CM

## 2019-03-20 MED ORDER — LISINOPRIL 10 MG PO TABS: 10.0000 mg | ORAL_TABLET | Freq: Every day | ORAL | 2 refills | Status: DC

## 2019-03-20 NOTE — Assessment & Plan Note (Signed)
AN INDIVIDUAL CARE PLAN was established and reinforced today.  The patient's status was assessed using clinical findings on exam, labs, and other diagnostic testing. Patient's success at meeting treatment goals based on disease specific evidence-bassed guidelines and found to be in good control. RECOMMENDATIONS include maintaining present medicines and treatment. 

## 2019-03-20 NOTE — Assessment & Plan Note (Signed)
An individual care plan was established and reinforced today.  The patient's status was assessed using clinical findings on exam, labs and diagnostic testing. Patient success at meeting goals based on disease specific evidence-based guidelines and found to be good controlled. Medications were assessed and patient's understanding of the medical issues , including barriers were assessed. Recommend adherence to a diabetic diet, a graduated exercise program, HgbA1c level is checked quarterly, and urine microalbumin performed yearly .  Annual mono-filament sensation testing performed. Lower blood pressure and control hyperlipidemia is important. Get annual eye exams and annual flu shots and smoking cessation discussed.  Self management goals were discussed. Jaurdiance added back

## 2019-03-20 NOTE — Progress Notes (Signed)
Established Patient Office Visit  Subjective:  Patient ID: Brad Johnson, male    DOB: Jul 28, 1960  Age: 59 y.o. MRN: [id number]  CC:  Chief Complaint  Patient presents with  . Diabetes  . Hyperlipidemia  . Hypertension    HPI Brad Johnson presents for Chronic visit.  Patient present with type 2 diabetes.  Specifically, this is type 2, no insulin requiring diabetes, complicated by polyneuopathy.  Compliance with treatment has been good; patient take medicines as directed, maintains diet and exercise regimen, follows up as directed, and is keeping glucose diary.  Date of  diagnosis 2018.  Depression screen has been performed.Tobacco screen nonsmoker. Current medicines for diabetes .  Patient is on lisinopril for renal protection and rosuvastatin for cholesterol control.  Patient performs foot exams daily and last ophthalmologic exam was 8 months ago.  Cut furosemide to PRN and restart jardiance..BS 150  Patient presents for follow up of hypertension.  Patient tolerating lisinopril well with side effects.  Patient was diagnosed with hypertension 2010 so has been treated for hypertension for 10 years.Patient is working on maintaining diet and exercise regimen and follows up as directed. Complication include none.  Patient presents with hyperlipidemia.  Compliance with treatment has been good; patient takes medicines as directed, maintains low cholesterol diet, follows up as directed, and maintains exercise regimen.  Patient is using rosuvastatin without problems.  Past Medical History:  Diagnosis Date  . Arthritis   . Depression   . Diabetes mellitus without complication ([place])   . Hyperlipidemia   . Prostate enlargement     Past Surgical History:  Procedure Laterality Date  . CYSTOSCOPY WITH INSERTION OF UROLIFT    . FRACTURE SURGERY    . SPINE SURGERY    . TOTAL HIP ARTHROPLASTY      History reviewed. No pertinent family history.  Social History   Socioeconomic History    . Marital status: Widowed    Spouse name: Not on file  . Number of children: Not on file  . Years of education: Not on file  . Highest education level: Not on file  Occupational History  . Not on file  Tobacco Use  . Smoking status: Never Smoker  . Smokeless tobacco: Never Used  Substance and Sexual Activity  . Alcohol use: Never  . Drug use: Never  . Sexual activity: Not on file  Other Topics Concern  . Not on file  Social History Narrative  . Not on file   Social Determinants of Health   Financial Resource Strain:   . Difficulty of Paying Living Expenses: Not on file  Food Insecurity:   . Worried About Charity fundraiser in the Last Year: Not on file  . Ran Out of Food in the Last Year: Not on file  Transportation Needs:   . Lack of Transportation (Medical): Not on file  . Lack of Transportation (Non-Medical): Not on file  Physical Activity:   . Days of Exercise per Week: Not on file  . Minutes of Exercise per Session: Not on file  Stress:   . Feeling of Stress : Not on file  Social Connections:   . Frequency of Communication with Friends and Family: Not on file  . Frequency of Social Gatherings with Friends and Family: Not on file  . Attends Religious Services: Not on file  . Active Member of Clubs or Organizations: Not on file  . Attends Archivist Meetings: Not on file  . Marital  Status: Not on file  Intimate Partner Violence:   . Fear of Current or Ex-Partner: Not on file  . Emotionally Abused: Not on file  . Physically Abused: Not on file  . Sexually Abused: Not on file    Outpatient Medications Prior to Visit  Medication Sig Dispense Refill  . ALPRAZolam (XANAX) 0.5 MG tablet daily as needed.    Marland Kitchen aspirin EC 81 MG tablet Take 81 mg by mouth daily.    . celecoxib (CELEBREX) 200 MG capsule daily.    Marland Kitchen docusate sodium (COLACE) 100 MG capsule Take 100 mg by mouth 2 (two) times daily.    . furosemide (LASIX) 20 MG tablet Take 20 mg by mouth once  as needed.    . gabapentin (NEURONTIN) 600 MG tablet 3 (three) times daily.    Marland Kitchen ibuprofen (ADVIL) 600 MG tablet 3 (three) times daily.    Marland Kitchen LINZESS 145 MCG CAPS capsule daily.    Marland Kitchen loratadine (CLARITIN) 10 MG tablet Take 10 mg by mouth daily as needed for allergies.    . Omega-3 Fatty Acids (FISH OIL) 1000 MG CAPS Take 1 capsule by mouth daily.    Marland Kitchen omeprazole (PRILOSEC) 20 MG capsule daily.    Marland Kitchen oxyCODONE (ROXICODONE) 15 MG immediate release tablet Take 15 mg by mouth every 4 (four) hours as needed.    . rosuvastatin (CRESTOR) 10 MG tablet Take 10 mg by mouth daily.    . tamsulosin (FLOMAX) 0.4 MG CAPS capsule Take 0.4 mg by mouth daily.    Marland Kitchen lisinopril (ZESTRIL) 10 MG tablet Take 10 mg by mouth daily.    . bethanechol (URECHOLINE) 25 MG tablet Take 25 mg by mouth daily.    Marland Kitchen JARDIANCE 10 MG TABS tablet daily.     No facility-administered medications prior to visit.    No Known Allergies  ROS Review of Systems  Constitutional: Negative.   HENT: Negative.   Eyes: Negative.   Respiratory: Negative.   Cardiovascular: Negative.   Gastrointestinal: Negative.   Endocrine: Positive for polyuria.  Genitourinary: Negative.   Musculoskeletal: Positive for back pain.  Skin: Negative.   Allergic/Immunologic: Negative.   Neurological: Positive for numbness.  Psychiatric/Behavioral: Negative.       Objective:    Physical Exam  Constitutional: He is oriented to person, place, and time. He appears well-developed and well-nourished.  HENT:  Head: Normocephalic and atraumatic.  Eyes: Pupils are equal, round, and reactive to light. Conjunctivae and EOM are normal.  Cardiovascular: Normal rate, regular rhythm and normal heart sounds.  Pulmonary/Chest: Effort normal and breath sounds normal.  Abdominal: Soft. Bowel sounds are normal.  Musculoskeletal:        General: Tenderness present.     Cervical back: Normal range of motion.     Comments: back  Neurological: He is alert and  oriented to person, place, and time. He has normal reflexes.  Skin: Skin is warm and dry.  Psychiatric: He has a normal mood and affect.  Vitals reviewed.   BP 138/80   Pulse 68   Temp 98.5 F (36.9 C)   Resp 16   Ht 6' (1.829 m)   Wt (!) 322 lb 3.2 oz (146.1 kg)   SpO2 95%   BMI 43.70 kg/m  Wt Readings from Last 3 Encounters:  03/20/19 (!) 322 lb 3.2 oz (146.1 kg)     Health Maintenance Due  Topic Date Due  . HEMOGLOBIN A1C  March 06, 1960  . Hepatitis C Screening  1960-09-17  .  PNEUMOCOCCAL POLYSACCHARIDE VACCINE AGE 63-64 HIGH RISK  08/27/1961  . FOOT EXAM  08/27/1969  . OPHTHALMOLOGY EXAM  08/27/1969  . URINE MICROALBUMIN  08/27/1969  . HIV Screening  08/28/1974  . TETANUS/TDAP  08/28/1978  . COLONOSCOPY  08/27/2009  . INFLUENZA VACCINE  09/03/2018    There are no preventive care reminders to display for this patient.  No results found for: TSH Lab Results  Component Value Date   WBC 9.2 06/27/2009   HGB 13.3 06/27/2009   HCT 39.1 06/27/2009   MCV 94.3 06/27/2009   PLT 202 06/27/2009   Lab Results  Component Value Date   NA 141 06/27/2009   K  06/27/2009    5.0 REPEATED TO VERIFY DELTA CHECK NOTED NO VISIBLE HEMOLYSIS   CO2 31 06/27/2009   GLUCOSE 152 (H) 06/27/2009   BUN 6 06/27/2009   CREATININE 0.75 06/27/2009   BILITOT 0.6 06/25/2009   ALKPHOS 59 06/25/2009   AST 38 (H) 06/25/2009   ALT 45 06/25/2009   PROT 7.0 06/25/2009   ALBUMIN 4.3 06/25/2009   CALCIUM 9.1 06/27/2009   No results found for: CHOL No results found for: HDL No results found for: LDLCALC No results found for: TRIG No results found for: CHOLHDL No results found for: HGBA1C    Assessment & Plan:   Problem List Items Addressed This Visit      Cardiovascular and Mediastinum   Essential hypertension, benign - Primary    An individual care plan was established and reinforced today.  The patient's status was assessed using clinical findings on exam and labs or diagnostic tests.  The patient's success at meeting treatment goals on disease specific evidence-based guidelines and found to be good controlled. SELF MANAGEMENT: The patient and I together assessed ways to personally work towards obtaining the recommended goals. RECOMMENDATIONS: avoid decongestants found in common cold remedies, decrease consumption of alcohol, perform routine monitoring of BP with home BP cuff, exercise, reduction of dietary salt, take medicines as prescribed, try not to miss doses and quit smoking.  Regular exercise and maintaining a healthy weight is needed.  Stress reduction may help. A CLINICAL SUMMARY including written plan identify barriers to care unique to individual due to social or financial issues.  We attempt to mutually creat solutions for individual and family understanding.lisinopril 10 mg added for renal protection.      Relevant Medications   lisinopril (ZESTRIL) 10 MG tablet   Other Relevant Orders   CBC with Differential   Comprehensive metabolic panel     Endocrine   Diabetic polyneuropathy (HCC) (Chronic)    An individual care plan was established and reinforced today.  The patient's status was assessed using clinical findings on exam, labs and diagnostic testing. Patient success at meeting goals based on disease specific evidence-based guidelines and found to be good controlled. Medications were assessed and patient's understanding of the medical issues , including barriers were assessed. Recommend adherence to a diabetic diet, a graduated exercise program, HgbA1c level is checked quarterly, and urine microalbumin performed yearly .  Annual mono-filament sensation testing performed. Lower blood pressure and control hyperlipidemia is important. Get annual eye exams and annual flu shots and smoking cessation discussed.  Self management goals were discussed. Jaurdiance added back      Relevant Medications   lisinopril (ZESTRIL) 10 MG tablet   Other Relevant Orders    Hemoglobin A1c   Diabetes ([place])    An individual care plan was established and reinforced today.  The  patient's status was assessed using clinical findings on exam, labs and diagnostic testing. Patient success at meeting goals based on disease specific evidence-based guidelines and found to be good controlled. Medications were assessed and patient's understanding of the medical issues , including barriers were assessed. Recommend adherence to a diabetic diet, a graduated exercise program, HgbA1c level is checked quarterly, and urine microalbumin performed yearly .  Annual mono-filament sensation testing performed. Lower blood pressure and control hyperlipidemia is important. Get annual eye exams and annual flu shots and smoking cessation discussed.  Self management goals were discussed. Jardience added back      Relevant Medications   lisinopril (ZESTRIL) 10 MG tablet     Other   Adjustment disorder with depressed mood (Chronic)    AN INDIVIDUAL CARE PLAN was established and reinforced today.  The patient's status was assessed using clinical findings on exam, labs, and other diagnostic testing. Patient's success at meeting treatment goals based on disease specific evidence-bassed guidelines and found to be in good control. RECOMMENDATIONS include maintaining present medicines and treatment.      CHI (closed head injury), sequela (Chronic)    AN INDIVIDUAL CARE PLAN was established and reinforced today.  The patient's status was assessed using clinical findings on exam, labs, and other diagnostic testing. Patient's success at meeting treatment goals based on disease specific evidence-bassed guidelines and found to be in good control. RECOMMENDATIONS include maintaining present medicines and treatment. No worsening of mental capacity.      Encounter for long-term use of opiate analgesic (Chronic)    AN INDIVIDUAL CARE PLAN was established and reinforced today.  The patient's status was assessed  using clinical findings on exam, labs, and other diagnostic testing. Patient's success at meeting treatment goals based on disease specific evidence-bassed guidelines and found to be in good control. RECOMMENDATIONS include maintaining present medicines and treatment.      Morbid obesity ([place]) (Chronic)    AN INDIVIDUAL CARE PLAN was established and reinforced today.  The patient's status was assessed using clinical findings on exam, labs, and other diagnostic testing. Patient's success at meeting treatment goals based on disease specific evidence-bassed guidelines and found to be in poor control. RECOMMENDATIONS include maintaining present medicines and treatment. We discussed cutting calories by cutting down portion size and increase physical exercise.      Mixed hyperlipidemia    AN INDIVIDUAL CARE PLAN was established and reinforced today.  The patient's status was assessed using clinical findings on exam, lab and other diagnostic tests. The patient's disease status was assessed based on evidence-based guidelines and found to be fair controlled. MEDICATIONS were reviewed. SELF MANAGEMENT GOALS have been discussed and patient's success at attaining the goal of low cholesterol was assessed. RECOMMENDATION given include regular exercise 3 days a week and low cholesterol/low fat diet. CLINICAL SUMMARY including written plan to identify barriers unique to the patient due to social or economic  reasons was discussed.      Relevant Medications   lisinopril (ZESTRIL) 10 MG tablet   Other Relevant Orders   Lipid Panel      Meds ordered this encounter  Medications  . lisinopril (ZESTRIL) 10 MG tablet    Sig: Take 1 tablet (10 mg total) by mouth daily.    Dispense:  90 tablet    Refill:  2    Follow-up: Return in about 4 months (around 07/18/2019).    Reinaldo Meeker, MD

## 2019-03-20 NOTE — Assessment & Plan Note (Signed)
AN INDIVIDUAL CARE PLAN was established and reinforced today.  The patient's status was assessed using clinical findings on exam, labs, and other diagnostic testing. Patient's success at meeting treatment goals based on disease specific evidence-bassed guidelines and found to be in poor control. RECOMMENDATIONS include maintaining present medicines and treatment. We discussed cutting calories by cutting down portion size and increase physical exercise.

## 2019-03-20 NOTE — Assessment & Plan Note (Signed)
AN INDIVIDUAL CARE PLAN was established and reinforced today.  The patient's status was assessed using clinical findings on exam, lab and other diagnostic tests. The patient's disease status was assessed based on evidence-based guidelines and found to be fair controlled. MEDICATIONS were reviewed. SELF MANAGEMENT GOALS have been discussed and patient's success at attaining the goal of low cholesterol was assessed. RECOMMENDATION given include regular exercise 3 days a week and low cholesterol/low fat diet. CLINICAL SUMMARY including written plan to identify barriers unique to the patient due to social or economic  reasons was discussed. 

## 2019-03-20 NOTE — Assessment & Plan Note (Signed)
An individual care plan was established and reinforced today.  The patient's status was assessed using clinical findings on exam and labs or diagnostic tests. The patient's success at meeting treatment goals on disease specific evidence-based guidelines and found to be good controlled. SELF MANAGEMENT: The patient and I together assessed ways to personally work towards obtaining the recommended goals. RECOMMENDATIONS: avoid decongestants found in common cold remedies, decrease consumption of alcohol, perform routine monitoring of BP with home BP cuff, exercise, reduction of dietary salt, take medicines as prescribed, try not to miss doses and quit smoking.  Regular exercise and maintaining a healthy weight is needed.  Stress reduction may help. A CLINICAL SUMMARY including written plan identify barriers to care unique to individual due to social or financial issues.  We attempt to mutually creat solutions for individual and family understanding.lisinopril 10 mg added for renal protection.

## 2019-03-20 NOTE — Assessment & Plan Note (Signed)
AN INDIVIDUAL CARE PLAN was established and reinforced today.  The patient's status was assessed using clinical findings on exam, labs, and other diagnostic testing. Patient's success at meeting treatment goals based on disease specific evidence-bassed guidelines and found to be in good control. RECOMMENDATIONS include maintaining present medicines and treatment. No worsening of mental capacity.

## 2019-03-20 NOTE — Assessment & Plan Note (Signed)
An individual care plan was established and reinforced today.  The patient's status was assessed using clinical findings on exam, labs and diagnostic testing. Patient success at meeting goals based on disease specific evidence-based guidelines and found to be good controlled. Medications were assessed and patient's understanding of the medical issues , including barriers were assessed. Recommend adherence to a diabetic diet, a graduated exercise program, HgbA1c level is checked quarterly, and urine microalbumin performed yearly .  Annual mono-filament sensation testing performed. Lower blood pressure and control hyperlipidemia is important. Get annual eye exams and annual flu shots and smoking cessation discussed.  Self management goals were discussed. Jardience added back

## 2019-03-21 LAB — CBC WITH DIFFERENTIAL/PLATELET
Basophils Absolute: 0.1 10*3/uL (ref 0.0–0.2)
Basos: 2 %
EOS (ABSOLUTE): 0.1 10*3/uL (ref 0.0–0.4)
Eos: 4 %
Hematocrit: 46.2 % (ref 37.5–51.0)
Hemoglobin: 16 g/dL (ref 13.0–17.7)
Immature Grans (Abs): 0 10*3/uL (ref 0.0–0.1)
Immature Granulocytes: 0 %
Lymphocytes Absolute: 1.5 10*3/uL (ref 0.7–3.1)
Lymphs: 51 %
MCH: 31.3 pg (ref 26.6–33.0)
MCHC: 34.6 g/dL (ref 31.5–35.7)
MCV: 90 fL (ref 79–97)
Monocytes Absolute: 0.3 10*3/uL (ref 0.1–0.9)
Monocytes: 10 %
Neutrophils Absolute: 1 10*3/uL — ABNORMAL LOW (ref 1.4–7.0)
Neutrophils: 33 %
Platelets: 165 10*3/uL (ref 150–450)
RBC: 5.12 x10E6/uL (ref 4.14–5.80)
RDW: 13 % (ref 11.6–15.4)
WBC: 3 10*3/uL — ABNORMAL LOW (ref 3.4–10.8)

## 2019-03-21 LAB — HEMOGLOBIN A1C
Est. average glucose Bld gHb Est-mCnc: 174 mg/dL
Hgb A1c MFr Bld: 7.7 % — ABNORMAL HIGH (ref 4.8–5.6)

## 2019-03-21 LAB — COMPREHENSIVE METABOLIC PANEL
ALT: 37 IU/L (ref 0–44)
AST: 32 IU/L (ref 0–40)
Albumin/Globulin Ratio: 1.7 (ref 1.2–2.2)
Albumin: 4.4 g/dL (ref 3.8–4.9)
Alkaline Phosphatase: 84 IU/L (ref 39–117)
BUN/Creatinine Ratio: 11 (ref 9–20)
BUN: 9 mg/dL (ref 6–24)
Bilirubin Total: 0.5 mg/dL (ref 0.0–1.2)
CO2: 24 mmol/L (ref 20–29)
Calcium: 9.3 mg/dL (ref 8.7–10.2)
Chloride: 101 mmol/L (ref 96–106)
Creatinine, Ser: 0.85 mg/dL (ref 0.76–1.27)
GFR calc Af Amer: 110 mL/min/{1.73_m2} (ref >59)
GFR calc non Af Amer: 95 mL/min/{1.73_m2} (ref >59)
Globulin, Total: 2.6 g/dL (ref 1.5–4.5)
Glucose: 148 mg/dL — ABNORMAL HIGH (ref 65–99)
Potassium: 4.4 mmol/L (ref 3.5–5.2)
Sodium: 139 mmol/L (ref 134–144)
Total Protein: 7 g/dL (ref 6.0–8.5)

## 2019-03-21 LAB — LIPID PANEL
Chol/HDL Ratio: 3.1 ratio (ref 0.0–5.0)
Cholesterol, Total: 165 mg/dL (ref 100–199)
HDL: 54 mg/dL (ref >39)
LDL Chol Calc (NIH): 86 mg/dL (ref 0–99)
Triglycerides: 145 mg/dL (ref 0–149)
VLDL Cholesterol Cal: 25 mg/dL (ref 5–40)

## 2019-03-21 LAB — CARDIOVASCULAR RISK ASSESSMENT

## 2019-03-21 NOTE — Progress Notes (Signed)
No anemia, glucose 148, kidney tests normal, one liver tests slightly high we will watch, cholesterol normal, a1c 7.7 we restarted jardience yesterday lp

## 2019-03-27 ENCOUNTER — Other Ambulatory Visit: Payer: Self-pay

## 2019-03-27 ENCOUNTER — Other Ambulatory Visit: Payer: Self-pay | Admitting: Legal Medicine

## 2019-03-27 MED ORDER — ALPRAZOLAM 0.5 MG PO TABS: 0.5000 mg | ORAL_TABLET | Freq: Every day | ORAL | 2 refills | Status: DC | PRN

## 2019-04-01 ENCOUNTER — Other Ambulatory Visit: Payer: Self-pay | Admitting: Legal Medicine

## 2019-04-17 ENCOUNTER — Other Ambulatory Visit: Payer: Self-pay

## 2019-04-17 MED ORDER — JARDIANCE 10 MG PO TABS: 10.0000 mg | ORAL_TABLET | Freq: Every day | ORAL | 1 refills | Status: DC

## 2019-05-01 ENCOUNTER — Other Ambulatory Visit: Payer: Self-pay | Admitting: Legal Medicine

## 2019-05-12 DIAGNOSIS — G894 Chronic pain syndrome: Secondary | ICD-10-CM | POA: Diagnosis not present

## 2019-06-30 ENCOUNTER — Other Ambulatory Visit: Payer: Self-pay | Admitting: Legal Medicine

## 2019-07-11 ENCOUNTER — Other Ambulatory Visit: Payer: Self-pay

## 2019-07-11 MED ORDER — ALPRAZOLAM 0.5 MG PO TABS: 0.5000 mg | ORAL_TABLET | Freq: Every day | ORAL | 2 refills | Status: DC | PRN

## 2019-07-18 ENCOUNTER — Ambulatory Visit (INDEPENDENT_AMBULATORY_CARE_PROVIDER_SITE_OTHER): Payer: PPO | Admitting: Legal Medicine

## 2019-07-18 ENCOUNTER — Encounter: Payer: Self-pay | Admitting: Legal Medicine

## 2019-07-18 ENCOUNTER — Other Ambulatory Visit: Payer: Self-pay

## 2019-07-18 VITALS — BP 118/72 | HR 52 | Temp 98.0°F | Ht 72.0 in | Wt 285.0 lb

## 2019-07-18 DIAGNOSIS — K581 Irritable bowel syndrome with constipation: Secondary | ICD-10-CM

## 2019-07-18 DIAGNOSIS — I10 Essential (primary) hypertension: Secondary | ICD-10-CM | POA: Diagnosis not present

## 2019-07-18 DIAGNOSIS — F4321 Adjustment disorder with depressed mood: Secondary | ICD-10-CM | POA: Diagnosis not present

## 2019-07-18 DIAGNOSIS — Z6838 Body mass index (BMI) 38.0-38.9, adult: Secondary | ICD-10-CM | POA: Diagnosis not present

## 2019-07-18 DIAGNOSIS — R011 Cardiac murmur, unspecified: Secondary | ICD-10-CM

## 2019-07-18 DIAGNOSIS — E1142 Type 2 diabetes mellitus with diabetic polyneuropathy: Secondary | ICD-10-CM | POA: Diagnosis not present

## 2019-07-18 DIAGNOSIS — G629 Polyneuropathy, unspecified: Secondary | ICD-10-CM

## 2019-07-18 DIAGNOSIS — E782 Mixed hyperlipidemia: Secondary | ICD-10-CM

## 2019-07-18 NOTE — Progress Notes (Signed)
Established Patient Office Visit  Subjective:  Patient ID: Brad Johnson, male    DOB: 1960-09-19  Age: 59 y.o. MRN: [id number]  CC:  Chief Complaint  Patient presents with  . Diabetes  . Hypertension  . Hyperlipidemia    HPI Brad Johnson presents for Chronic visit.  Patient present with type 2 diabetes.  Specifically, this is type 2, noninsulin requiring diabetes, complicated by polyneuropathy.  Compliance with treatment has been good; patient take medicines as directed, maintains diet and exercise regimen, follows up as directed, and is keeping glucose diary.  Date of  diagnosis 2010.  Depression screen has been performed.Tobacco screen Current medicines for diabetes jardiance.  Patient is on lisinopril for renal protection and crestor for cholesterol control.  Patient performs foot exams daily and last ophthalmologic exam was soon.  Patient presents for follow up of hypertension.  Patient tolerating lisinoprl well with side effects.  Patient was diagnosed with hypertension 2010 so has been treated for hypertension for 10 years.Patient is working on maintaining diet and exercise regimen and follows up as directed. Complication include none.  Patient presents with hyperlipidemia.  Compliance with treatment has been good; patient takes medicines as directed, maintains low cholesterol diet, follows up as directed, and maintains exercise regimen.  Patient is using crestor without problems.  Past Medical History:  Diagnosis Date  . Arthritis   . Depression   . Hyperlipidemia   . Prostate enlargement     Past Surgical History:  Procedure Laterality Date  . CYSTOSCOPY WITH INSERTION OF UROLIFT    . FRACTURE SURGERY    . SPINE SURGERY    . TOTAL HIP ARTHROPLASTY      History reviewed. No pertinent family history.  Social History   Socioeconomic History  . Marital status: Widowed    Spouse name: Not on file  . Number of children: Not on file  . Years of education: Not on  file  . Highest education level: Not on file  Occupational History  . Not on file  Tobacco Use  . Smoking status: Never Smoker  . Smokeless tobacco: Never Used  Vaping Use  . Vaping Use: Never used  Substance and Sexual Activity  . Alcohol use: Never  . Drug use: Never  . Sexual activity: Not on file  Other Topics Concern  . Not on file  Social History Narrative  . Not on file   Social Determinants of Health   Financial Resource Strain:   . Difficulty of Paying Living Expenses:   Food Insecurity:   . Worried About Charity fundraiser in the Last Year:   . Arboriculturist in the Last Year:   Transportation Needs:   . Film/video editor (Medical):   Marland Kitchen Lack of Transportation (Non-Medical):   Physical Activity:   . Days of Exercise per Week:   . Minutes of Exercise per Session:   Stress:   . Feeling of Stress :   Social Connections:   . Frequency of Communication with Friends and Family:   . Frequency of Social Gatherings with Friends and Family:   . Attends Religious Services:   . Active Member of Clubs or Organizations:   . Attends Archivist Meetings:   Marland Kitchen Marital Status:   Intimate Partner Violence:   . Fear of Current or Ex-Partner:   . Emotionally Abused:   Marland Kitchen Physically Abused:   . Sexually Abused:     Outpatient Medications Prior to Visit  Medication Sig Dispense Refill  . ALPRAZolam (XANAX) 0.5 MG tablet Take 1 tablet (0.5 mg total) by mouth daily as needed. 30 tablet 2  . aspirin EC 81 MG tablet Take 81 mg by mouth daily.    . bethanechol (URECHOLINE) 25 MG tablet Take 25 mg by mouth daily.    . celecoxib (CELEBREX) 200 MG capsule daily.    Marland Kitchen docusate sodium (COLACE) 100 MG capsule Take 100 mg by mouth 2 (two) times daily.    . furosemide (LASIX) 20 MG tablet TAKE 1 TABLET(20 MG) BY MOUTH 1 TIME PER DAY 30 tablet 6  . gabapentin (NEURONTIN) 600 MG tablet 3 (three) times daily.    Marland Kitchen ibuprofen (ADVIL) 600 MG tablet 3 (three) times daily.    Marland Kitchen  JARDIANCE 10 MG TABS tablet Take 10 mg by mouth daily. 90 tablet 1  . LINZESS 145 MCG CAPS capsule TAKE 1 CAPSULE BY MOUTH DAILY 30 MINUTES BEFORE THE FIRST MEAL OF THE DAY 90 capsule 1  . lisinopril (ZESTRIL) 10 MG tablet Take 1 tablet (10 mg total) by mouth daily. 90 tablet 2  . loratadine (CLARITIN) 10 MG tablet Take 10 mg by mouth daily as needed for allergies.    . Omega-3 Fatty Acids (FISH OIL) 1000 MG CAPS Take 1 capsule by mouth daily.    Marland Kitchen omeprazole (PRILOSEC) 20 MG capsule TAKE ONE CAPSULE BY MOUTH DAILY 90 capsule 2  . oxyCODONE (ROXICODONE) 15 MG immediate release tablet Take 15 mg by mouth every 4 (four) hours as needed.    . rosuvastatin (CRESTOR) 10 MG tablet TAKE 1 TABLET(10 MG) BY MOUTH EVERY DAY 90 tablet 2  . tamsulosin (FLOMAX) 0.4 MG CAPS capsule Take 0.4 mg by mouth daily.     No facility-administered medications prior to visit.    No Known Allergies  ROS Review of Systems  Constitutional: Negative.   HENT: Negative.   Eyes: Negative.   Respiratory: Negative.   Cardiovascular: Negative.   Gastrointestinal: Negative.   Endocrine: Negative.   Genitourinary: Negative.   Musculoskeletal: Negative.   Neurological: Negative.   Hematological: Negative.   Psychiatric/Behavioral: Negative.       Objective:    Physical Exam Vitals reviewed.  Constitutional:      Appearance: Normal appearance.  HENT:     Head: Atraumatic.     Nose: Nose normal.     Mouth/Throat:     Mouth: Mucous membranes are dry.  Cardiovascular:     Rate and Rhythm: Normal rate and regular rhythm.     Pulses: Normal pulses.     Heart sounds: Murmur heard.  Systolic murmur is present with a grade of 2/6.   Pulmonary:     Breath sounds: Normal breath sounds.  Musculoskeletal:     Cervical back: Normal range of motion and neck supple.  Skin:    General: Skin is warm.     Capillary Refill: Capillary refill takes less than 2 seconds.  Neurological:     Mental Status: He is alert and  oriented to person, place, and time. Mental status is at baseline.     BP 118/72   Pulse (!) 52   Temp 98 F (36.7 C)   Ht 6' (1.829 m)   Wt 285 lb (129.3 kg)   SpO2 98%   BMI 38.65 kg/m  Wt Readings from Last 3 Encounters:  07/18/19 285 lb (129.3 kg)  03/20/19 (!) 322 lb 3.2 oz (146.1 kg)     Health Maintenance Due  Topic Date Due  . Hepatitis C Screening  Never done  . PNEUMOCOCCAL POLYSACCHARIDE VACCINE AGE 41-64 HIGH RISK  Never done  . FOOT EXAM  Never done  . OPHTHALMOLOGY EXAM  Never done  . COVID-19 Vaccine (1) Never done  . HIV Screening  Never done  . TETANUS/TDAP  Never done  . COLONOSCOPY  Never done    There are no preventive care reminders to display for this patient.  No results found for: TSH Lab Results  Component Value Date   WBC 3.0 (L) 03/20/2019   HGB 16.0 03/20/2019   HCT 46.2 03/20/2019   MCV 90 03/20/2019   PLT 165 03/20/2019   Lab Results  Component Value Date   NA 139 03/20/2019   K 4.4 03/20/2019   CO2 24 03/20/2019   GLUCOSE 148 (H) 03/20/2019   BUN 9 03/20/2019   CREATININE 0.85 03/20/2019   BILITOT 0.5 03/20/2019   ALKPHOS 84 03/20/2019   AST 32 03/20/2019   ALT 37 03/20/2019   PROT 7.0 03/20/2019   ALBUMIN 4.4 03/20/2019   CALCIUM 9.3 03/20/2019   Lab Results  Component Value Date   CHOL 165 03/20/2019   Lab Results  Component Value Date   HDL 54 03/20/2019   Lab Results  Component Value Date   LDLCALC 86 03/20/2019   Lab Results  Component Value Date   TRIG 145 03/20/2019   Lab Results  Component Value Date   CHOLHDL 3.1 03/20/2019   Lab Results  Component Value Date   HGBA1C 7.7 (H) 03/20/2019      Assessment & Plan:  Hypertension: An individual hypertension care plan was established and reinforced today.  The patient's status was assessed using clinical findings on exam and labs or diagnostic tests. The patient's success at meeting treatment goals on disease specific evidence-based guidelines and  found to be well controlled. SELF MANAGEMENT: The patient and I together assessed ways to personally work towards obtaining the recommended goals. RECOMMENDATIONS: avoid decongestants found in common cold remedies, decrease consumption of alcohol, perform routine monitoring of BP with home BP cuff, exercise, reduction of dietary salt, take medicines as prescribed, try not to miss doses and quit smoking.  Regular exercise and maintaining a healthy weight is needed.  Stress reduction may help. A CLINICAL SUMMARY including written plan identify barriers to care unique to individual due to social or financial issues.  We attempt to mutually creat solutions for individual and family understanding.  IBS: The symptoms are well controlled.  Diabetes with polyneuropathy: An individual care plan for diabetes was established and reinforced today.  The patient's status was assessed using clinical findings on exam, labs and diagnostic testing. Patient success at meeting goals based on disease specific evidence-based guidelines and found to be good controlled. Medications were assessed and patient's understanding of the medical issues , including barriers were assessed. Recommend adherence to a diabetic diet, a graduated exercise program, HgbA1c level is checked quarterly, and urine microalbumin performed yearly .  Annual mono-filament sensation testing performed. Lower blood pressure and control hyperlipidemia is important. Get annual eye exams and annual flu shots and smoking cessation discussed.  Self management goals were discussed.  Mixed hyperlipidemia: AN INDIVIDUAL CARE PLAN for hyperlipidemia/ cholesterol was established and reinforced today.  The patient's status was assessed using clinical findings on exam, lab and other diagnostic tests. The patient's disease status was assessed based on evidence-based guidelines and found to be well controlled. MEDICATIONS were reviewed. SELF MANAGEMENT GOALS have  been discussed and patient's success at attaining the goal of low cholesterol was assessed. RECOMMENDATION given include regular exercise 3 days a week and low cholesterol/low fat diet. CLINICAL SUMMARY including written plan to identify barriers unique to the patient due to social or economic  reasons was discussed.  Closed head injury: He is stable and has new interaction with people.  No memory changes.  Chronic pain: AN INDIVIDUAL CARE PLAN was established and reinforced today.  The patient's status was assessed using clinical findings on exam, labs, and other diagnostic testing. Patient's success at meeting treatment goals based on disease specific evidence-bassed guidelines and found to be in fair control. RECOMMENDATIONS include maintining present medicines and treatment. He is on chronicoxycosone with no abuse.  Negative REMS. He goes to pain clinic.  BMI 38: An individualize plan was formulated for obesity using patient history and physical exam to encourage weight loss.  An evidence based program was formulated.  Patient is to cut portion size with meals and to plan physical exercise 3 days a week at least 20 minutes.  Weight watchers and other programs are helpful.  Planned amount of weight loss 10 lbs. With diabetes and hypertension, he meets criteria for morbid obesity.  Morbid obesity: An individualize plan was formulated for obesity using patient history and physical exam to encourage weight loss.  An evidence based program was formulated.  Patient is to cut portion sze with meals and to plan physical exercise 3 days a week at least 20 minutes.  Weight watchers and other programs are helpful.  Planned amount of weight loss 10 lbs.      Follow-up: Return in about 4 months (around 11/17/2019) for fasting.    Reinaldo Meeker, MD

## 2019-07-21 LAB — CBC WITH DIFFERENTIAL/PLATELET
Basophils Absolute: 0 10*3/uL (ref 0.0–0.2)
Basos: 2 %
EOS (ABSOLUTE): 0 10*3/uL (ref 0.0–0.4)
Eos: 2 %
Hematocrit: 47 % (ref 37.5–51.0)
Hemoglobin: 15.7 g/dL (ref 13.0–17.7)
Immature Grans (Abs): 0 10*3/uL (ref 0.0–0.1)
Immature Granulocytes: 0 %
Lymphocytes Absolute: 1.4 10*3/uL (ref 0.7–3.1)
Lymphs: 53 %
MCH: 31.7 pg (ref 26.6–33.0)
MCHC: 33.4 g/dL (ref 31.5–35.7)
MCV: 95 fL (ref 79–97)
Monocytes Absolute: 0.2 10*3/uL (ref 0.1–0.9)
Monocytes: 8 %
Neutrophils Absolute: 0.9 10*3/uL — ABNORMAL LOW (ref 1.4–7.0)
Neutrophils: 35 %
Platelets: 166 10*3/uL (ref 150–450)
RBC: 4.95 x10E6/uL (ref 4.14–5.80)
RDW: 12.8 % (ref 11.6–15.4)
WBC: 2.6 10*3/uL — ABNORMAL LOW (ref 3.4–10.8)

## 2019-07-21 LAB — COMPREHENSIVE METABOLIC PANEL
ALT: 25 IU/L (ref 0–44)
AST: 25 IU/L (ref 0–40)
Albumin/Globulin Ratio: 2 (ref 1.2–2.2)
Albumin: 4.7 g/dL (ref 3.8–4.9)
Alkaline Phosphatase: 56 IU/L (ref 48–121)
BUN/Creatinine Ratio: 14 (ref 9–20)
BUN: 11 mg/dL (ref 6–24)
Bilirubin Total: 0.6 mg/dL (ref 0.0–1.2)
CO2: 24 mmol/L (ref 20–29)
Calcium: 9.1 mg/dL (ref 8.7–10.2)
Chloride: 99 mmol/L (ref 96–106)
Creatinine, Ser: 0.77 mg/dL (ref 0.76–1.27)
GFR calc Af Amer: 115 mL/min/{1.73_m2} (ref >59)
GFR calc non Af Amer: 99 mL/min/{1.73_m2} (ref >59)
Globulin, Total: 2.4 g/dL (ref 1.5–4.5)
Glucose: 104 mg/dL — ABNORMAL HIGH (ref 65–99)
Potassium: 4.3 mmol/L (ref 3.5–5.2)
Sodium: 138 mmol/L (ref 134–144)
Total Protein: 7.1 g/dL (ref 6.0–8.5)

## 2019-07-21 LAB — LIPID PANEL
Chol/HDL Ratio: 2.7 ratio (ref 0.0–5.0)
Cholesterol, Total: 159 mg/dL (ref 100–199)
HDL: 60 mg/dL (ref >39)
LDL Chol Calc (NIH): 86 mg/dL (ref 0–99)
Triglycerides: 63 mg/dL (ref 0–149)
VLDL Cholesterol Cal: 13 mg/dL (ref 5–40)

## 2019-07-21 LAB — HEMOGLOBIN A1C
Est. average glucose Bld gHb Est-mCnc: 131 mg/dL
Hgb A1c MFr Bld: 6.2 % — ABNORMAL HIGH (ref 4.8–5.6)

## 2019-07-21 LAB — CARDIOVASCULAR RISK ASSESSMENT

## 2019-07-21 NOTE — Progress Notes (Signed)
WBC low 2.6, no anemia, glucose 104, kidney tests normal, liver tests normal, cholesterol normal, A1c 6.2 lp

## 2019-08-16 DIAGNOSIS — G894 Chronic pain syndrome: Secondary | ICD-10-CM | POA: Diagnosis not present

## 2019-08-30 ENCOUNTER — Other Ambulatory Visit: Payer: Self-pay

## 2019-08-30 ENCOUNTER — Other Ambulatory Visit: Payer: Self-pay | Admitting: Legal Medicine

## 2019-08-30 MED ORDER — NALOXONE HCL 0.4 MG/ML IJ SOLN: 0.4000 mg | INTRAMUSCULAR | 3 refills | Status: DC | PRN

## 2019-09-28 ENCOUNTER — Other Ambulatory Visit: Payer: Self-pay | Admitting: Legal Medicine

## 2019-09-28 DIAGNOSIS — G629 Polyneuropathy, unspecified: Secondary | ICD-10-CM

## 2019-09-28 DIAGNOSIS — K5909 Other constipation: Secondary | ICD-10-CM

## 2019-10-14 ENCOUNTER — Other Ambulatory Visit: Payer: Self-pay | Admitting: Legal Medicine

## 2019-10-15 ENCOUNTER — Other Ambulatory Visit: Payer: Self-pay | Admitting: Legal Medicine

## 2019-11-02 ENCOUNTER — Other Ambulatory Visit: Payer: Self-pay | Admitting: Family Medicine

## 2019-11-17 ENCOUNTER — Encounter: Payer: Self-pay | Admitting: Legal Medicine

## 2019-11-17 ENCOUNTER — Other Ambulatory Visit: Payer: Self-pay

## 2019-11-17 ENCOUNTER — Ambulatory Visit (INDEPENDENT_AMBULATORY_CARE_PROVIDER_SITE_OTHER): Payer: PPO | Admitting: Legal Medicine

## 2019-11-17 VITALS — BP 114/72 | HR 50 | Temp 97.5°F | Resp 16 | Ht 72.0 in | Wt 281.0 lb

## 2019-11-17 DIAGNOSIS — Z23 Encounter for immunization: Secondary | ICD-10-CM

## 2019-11-17 DIAGNOSIS — N138 Other obstructive and reflux uropathy: Secondary | ICD-10-CM | POA: Diagnosis not present

## 2019-11-17 DIAGNOSIS — E782 Mixed hyperlipidemia: Secondary | ICD-10-CM | POA: Diagnosis not present

## 2019-11-17 DIAGNOSIS — E1142 Type 2 diabetes mellitus with diabetic polyneuropathy: Secondary | ICD-10-CM

## 2019-11-17 DIAGNOSIS — I10 Essential (primary) hypertension: Secondary | ICD-10-CM

## 2019-11-17 DIAGNOSIS — K2100 Gastro-esophageal reflux disease with esophagitis, without bleeding: Secondary | ICD-10-CM | POA: Diagnosis not present

## 2019-11-17 DIAGNOSIS — N401 Enlarged prostate with lower urinary tract symptoms: Secondary | ICD-10-CM | POA: Diagnosis not present

## 2019-11-17 NOTE — Progress Notes (Signed)
Subjective:  Patient ID: Brad Johnson, male    DOB: 1960-10-24  Age: 59 y.o. MRN: [id number]  Chief Complaint  Patient presents with  . Diabetes    4 month follow up    HPI: chronic visit  Patient present with type 2 diabetes.  Specifically, this is type 2, noninsulin requiring diabetes, complicated by polyneuropathy.  Compliance with treatment has been good; patient take medicines as directed, maintains diet and exercise regimen, follows up as directed, and is keeping glucose diary.  Date of  diagnosis 2010.  Depression screen has been performed.Tobacco screen smoer. Current medicines for diabetes jardiance.  Patient is on lisinopril for renal protection and crestor for cholesterol control.  Patient performs foot exams daily and last ophthalmologic exam was no  Patient presents for follow up of hypertension.  Patient tolerating lisinoprl well with side effects.  Patient was diagnosed with hypertension 2010 so has been treated for hypertension for 10 years.Patient is working on maintaining diet and exercise regimen and follows up as directed. Complication include none..    Patient presents with hyperlipidemia.  Compliance with treatment has been good; patient takes medicines as directed, maintains low cholesterol diet, follows up as directed, and maintains exercise regimen.  Patient is using crestor without problems. Current Outpatient Medications on File Prior to Visit  Medication Sig Dispense Refill  . ALPRAZolam (XANAX) 0.5 MG tablet TAKE 1 TABLET(0.5 MG) BY MOUTH DAILY AS NEEDED 30 tablet 0  . aspirin EC 81 MG tablet Take 81 mg by mouth daily.    . bethanechol (URECHOLINE) 25 MG tablet Take 25 mg by mouth daily.    . celecoxib (CELEBREX) 200 MG capsule daily.    Marland Kitchen docusate sodium (COLACE) 100 MG capsule Take 100 mg by mouth 2 (two) times daily.    . furosemide (LASIX) 20 MG tablet TAKE 1 TABLET(20 MG) BY MOUTH 1 TIME PER DAY 30 tablet 6  . gabapentin (NEURONTIN) 600 MG tablet TAKE 1  TABLET BY MOUTH THREE TIMES DAILY 270 tablet 2  . ibuprofen (ADVIL) 600 MG tablet 3 (three) times daily.    Marland Kitchen JARDIANCE 10 MG TABS tablet TAKE 1 TABLET BY MOUTH DAILY 90 tablet 2  . LINZESS 145 MCG CAPS capsule TAKE 1 CAPSULE BY MOUTH DAILY 30 MINUTES BEFORE THE FIRST MEAL OF THE DAY 90 capsule 2  . lisinopril (ZESTRIL) 10 MG tablet Take 1 tablet (10 mg total) by mouth daily. 90 tablet 2  . loratadine (CLARITIN) 10 MG tablet Take 10 mg by mouth daily as needed for allergies.    . naloxone (NARCAN) 0.4 MG/ML injection Inject 1 mL (0.4 mg total) into the vein as needed. 1 mL 3  . Omega-3 Fatty Acids (FISH OIL) 1000 MG CAPS Take 1 capsule by mouth daily.    Marland Kitchen omeprazole (PRILOSEC) 20 MG capsule TAKE ONE CAPSULE BY MOUTH DAILY 90 capsule 2  . oxyCODONE (ROXICODONE) 15 MG immediate release tablet Take 15 mg by mouth every 4 (four) hours as needed.    . rosuvastatin (CRESTOR) 10 MG tablet TAKE 1 TABLET(10 MG) BY MOUTH EVERY DAY 90 tablet 2  . tamsulosin (FLOMAX) 0.4 MG CAPS capsule Take 0.4 mg by mouth daily.     No current facility-administered medications on file prior to visit.   Past Medical History:  Diagnosis Date  . Arthritis   . Depression   . Hyperlipidemia   . Prostate enlargement    Past Surgical History:  Procedure Laterality Date  . CYSTOSCOPY WITH  INSERTION OF UROLIFT    . FRACTURE SURGERY    . SPINE SURGERY    . TOTAL HIP ARTHROPLASTY      History reviewed. No pertinent family history. Social History   Socioeconomic History  . Marital status: Widowed    Spouse name: Not on file  . Number of children: Not on file  . Years of education: Not on file  . Highest education level: Not on file  Occupational History  . Not on file  Tobacco Use  . Smoking status: Never Smoker  . Smokeless tobacco: Never Used  Vaping Use  . Vaping Use: Never used  Substance and Sexual Activity  . Alcohol use: Never  . Drug use: Never  . Sexual activity: Not on file  Other Topics Concern   . Not on file  Social History Narrative  . Not on file   Social Determinants of Health   Financial Resource Strain:   . Difficulty of Paying Living Expenses: Not on file  Food Insecurity:   . Worried About Charity fundraiser in the Last Year: Not on file  . Ran Out of Food in the Last Year: Not on file  Transportation Needs:   . Lack of Transportation (Medical): Not on file  . Lack of Transportation (Non-Medical): Not on file  Physical Activity:   . Days of Exercise per Week: Not on file  . Minutes of Exercise per Session: Not on file  Stress:   . Feeling of Stress : Not on file  Social Connections:   . Frequency of Communication with Friends and Family: Not on file  . Frequency of Social Gatherings with Friends and Family: Not on file  . Attends Religious Services: Not on file  . Active Member of Clubs or Organizations: Not on file  . Attends Archivist Meetings: Not on file  . Marital Status: Not on file    Review of Systems  Constitutional: Negative.   HENT: Negative.   Eyes: Negative.   Respiratory: Negative for cough, shortness of breath and stridor.   Cardiovascular: Negative for chest pain, palpitations and leg swelling.  Gastrointestinal: Negative.   Endocrine: Negative.   Genitourinary: Negative.   Musculoskeletal: Negative.   Skin: Negative.   Neurological: Positive for numbness.  Psychiatric/Behavioral: Negative.      Objective:  BP 114/72   Pulse (!) 50   Temp (!) 97.5 F (36.4 C)   Resp 16   Ht 6' (1.829 m)   Wt 281 lb (127.5 kg)   SpO2 99%   BMI 38.11 kg/m   BP/Weight 11/17/2019 07/18/2019 6/31/4970  Systolic BP 263 785 885  Diastolic BP 72 72 80  Wt. (Lbs) 281 285 322.2  BMI 38.11 38.65 43.7    Physical Exam Vitals reviewed.  Constitutional:      Appearance: Normal appearance.  HENT:     Head: Normocephalic and atraumatic.     Right Ear: Tympanic membrane, ear canal and external ear normal.     Left Ear: Tympanic  membrane, ear canal and external ear normal.     Mouth/Throat:     Mouth: Mucous membranes are moist.     Pharynx: Oropharynx is clear.  Eyes:     Extraocular Movements: Extraocular movements intact.     Conjunctiva/sclera: Conjunctivae normal.     Pupils: Pupils are equal, round, and reactive to light.  Cardiovascular:     Rate and Rhythm: Normal rate and regular rhythm.     Pulses: Normal pulses.  Heart sounds: Normal heart sounds.  Pulmonary:     Effort: Pulmonary effort is normal.     Breath sounds: Normal breath sounds.  Abdominal:     General: Abdomen is flat. Bowel sounds are normal.     Palpations: Abdomen is soft.  Musculoskeletal:     Cervical back: Normal range of motion.  Neurological:     Mental Status: He is alert.     Diabetic Foot Exam - Simple   Simple Foot Form Diabetic Foot exam was performed with the following findings: Yes 11/17/2019  8:09 AM  Visual Inspection No deformities, no ulcerations, no other skin breakdown bilaterally: Yes Sensation Testing See comments: Yes Pulse Check Posterior Tibialis and Dorsalis pulse intact bilaterally: Yes Comments No sensation in feet      Lab Results  Component Value Date   WBC 3.3 (L) 11/17/2019   HGB 14.9 11/17/2019   HCT 44.3 11/17/2019   PLT 168 11/17/2019   GLUCOSE 108 (H) 11/17/2019   CHOL 169 11/17/2019   TRIG 67 11/17/2019   HDL 56 11/17/2019   LDLCALC 100 (H) 11/17/2019   ALT 20 11/17/2019   AST 21 11/17/2019   NA 142 11/17/2019   K 4.5 11/17/2019   CL 100 11/17/2019   CREATININE 0.77 11/17/2019   BUN 11 11/17/2019   CO2 25 11/17/2019   INR 0.96 06/25/2009   HGBA1C 6.1 (H) 11/17/2019      Assessment & Plan:   1. Encounter for immunization - Flu Vaccine MDCK QUAD PF  2. Gastroesophageal reflux disease with esophagitis without hemorrhage Plan of care was formulated today for GERD .  She is doing well.  A plan of care was formulated using patient exam, tests and other sources to  optimize care using evidence based information.  Recommend no smoking, no eating after supper, avoid fatty foods, elevate Head of bed, avoid tight fitting clothing.  Continue on OTC.  3. Essential hypertension, benign An individual hypertension care plan was established and reinforced today.  The patient's status was assessed using clinical findings on exam and labs or diagnostic tests. The patient's success at meeting treatment goals on disease specific evidence-based guidelines and found to be well controlled. SELF MANAGEMENT: The patient and I together assessed ways to personally work towards obtaining the recommended goals. RECOMMENDATIONS: avoid decongestants found in common cold remedies, decrease consumption of alcohol, perform routine monitoring of BP with home BP cuff, exercise, reduction of dietary salt, take medicines as prescribed, try not to miss doses and quit smoking.  Regular exercise and maintaining a healthy weight is needed.  Stress reduction may help. A CLINICAL SUMMARY including written plan identify barriers to care unique to individual due to social or financial issues.  We attempt to mutually creat solutions for individual and family understanding.  4. Diabetic polyneuropathy associated with type 2 diabetes mellitus ([place]) An individual care plan for diabetes was established and reinforced today.  The patient's status was assessed using clinical findings on exam, labs and diagnostic testing. Patient success at meeting goals based on disease specific evidence-based guidelines and found to be good controlled. Medications were assessed and patient's understanding of the medical issues , including barriers were assessed. Recommend adherence to a diabetic diet, a graduated exercise program, HgbA1c level is checked quarterly, and urine microalbumin performed yearly .  Annual mono-filament sensation testing performed. Lower blood pressure and control hyperlipidemia is important. Get annual  eye exams and annual flu shots and smoking cessation discussed.  Self management goals  were discussed.  5. Enlarged prostate with urinary obstruction AN INDIVIDUAL CARE PLAN for BPH was established and reinforced today.  The patient's status was assessed using clinical findings on exam, labs, and other diagnostic testing. Patient's success at meeting treatment goals based on disease specific evidence-bassed guidelines and found to be in goos control. RECOMMENDATIONS include maintaining present medicines and treatment.  6. Mixed hyperlipidemia AN INDIVIDUAL CARE PLAN for hyperlipidemia/ cholesterol was established and reinforced today.  The patient's status was assessed using clinical findings on exam, lab and other diagnostic tests. The patient's disease status was assessed based on evidence-based guidelines and found to be well controlled. MEDICATIONS were reviewed. SELF MANAGEMENT GOALS have been discussed and patient's success at attaining the goal of low cholesterol was assessed. RECOMMENDATION given include regular exercise 3 days a week and low cholesterol/low fat diet. CLINICAL SUMMARY including written plan to identify barriers unique to the patient due to social or economic  reasons was discussed.  7. Morbid obesity ([place]) An individualize plan was formulated for obesity using patient history and physical exam to encourage weight loss.  An evidence based program was formulated.  Patient is to cut portion size with meals and to plan physical exercise 3 days a week at least 20 minutes.  Weight watchers and other programs are helpful.  Consider DASH diet Planned amount of weight loss 10 lbs.      Orders Placed This Encounter  Procedures  . Flu Vaccine MDCK QUAD PF  . CBC with Differential/Platelet  . Comprehensive metabolic panel  . Hemoglobin A1c  . Lipid panel  . Cardiovascular Risk Assessment     Follow-up: Return in about 4 months (around 03/19/2020) for fasting.  An After  Visit Summary was printed and given to the patient.  [place] [phone]

## 2019-11-18 LAB — COMPREHENSIVE METABOLIC PANEL
ALT: 20 IU/L (ref 0–44)
AST: 21 IU/L (ref 0–40)
Albumin/Globulin Ratio: 1.9 (ref 1.2–2.2)
Albumin: 4.6 g/dL (ref 3.8–4.9)
Alkaline Phosphatase: 97 IU/L (ref 44–121)
BUN/Creatinine Ratio: 14 (ref 10–24)
BUN: 11 mg/dL (ref 8–27)
Bilirubin Total: 0.7 mg/dL (ref 0.0–1.2)
CO2: 25 mmol/L (ref 20–29)
Calcium: 9.1 mg/dL (ref 8.6–10.2)
Chloride: 100 mmol/L (ref 96–106)
Creatinine, Ser: 0.77 mg/dL (ref 0.76–1.27)
GFR calc Af Amer: 114 mL/min/{1.73_m2} (ref >59)
GFR calc non Af Amer: 99 mL/min/{1.73_m2} (ref >59)
Globulin, Total: 2.4 g/dL (ref 1.5–4.5)
Glucose: 108 mg/dL — ABNORMAL HIGH (ref 65–99)
Potassium: 4.5 mmol/L (ref 3.5–5.2)
Sodium: 142 mmol/L (ref 134–144)
Total Protein: 7 g/dL (ref 6.0–8.5)

## 2019-11-18 LAB — HEMOGLOBIN A1C
Est. average glucose Bld gHb Est-mCnc: 128 mg/dL
Hgb A1c MFr Bld: 6.1 % — ABNORMAL HIGH (ref 4.8–5.6)

## 2019-11-18 LAB — CBC WITH DIFFERENTIAL/PLATELET
Basophils Absolute: 0.1 10*3/uL (ref 0.0–0.2)
Basos: 2 %
EOS (ABSOLUTE): 0.1 10*3/uL (ref 0.0–0.4)
Eos: 4 %
Hematocrit: 44.3 % (ref 37.5–51.0)
Hemoglobin: 14.9 g/dL (ref 13.0–17.7)
Immature Grans (Abs): 0 10*3/uL (ref 0.0–0.1)
Immature Granulocytes: 0 %
Lymphocytes Absolute: 1.6 10*3/uL (ref 0.7–3.1)
Lymphs: 49 %
MCH: 32.1 pg (ref 26.6–33.0)
MCHC: 33.6 g/dL (ref 31.5–35.7)
MCV: 96 fL (ref 79–97)
Monocytes Absolute: 0.3 10*3/uL (ref 0.1–0.9)
Monocytes: 10 %
Neutrophils Absolute: 1.2 10*3/uL — ABNORMAL LOW (ref 1.4–7.0)
Neutrophils: 35 %
Platelets: 168 10*3/uL (ref 150–450)
RBC: 4.64 x10E6/uL (ref 4.14–5.80)
RDW: 12.6 % (ref 11.6–15.4)
WBC: 3.3 10*3/uL — ABNORMAL LOW (ref 3.4–10.8)

## 2019-11-18 LAB — LIPID PANEL
Chol/HDL Ratio: 3 ratio (ref 0.0–5.0)
Cholesterol, Total: 169 mg/dL (ref 100–199)
HDL: 56 mg/dL (ref >39)
LDL Chol Calc (NIH): 100 mg/dL — ABNORMAL HIGH (ref 0–99)
Triglycerides: 67 mg/dL (ref 0–149)
VLDL Cholesterol Cal: 13 mg/dL (ref 5–40)

## 2019-11-18 LAB — CARDIOVASCULAR RISK ASSESSMENT

## 2019-11-18 NOTE — Progress Notes (Signed)
WBC still 3,300, no anemia, Glucose 108, kidney tests normal, liver tests normal, A1c 6.1, LDL cholesterol 100 lp

## 2019-11-27 ENCOUNTER — Other Ambulatory Visit: Payer: Self-pay | Admitting: Legal Medicine

## 2019-11-27 DIAGNOSIS — I10 Essential (primary) hypertension: Secondary | ICD-10-CM

## 2019-12-13 DIAGNOSIS — Z79899 Other long term (current) drug therapy: Secondary | ICD-10-CM | POA: Diagnosis not present

## 2019-12-20 ENCOUNTER — Other Ambulatory Visit: Payer: Self-pay | Admitting: Family Medicine

## 2019-12-21 NOTE — Telephone Encounter (Signed)
Your pt. Kc  

## 2020-01-26 ENCOUNTER — Other Ambulatory Visit: Payer: Self-pay | Admitting: Legal Medicine

## 2020-02-25 ENCOUNTER — Other Ambulatory Visit: Payer: Self-pay | Admitting: Legal Medicine

## 2020-03-12 ENCOUNTER — Telehealth: Payer: Self-pay | Admitting: Legal Medicine

## 2020-03-12 NOTE — Chronic Care Management (AMB) (Signed)
  Chronic Care Management   Note  03/12/2020 Name: [name] MRN: [id number] DOB: 03/21/1960  [name] is a 60 y.o. year old male who is a primary care patient of Lillard Anes, MD. I reached out to [name] by phone today in response to a referral sent by Mr. Karan Ramnauth Orama's PCP, Lillard Anes, MD.   Mr. Graham was given information about Chronic Care Management services today including:  1. CCM service includes personalized support from designated clinical staff supervised by his physician, including individualized plan of care and coordination with other care providers 2. 24/7 contact phone numbers for assistance for urgent and routine care needs. 3. Service will only be billed when office clinical staff spend 20 minutes or more in a month to coordinate care. 4. Only one practitioner may furnish and bill the service in a calendar month. 5. The patient may stop CCM services at any time (effective at the end of the month) by phone call to the office staff.   Patient agreed to services and verbal consent obtained.   Follow up plan:   [place]

## 2020-03-21 ENCOUNTER — Encounter: Payer: Self-pay | Admitting: Legal Medicine

## 2020-03-21 ENCOUNTER — Other Ambulatory Visit: Payer: Self-pay

## 2020-03-21 ENCOUNTER — Ambulatory Visit (INDEPENDENT_AMBULATORY_CARE_PROVIDER_SITE_OTHER): Payer: PPO | Admitting: Legal Medicine

## 2020-03-21 VITALS — BP 130/70 | HR 57 | Temp 97.6°F | Ht 72.0 in | Wt 296.0 lb

## 2020-03-21 DIAGNOSIS — E782 Mixed hyperlipidemia: Secondary | ICD-10-CM | POA: Diagnosis not present

## 2020-03-21 DIAGNOSIS — I10 Essential (primary) hypertension: Secondary | ICD-10-CM | POA: Diagnosis not present

## 2020-03-21 DIAGNOSIS — E1142 Type 2 diabetes mellitus with diabetic polyneuropathy: Secondary | ICD-10-CM

## 2020-03-21 DIAGNOSIS — N400 Enlarged prostate without lower urinary tract symptoms: Secondary | ICD-10-CM

## 2020-03-21 NOTE — Progress Notes (Signed)
Subjective:  Patient ID: Brad Johnson, male    DOB: [birth year]  Age: 60 y.o. MRN: [id number]  Chief Complaint  Patient presents with  . Diabetes  . Hyperlipidemia  . Hypertension    HPI: chronic visit  Patient present with type 2 diabetes.  Specifically, this is type 2, noninsulin requiring diabetes, complicated by polyneuropathy.  Compliance with treatment has been good; patient take medicines as directed, maintains diet and exercise regimen, follows up as directed, and is keeping glucose diary.  Date of  diagnosis 2010.  Depression screen has been performed.Tobacco screen nnsmoker. Current medicines for diabetes jardiance.  Patient is on lisinopril for renal protection and lisinoprl for cholesterol control.  Patient performs foot exams daily and last ophthalmologic exam was no.  Patient presents for follow up of hypertension.  Patient tolerating lisinopril well with side effects.  Patient was diagnosed with hypertension 2010 so has been treated for hypertension for 10 years.Patient is working on maintaining diet and exercise regimen and follows up as directed. Complication include none.  Patient presents with hyperlipidemia.  Compliance with treatment has been good; patient takes medicines as directed, maintains low cholesterol diet, follows up as directed, and maintains exercise regimen.  Patient is using crestor without problems.   Current Outpatient Medications on File Prior to Visit  Medication Sig Dispense Refill  . ALPRAZolam (XANAX) 0.5 MG tablet TAKE 1 TABLET(0.5 MG) BY MOUTH DAILY AS NEEDED 30 tablet 3  . aspirin EC 81 MG tablet Take 81 mg by mouth daily.    . bethanechol (URECHOLINE) 25 MG tablet Take 25 mg by mouth daily.    . celecoxib (CELEBREX) 200 MG capsule daily.    Marland Kitchen docusate sodium (COLACE) 100 MG capsule Take 100 mg by mouth 2 (two) times daily.    . furosemide (LASIX) 20 MG tablet TAKE 1 TABLET(20 MG) BY MOUTH 1 TIME PER DAY 30 tablet 6  . gabapentin (NEURONTIN)  600 MG tablet TAKE 1 TABLET BY MOUTH THREE TIMES DAILY 270 tablet 2  . ibuprofen (ADVIL) 600 MG tablet 3 (three) times daily.    Marland Kitchen JARDIANCE 10 MG TABS tablet TAKE 1 TABLET BY MOUTH DAILY 90 tablet 2  . LINZESS 145 MCG CAPS capsule TAKE 1 CAPSULE BY MOUTH DAILY 30 MINUTES BEFORE THE FIRST MEAL OF THE DAY 90 capsule 2  . lisinopril (ZESTRIL) 10 MG tablet TAKE 1 TABLET(10 MG) BY MOUTH DAILY 90 tablet 2  . loratadine (CLARITIN) 10 MG tablet Take 10 mg by mouth daily as needed for allergies.    . naloxone (NARCAN) 0.4 MG/ML injection Inject 1 mL (0.4 mg total) into the vein as needed. 1 mL 3  . Omega-3 Fatty Acids (FISH OIL) 1000 MG CAPS Take 1 capsule by mouth daily.    Marland Kitchen omeprazole (PRILOSEC) 20 MG capsule TAKE ONE CAPSULE BY MOUTH DAILY 90 capsule 2  . oxyCODONE (ROXICODONE) 15 MG immediate release tablet Take 15 mg by mouth every 4 (four) hours as needed.    . rosuvastatin (CRESTOR) 10 MG tablet TAKE 1 TABLET(10 MG) BY MOUTH EVERY DAY 90 tablet 2  . tamsulosin (FLOMAX) 0.4 MG CAPS capsule Take 0.4 mg by mouth daily.     No current facility-administered medications on file prior to visit.   Past Medical History:  Diagnosis Date  . Arthritis   . Depression   . Hyperlipidemia   . Prostate enlargement    Past Surgical History:  Procedure Laterality Date  . CYSTOSCOPY WITH INSERTION OF UROLIFT    .  FRACTURE SURGERY    . SPINE SURGERY    . TOTAL HIP ARTHROPLASTY      History reviewed. No pertinent family history. Social History   Socioeconomic History  . Marital status: Widowed    Spouse name: Not on file  . Number of children: Not on file  . Years of education: Not on file  . Highest education level: Not on file  Occupational History  . Not on file  Tobacco Use  . Smoking status: Never Smoker  . Smokeless tobacco: Never Used  Vaping Use  . Vaping Use: Never used  Substance and Sexual Activity  . Alcohol use: Never  . Drug use: Never  . Sexual activity: Not on file  Other  Topics Concern  . Not on file  Social History Narrative  . Not on file   Social Determinants of Health   Financial Resource Strain: Not on file  Food Insecurity: Not on file  Transportation Needs: Not on file  Physical Activity: Not on file  Stress: Not on file  Social Connections: Not on file    Review of Systems  Constitutional: Negative for chills, diaphoresis, fatigue and fever.  HENT: Negative for congestion, ear pain and sore throat.   Respiratory: Negative for cough and shortness of breath.   Cardiovascular: Negative for chest pain and leg swelling.  Gastrointestinal: Negative for abdominal pain, constipation, diarrhea, nausea and vomiting.  Genitourinary: Negative for dysuria and urgency.  Musculoskeletal: Negative for arthralgias and myalgias.  Skin: Negative.   Neurological: Negative for dizziness and headaches.  Psychiatric/Behavioral: Negative for dysphoric mood.     Objective:  BP 130/70   Pulse (!) 57   Temp 97.6 F (36.4 C)   Ht 6' (1.829 m)   Wt 296 lb (134.3 kg)   SpO2 99%   BMI 40.14 kg/m   BP/Weight 03/21/2020 11/17/2019 0/10/3816  Systolic BP 299 371 696  Diastolic BP 70 72 72  Wt. (Lbs) 296 281 285  BMI 40.14 38.11 38.65    Physical Exam Vitals reviewed.  Constitutional:      Appearance: Normal appearance.  HENT:     Head: Normocephalic.     Right Ear: Tympanic membrane, ear canal and external ear normal.     Left Ear: Tympanic membrane, ear canal and external ear normal.     Mouth/Throat:     Mouth: Mucous membranes are moist.     Pharynx: Oropharynx is clear.  Eyes:     Extraocular Movements: Extraocular movements intact.     Conjunctiva/sclera: Conjunctivae normal.     Pupils: Pupils are equal, round, and reactive to light.  Cardiovascular:     Rate and Rhythm: Normal rate and regular rhythm.     Pulses: Normal pulses.     Heart sounds: Normal heart sounds. No murmur heard. No gallop.   Pulmonary:     Effort: Pulmonary effort  is normal. No respiratory distress.     Breath sounds: Normal breath sounds. No rales.  Abdominal:     General: Abdomen is flat. Bowel sounds are normal. There is no distension.     Palpations: Abdomen is soft.     Tenderness: There is no abdominal tenderness.  Musculoskeletal:        General: Tenderness (back) present. Normal range of motion.     Cervical back: Normal range of motion and neck supple.  Skin:    General: Skin is warm.     Capillary Refill: Capillary refill takes less than 2 seconds.  Neurological:     General: No focal deficit present.     Mental Status: He is alert and oriented to person, place, and time. Mental status is at baseline.     Diabetic Foot Exam - Simple   Simple Foot Form Diabetic Foot exam was performed with the following findings: Yes 03/21/2020  7:49 AM  Visual Inspection See comments: Yes Sensation Testing See comments: Yes Pulse Check Posterior Tibialis and Dorsalis pulse intact bilaterally: Yes Comments Hammer toe, decreased sensation      Lab Results  Component Value Date   WBC 3.3 (L) 11/17/2019   HGB 14.9 11/17/2019   HCT 44.3 11/17/2019   PLT 168 11/17/2019   GLUCOSE 108 (H) 11/17/2019   CHOL 169 11/17/2019   TRIG 67 11/17/2019   HDL 56 11/17/2019   LDLCALC 100 (H) 11/17/2019   ALT 20 11/17/2019   AST 21 11/17/2019   NA 142 11/17/2019   K 4.5 11/17/2019   CL 100 11/17/2019   CREATININE 0.77 11/17/2019   BUN 11 11/17/2019   CO2 25 11/17/2019   INR 0.96 06/25/2009   HGBA1C 6.1 (H) 11/17/2019      Assessment & Plan:   1. Diabetic polyneuropathy associated with type 2 diabetes mellitus (HCC) - Hemoglobin A1c An individual care plan for diabetes was established and reinforced today.  The patient's status was assessed using clinical findings on exam, labs and diagnostic testing. Patient success at meeting goals based on disease specific evidence-based guidelines and found to be good controlled. Medications were assessed  and patient's understanding of the medical issues , including barriers were assessed. Recommend adherence to a diabetic diet, a graduated exercise program, HgbA1c level is checked quarterly, and urine microalbumin performed yearly .  Annual mono-filament sensation testing performed. Lower blood pressure and control hyperlipidemia is important. Get annual eye exams and annual flu shots and smoking cessation discussed.  Self management goals were discussed.  2. Essential hypertension, benign - CBC with Differential/Platelet - Comprehensive metabolic panel An individual hypertension care plan was established and reinforced today.  The patient's status was assessed using clinical findings on exam and labs or diagnostic tests. The patient's success at meeting treatment goals on disease specific evidence-based guidelines and found to be well controlled. SELF MANAGEMENT: The patient and I together assessed ways to personally work towards obtaining the recommended goals. RECOMMENDATIONS: avoid decongestants found in common cold remedies, decrease consumption of alcohol, perform routine monitoring of BP with home BP cuff, exercise, reduction of dietary salt, take medicines as prescribed, try not to miss doses and quit smoking.  Regular exercise and maintaining a healthy weight is needed.  Stress reduction may help. A CLINICAL SUMMARY including written plan identify barriers to care unique to individual due to social or financial issues.  We attempt to mutually creat solutions for individual and family understanding.  3. Enlarged prostate AN INDIVIDUAL CARE PLAN BPH was established and reinforced today.  The patient's status was assessed using clinical findings on exam, labs, and other diagnostic testing. Patient's success at meeting treatment goals based on disease specific evidence-bassed guidelines and found to be in fair control. RECOMMENDATIONS include maintaining present medicines and treatment.  4. Mixed  hyperlipidemia - Lipid panel AN INDIVIDUAL CARE PLAN for hyperlipidemia/ cholesterol was established and reinforced today.  The patient's status was assessed using clinical findings on exam, lab and other diagnostic tests. The patient's disease status was assessed based on evidence-based guidelines and found to be fair controlled. MEDICATIONS were reviewed. SELF  MANAGEMENT GOALS have been discussed and patient's success at attaining the goal of low cholesterol was assessed. RECOMMENDATION given include regular exercise 3 days a week and low cholesterol/low fat diet. CLINICAL SUMMARY including written plan to identify barriers unique to the patient due to social or economic  reasons was discussed.      Orders Placed This Encounter  Procedures  . CBC with Differential/Platelet  . Comprehensive metabolic panel  . Hemoglobin A1c  . Lipid panel      I spent 30 minutes dedicated to the care of this patient on the date of this encounter to include face-to-face time with the patient, as well as:   Follow-up: Return in about 3 months (around 06/18/2020).  An After Visit Summary was printed and given to the patient.  Reinaldo Meeker, MD [facility] [phone]

## 2020-03-22 LAB — CBC WITH DIFFERENTIAL/PLATELET
Basophils Absolute: 0 10*3/uL (ref 0.0–0.2)
Basos: 1 %
EOS (ABSOLUTE): 0.1 10*3/uL (ref 0.0–0.4)
Eos: 3 %
Hematocrit: 45.8 % (ref 37.5–51.0)
Hemoglobin: 15.5 g/dL (ref 13.0–17.7)
Immature Grans (Abs): 0 10*3/uL (ref 0.0–0.1)
Immature Granulocytes: 0 %
Lymphocytes Absolute: 1.6 10*3/uL (ref 0.7–3.1)
Lymphs: 52 %
MCH: 31.1 pg (ref 26.6–33.0)
MCHC: 33.8 g/dL (ref 31.5–35.7)
MCV: 92 fL (ref 79–97)
Monocytes Absolute: 0.3 10*3/uL (ref 0.1–0.9)
Monocytes: 10 %
Neutrophils Absolute: 1.1 10*3/uL — ABNORMAL LOW (ref 1.4–7.0)
Neutrophils: 34 %
Platelets: 175 10*3/uL (ref 150–450)
RBC: 4.99 x10E6/uL (ref 4.14–5.80)
RDW: 13 % (ref 11.6–15.4)
WBC: 3.1 10*3/uL — ABNORMAL LOW (ref 3.4–10.8)

## 2020-03-22 LAB — COMPREHENSIVE METABOLIC PANEL
ALT: 22 IU/L (ref 0–44)
AST: 25 IU/L (ref 0–40)
Albumin/Globulin Ratio: 2.1 (ref 1.2–2.2)
Albumin: 4.8 g/dL (ref 3.8–4.9)
Alkaline Phosphatase: 75 IU/L (ref 44–121)
BUN/Creatinine Ratio: 16 (ref 10–24)
BUN: 13 mg/dL (ref 8–27)
Bilirubin Total: 0.8 mg/dL (ref 0.0–1.2)
CO2: 23 mmol/L (ref 20–29)
Calcium: 9.6 mg/dL (ref 8.6–10.2)
Chloride: 101 mmol/L (ref 96–106)
Creatinine, Ser: 0.81 mg/dL (ref 0.76–1.27)
GFR calc Af Amer: 112 mL/min/{1.73_m2} (ref >59)
GFR calc non Af Amer: 97 mL/min/{1.73_m2} (ref >59)
Globulin, Total: 2.3 g/dL (ref 1.5–4.5)
Glucose: 104 mg/dL — ABNORMAL HIGH (ref 65–99)
Potassium: 4.8 mmol/L (ref 3.5–5.2)
Sodium: 139 mmol/L (ref 134–144)
Total Protein: 7.1 g/dL (ref 6.0–8.5)

## 2020-03-22 LAB — LIPID PANEL
Chol/HDL Ratio: 2.9 ratio (ref 0.0–5.0)
Cholesterol, Total: 167 mg/dL (ref 100–199)
HDL: 58 mg/dL (ref >39)
LDL Chol Calc (NIH): 96 mg/dL (ref 0–99)
Triglycerides: 70 mg/dL (ref 0–149)
VLDL Cholesterol Cal: 13 mg/dL (ref 5–40)

## 2020-03-22 LAB — HEMOGLOBIN A1C
Est. average glucose Bld gHb Est-mCnc: 134 mg/dL
Hgb A1c MFr Bld: 6.3 % — ABNORMAL HIGH (ref 4.8–5.6)

## 2020-03-22 LAB — CARDIOVASCULAR RISK ASSESSMENT

## 2020-03-22 NOTE — Progress Notes (Signed)
Ebc remain on low side, no changes, glucose 104, kidney tests normal, liver tests normal, A1c 6.3, LDL cholesterol is 100,  lp

## 2020-04-11 ENCOUNTER — Telehealth: Payer: Self-pay

## 2020-04-11 ENCOUNTER — Other Ambulatory Visit: Payer: Self-pay | Admitting: Legal Medicine

## 2020-04-11 DIAGNOSIS — M5459 Other low back pain: Secondary | ICD-10-CM | POA: Diagnosis not present

## 2020-04-11 MED ORDER — LEVOFLOXACIN 500 MG PO TABS: 500.0000 mg | ORAL_TABLET | Freq: Every day | ORAL | 4 refills | Status: DC

## 2020-04-11 NOTE — Telephone Encounter (Signed)
I left detailed message on voicemail to call us back. 

## 2020-04-11 NOTE — Telephone Encounter (Signed)
Patient called stating Dr. Comer Locket has retired and he had been prescribing him Levofloxacin 500 mg PRN for his kidneys. Patient states he takes it for three days at a time about 1-2 times a month when his kidneys "act up." Patient is asking if you would be able to refill this medication for him now since he can no longer get it from [facility].

## 2020-04-11 NOTE — Telephone Encounter (Signed)
Sent in medicines lp

## 2020-04-18 NOTE — Progress Notes (Addendum)
Chronic Care Management Pharmacy Note  04/25/2020 Name:  Brad Johnson MRN:  [id number] DOB:  [birth year]   Plan recommendations:   Patient is requesting 30 tablets per fill of Levaquin like Dr. Comer Locket previously prescribed. He is planning to call Dr. Nila Nephew to establish care but has not at this time.   Patient is a candidate and willing to increase Crestor dose if Dr. Henrene Pastor approves.   Subjective: Brad Johnson is an 60 y.o. year old male who is a primary patient of Henrene Pastor, Zeb Comfort, MD.  The CCM team was consulted for assistance with disease management and care coordination needs.    Engaged with patient by telephone for initial visit in response to provider referral for pharmacy case management and/or care coordination services.   Consent to Services:  The patient was given the following information about Chronic Care Management services today, agreed to services, and gave verbal consent: 1. CCM service includes personalized support from designated clinical staff supervised by the primary care provider, including individualized plan of care and coordination with other care providers 2. 24/7 contact phone numbers for assistance for urgent and routine care needs. 3. Service will only be billed when office clinical staff spend 20 minutes or more in a month to coordinate care. 4. Only one practitioner may furnish and bill the service in a calendar month. 5.The patient may stop CCM services at any time (effective at the end of the month) by phone call to the office staff. 6. The patient will be responsible for cost sharing (co-pay) of up to 20% of the service fee (after annual deductible is met). Patient agreed to services and consent obtained.  Patient Care Team: Lillard Anes, MD as PCP - General (Family Medicine) Burnice Logan, [facility] as Pharmacist (Pharmacist)  Recent office visits: 03/21/2020 - no changes, glucose 104, kidney tests normal, liver tests normal, A1c 6.3, LDL  cholesterol is 100 11/17/2019 - flu vaccine. WBC still 3,300, no anemia, Glucose 108, kidney tests normal, liver tests normal, A1c 6.1, LDL cholesterol 100.   Recent consult visits: 04/11/2020 - ortho - chronic lower back pain. Stable on current medication at this time  12/13/2019 - ortho - chronic lower back pain. Urine drug screen in office.  Hospital visits: None in previous 6 months  Objective:  Lab Results  Component Value Date   CREATININE 0.81 03/21/2020   BUN 13 03/21/2020   GFRNONAA 97 03/21/2020   GFRAA 112 03/21/2020   NA 139 03/21/2020   K 4.8 03/21/2020   CALCIUM 9.6 03/21/2020   CO2 23 03/21/2020   GLUCOSE 104 (H) 03/21/2020    Lab Results  Component Value Date/Time   HGBA1C 6.3 (H) 03/21/2020 07:53 AM   HGBA1C 6.1 (H) 11/17/2019 08:16 AM    Last diabetic Eye exam: No results found for: HMDIABEYEEXA  Last diabetic Foot exam: No results found for: HMDIABFOOTEX   Lab Results  Component Value Date   CHOL 167 03/21/2020   HDL 58 03/21/2020   LDLCALC 96 03/21/2020   TRIG 70 03/21/2020   CHOLHDL 2.9 03/21/2020    Hepatic Function Latest Ref Rng & Units 03/21/2020 11/17/2019 07/18/2019  Total Protein 6.0 - 8.5 g/dL 7.1 7.0 7.1  Albumin 3.8 - 4.9 g/dL 4.8 4.6 4.7  AST 0 - 40 IU/L _0 ALT 0 - 44 IU/L _1 Alk Phosphatase 44 - 121 IU/L 75 97 56  Total Bilirubin 0.0 - 1.2 mg/dL 0.8 0.7 0.6  No results found for: TSH, FREET4  CBC Latest Ref Rng & Units 03/21/2020 11/17/2019 07/18/2019  WBC 3.4 - 10.8 x10E3/uL 3.1(L) 3.3(L) 2.6(L)  Hemoglobin 13.0 - 17.7 g/dL 15.5 14.9 15.7  Hematocrit 37.5 - 51.0 % 45.8 44.3 47.0  Platelets 150 - 450 x10E3/uL 175 168 166    No results found for: VD25OH  Clinical ASCVD: No  The 10-year ASCVD risk score Mikey Bussing DC Jr., et al., 2013) is: 14.7%   Values used to calculate the score:     Age: 35 years     Sex: Male     Is Non-Hispanic African American: No     Diabetic: Yes     Tobacco smoker: No     Systolic  Blood Pressure: 130 mmHg     Is BP treated: Yes     HDL Cholesterol: 58 mg/dL     Total Cholesterol: 167 mg/dL    Depression screen [facility] 2/9 11/17/2019 07/18/2019 06/08/2018  Decreased Interest 0 3 0  Down, Depressed, Hopeless 0 0 1  PHQ - 2 Score 0 3 1  Altered sleeping 0 0 -  Tired, decreased energy 0 0 -  Change in appetite 0 0 -  Feeling bad or failure about yourself  0 0 -  Trouble concentrating 0 0 -  Moving slowly or fidgety/restless - 0 -  Suicidal thoughts 0 0 -  PHQ-9 Score 0 3 -  Difficult doing work/chores Not difficult at all Not difficult at all -     Social History   Tobacco Use  Smoking Status Never Smoker  Smokeless Tobacco Never Used   BP Readings from Last 3 Encounters:  03/21/20 130/70  11/17/19 114/72  07/18/19 118/72   Pulse Readings from Last 3 Encounters:  03/21/20 (!) 57  11/17/19 (!) 50  07/18/19 (!) 52   Wt Readings from Last 3 Encounters:  03/21/20 296 lb (134.3 kg)  11/17/19 281 lb (127.5 kg)  07/18/19 285 lb (129.3 kg)   BMI Readings from Last 3 Encounters:  03/21/20 40.14 kg/m  11/17/19 38.11 kg/m  07/18/19 38.65 kg/m    Assessment/Interventions: Review of patient past medical history, allergies, medications, health status, including review of consultants reports, laboratory and other test data, was performed as part of comprehensive evaluation and provision of chronic care management services.   SDOH:  (Social Determinants of Health) assessments and interventions performed: Yes   CCM Care Plan  No Known Allergies  Medications Reviewed Today    Reviewed by Burnice Logan, [facility] (Pharmacist) on 04/24/20 at 0857  Med List Status: <None>  Medication Order Taking? Sig Documenting Provider Last Dose Status Informant  ALPRAZolam (XANAX) 0.5 MG tablet [id number] Yes TAKE 1 TABLET(0.5 MG) BY MOUTH DAILY AS NEEDED  Patient taking differently: Take 0.5 mg by mouth daily as needed.   Lillard Anes, MD Taking Active   aspirin EC 81  MG tablet [id number] Yes Take 81 mg by mouth daily. [provider] Taking Active Self  bethanechol (URECHOLINE) 25 MG tablet [id number] No Take 25 mg by mouth daily.  Patient not taking: Reported on 04/24/2020   [provider] Not Taking Consider Medication Status and Discontinue Self  celecoxib (CELEBREX) 200 MG capsule [id number] Yes daily. [provider] Taking Active   docusate sodium (COLACE) 100 MG capsule [id number] Yes Take 100 mg by mouth 2 (two) times daily. [provider] Taking Active Self  furosemide (LASIX) 20 MG tablet [id number] Yes TAKE 1 TABLET(20 MG) BY MOUTH 1  TIME PER DAY  Patient taking differently: Take 20 mg by mouth daily as needed.   Lillard Anes, MD Taking Active   gabapentin (NEURONTIN) 600 MG tablet [id number] Yes TAKE 1 TABLET BY MOUTH THREE TIMES DAILY Lillard Anes, MD Taking Active   JARDIANCE 10 MG TABS tablet [id number] Yes TAKE 1 TABLET BY MOUTH DAILY Lillard Anes, MD Taking Active   levofloxacin [facility]) 500 MG tablet [id number] Yes Take 1 tablet (500 mg total) by mouth daily. Use with uti Lillard Anes, MD Taking Active   LINZESS 145 MCG CAPS capsule [id number] Yes TAKE 1 CAPSULE BY MOUTH DAILY 30 MINUTES BEFORE THE FIRST MEAL OF THE DAY Lillard Anes, MD Taking Active   lisinopril (ZESTRIL) 10 MG tablet [id number] Yes TAKE 1 TABLET(10 MG) BY MOUTH DAILY Lillard Anes, MD Taking Active   loratadine (CLARITIN) 10 MG tablet [id number] Yes Take 10 mg by mouth daily as needed for allergies. [provider] Taking Active Self  naloxone [facility]) 0.4 MG/ML injection [id number] No Inject 1 mL (0.4 mg total) into the vein as needed.  Patient not taking: Reported on 04/24/2020   Lillard Anes, MD Not Taking Active   Omega-3 Fatty Acids (FISH OIL) 1000 MG CAPS [id number] Yes Take 1 capsule by mouth daily. [provider] Taking Active Self  omeprazole (PRILOSEC) 20  MG capsule [id number] Yes TAKE ONE CAPSULE BY MOUTH DAILY Lillard Anes, MD Taking Active   oxyCODONE (ROXICODONE) 15 MG immediate release tablet [id number] Yes Take 15 mg by mouth every 4 (four) hours as needed. [provider] Taking Active   rosuvastatin (CRESTOR) 10 MG tablet [id number] Yes TAKE 1 TABLET(10 MG) BY MOUTH EVERY DAY Lillard Anes, MD Taking Active   tamsulosin [facility]) 0.4 MG CAPS capsule [id number] No Take 0.4 mg by mouth daily.  Patient not taking: Reported on 04/24/2020   [provider] Not Taking Consider Medication Status and Discontinue Self          Patient Active Problem List   Diagnosis Date Noted  . BMI 38.0-38.9,adult 07/18/2019  . Adjustment disorder with depressed mood 03/19/2019  . CHI (closed head injury), sequela 03/19/2019  . Illiteracy 03/19/2019  . Diabetic polyneuropathy ([place]) 03/19/2019  . Encounter for long-term use of opiate analgesic 03/19/2019  . Diabetes ([place]) 06/08/2018  . Mixed hyperlipidemia 06/08/2018  . Essential hypertension, benign 06/08/2018  . Trochanteric bursitis of right hip 06/08/2018  . History of total left hip arthroplasty 06/08/2018  . Osteoarthritis 06/08/2018  . Irritable bowel syndrome 06/08/2018  . Polyneuropathy 06/08/2018  . Enlarged prostate 06/08/2018  . Esophageal reflux 06/08/2018  . Dysthymic disorder 06/08/2018  . Disc displacement, lumbar 06/08/2018  . Presenile dementia ([place]) 06/08/2018  . Enlarged prostate with urinary obstruction 06/08/2018    Immunization History  Administered Date(s) Administered  . Influenza Inj Mdck Quad Pf 11/17/2019    Conditions to be addressed/monitored:  Hypertension, Hyperlipidemia, Diabetes, BPH and dementia, osteoarthritis, polyneuropathy.   Care Plan : [place]  Updates made by Burnice Logan, [facility] since 04/25/2020 12:00 AM    Problem: DM, HLD, HTN   Priority: High  Onset Date: 04/24/2020    Long-Range Goal: disease  management   Start Date: 04/24/2020  Expected End Date: 04/24/2021  This Visit's Progress: On track  Priority: High  Note:   Current Barriers:  . Suboptimal therapeutic regimen for cholesterol management  Pharmacist Clinical Goal(s):  Marland Kitchen Patient will adhere to plan to  optimize therapeutic regimen for cholesterol as evidenced by report of adherence to recommended medication management changes . adhere to prescribed medication regimen as evidenced by updated lipid panel results through collaboration with PharmD and provider.   Interventions: . 1:1 collaboration with Lillard Anes, MD regarding development and update of comprehensive plan of care as evidenced by provider attestation and co-signature . Inter-disciplinary care team collaboration (see longitudinal plan of care) . Comprehensive medication review performed; medication list updated in electronic medical record  Hypertension (BP goal <130/80) -Controlled -Current treatment: . lisinopril 10 mg daily  . Furosemide 20 mg daily prn  -Medications previously tried: none reported  -Current home readings: well controlled but not recorded  -Current dietary habits: cooks at home. Likes vegetable plate for supper. Eats a lot of fruit. Does eat some bacon/sausage with eggs for breakfast.  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  -Denies hypotensive/hypertensive symptoms -Educated on BP goals and benefits of medications for prevention of heart attack, stroke and kidney damage; Daily salt intake goal < 2300 mg; Exercise goal of 150 minutes per week; Importance of home blood pressure monitoring; -Counseled to monitor BP at home weekly, document, and provide log at future appointments -Counseled on diet and exercise extensively Recommended to continue current medication  Hyperlipidemia: (LDL goal < 70) -Not ideally controlled -Current treatment: . rosuvastatin 10 mg daily -  recommend increase in dose to 20 mg daily . Omega 3 fish oil daily  -Medications previously tried: none reported  -Current dietary patterns: eats a lot of fruit. Eats mainly vegetables for supper. -Current exercise habits: stays active around [place]. [place]  -Educated on Cholesterol goals;  Benefits of statin for ASCVD risk reduction; Importance of limiting foods high in cholesterol; Exercise goal of 150 minutes per week; -Counseled on diet and exercise extensively Recommended increasing Crestor to 20 mg daily.   Diabetes (A1c goal <7%) -Controlled -Current medications: . Jardiance 10 mg daily  -Medications previously tried: none reproted  -Current home glucose readings . fasting glucose: 106, 102, 120, 118 . post prandial glucose: not checking  -Denies hypoglycemic/hyperglycemic symptoms -Current meal patterns:  . breakfast:  bacon/sausage and eggs, cheerios/raisin bran or shake . lunch:  fruit (banana, apple, peach or pear) . dinner:  eats around 3 pm - vegetable plate typically . snacks:  occasional oatmeal cookie. Fruit.  Marland Kitchen drinks: drinks strawberry protein shakes/ensure -Current exercise: works around farm or fishes.  -Educated on A1c and blood sugar goals; Complications of diabetes including kidney damage, retinal damage, and cardiovascular disease; Exercise goal of 150 minutes per week; Benefits of weight loss; Benefits of routine self-monitoring of blood sugar; Carbohydrate counting and/or plate method -Counseled to check feet daily and get yearly eye exams -Counseled on diet and exercise extensively Recommended to continue current medication Recommended patient continuing to check blood sugar and record in log.  Educated on benefits of blood sugar management and importance of taking medication daily even though a1c is well controlled.   Chronic Low Back Pain/ Osteoarthritis (Goal: manage back and hip pain) -Controlled -Current treatment   . Celecoxib 200 mg daily  . Oxycodone 15 mg every 4 hours prn . Naloxone 0.4 mg/ml inject prn  . Gabapentni 600 mg tid  -Medications previously tried: ibuprofen  -Counseled on diet and exercise extensively Recommended to continue current medication   Enlarged Prostate with Urinary Obstruction  (Goal: manage symptoms after urolift procedure) -Controlled -Current  treatment  . Levofloxacin 500 mg daily for UTI prn. -Medications previously tried: bethanechol, tamsulosin -Collaborated with Dr. Henrene Pastor to discuss patient's prescription for Levaquin.  Patient reports since having prostate surgery to band and improve flow that he gets irritation/UTI symptoms periodically. Dr. Comer Locket previously prescribed 30 tablets of Levaquin for patient to use as needed for irritation and pain.   Adjustment with Depressed mood (Goal: manage anxiety) -Controlled -Current treatment  . alprazolam 0.5 mg daily prn - rarely uses -Medications previously tried: none reported  -Counseled on danger of using with narcotics.  Patient reports minimizing tv and world news to help with anxiety. Patient reports that he has not taken any in last 2 weeks. 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 Patient reports that watching world news increases his anxiety so he avoids.   IBS with Reflux (Goal: control symptoms of IBS) -Controlled -Current treatment  . docusate 100 mg bid prn . Linzess 145 mcg daily 30 minutes before the first meal of the day . Omeprazole 20 mg daily  -Medications previously tried: none reported  -Counseled on diet and exercise extensively Recommended to continue current medication Educated on smaller meals and avoiding trigger foods   Patient Goals/Self-Care Activities . Patient will:  - take medications as prescribed focus on medication adherence by continuing to use chart and pill box check glucose 2-3 times weekly, document, and provide at future  appointments target a minimum of 150 minutes of moderate intensity exercise weekly engage in dietary modifications by focusing on lean protein, vegetables and fruit.   Follow Up Plan: Telephone follow up appointment with care management team member scheduled for: 04/2021      Medication Assistance: None required.  Patient affirms current coverage meets needs.Patient reports medications are affordable.   Patient's preferred pharmacy is:  [id number], [place] - [address] AT [place] [address] [place] [address] Phone: [phone] Fax: [phone]  Uses pill box? Yes   Pt endorses good compliance  We discussed: Benefits of medication synchronization, packaging and delivery as well as enhanced pharmacist oversight with [employer]. Patient decided to: Continue current medication management strategy  Care Plan and Follow Up Patient Decision:  Patient agrees to Care Plan and Follow-up.  Plan: Telephone follow up appointment with care management team member scheduled for:  04/2021

## 2020-04-19 ENCOUNTER — Telehealth: Payer: Self-pay

## 2020-04-19 NOTE — Progress Notes (Signed)
    Chronic Care Management Pharmacy Assistant   Name: Brad Johnson  MRN: [id number] DOB: 18-Feb-1960   Reason for Encounter: Initial questions for Brad Johnson, CPP    Medications: Outpatient Encounter Medications as of 04/19/2020  Medication Sig  . ALPRAZolam (XANAX) 0.5 MG tablet TAKE 1 TABLET(0.5 MG) BY MOUTH DAILY AS NEEDED  . aspirin EC 81 MG tablet Take 81 mg by mouth daily.  . bethanechol (URECHOLINE) 25 MG tablet Take 25 mg by mouth daily.  . celecoxib (CELEBREX) 200 MG capsule daily.  Marland Kitchen docusate sodium (COLACE) 100 MG capsule Take 100 mg by mouth 2 (two) times daily.  . furosemide (LASIX) 20 MG tablet TAKE 1 TABLET(20 MG) BY MOUTH 1 TIME PER DAY  . gabapentin (NEURONTIN) 600 MG tablet TAKE 1 TABLET BY MOUTH THREE TIMES DAILY  . ibuprofen (ADVIL) 600 MG tablet 3 (three) times daily.  Marland Kitchen JARDIANCE 10 MG TABS tablet TAKE 1 TABLET BY MOUTH DAILY  . levofloxacin (LEVAQUIN) 500 MG tablet Take 1 tablet (500 mg total) by mouth daily. Use with uti  . LINZESS 145 MCG CAPS capsule TAKE 1 CAPSULE BY MOUTH DAILY 30 MINUTES BEFORE THE FIRST MEAL OF THE DAY  . lisinopril (ZESTRIL) 10 MG tablet TAKE 1 TABLET(10 MG) BY MOUTH DAILY  . loratadine (CLARITIN) 10 MG tablet Take 10 mg by mouth daily as needed for allergies.  . naloxone (NARCAN) 0.4 MG/ML injection Inject 1 mL (0.4 mg total) into the vein as needed.  . Omega-3 Fatty Acids (FISH OIL) 1000 MG CAPS Take 1 capsule by mouth daily.  Marland Kitchen omeprazole (PRILOSEC) 20 MG capsule TAKE ONE CAPSULE BY MOUTH DAILY  . oxyCODONE (ROXICODONE) 15 MG immediate release tablet Take 15 mg by mouth every 4 (four) hours as needed.  . rosuvastatin (CRESTOR) 10 MG tablet TAKE 1 TABLET(10 MG) BY MOUTH EVERY DAY  . tamsulosin (FLOMAX) 0.4 MG CAPS capsule Take 0.4 mg by mouth daily.   No facility-administered encounter medications on file as of 04/19/2020.   Have you seen any other providers since your last visit? 03/21/20-PCP  Any changes in your medications or  health? Patient stated he has a recent prostate flare up, but is better now.  He stated that he needed a whole script at a time just now a few pills.   Any side effects from any medications? Patient is not having having any side effects.  Do you have an symptoms or problems not managed by your medications? No issues noted at theis time.  Any concerns about your health right now? Patient just stated his recent prostate flare up.  Has your provider asked that you check blood pressure, blood sugar, or follow special diet at home? Patient stated he does check his blood pressure and blood sugars, he watches his diet.  Do you get any type of exercise on a regular basis? Patient stated he is very active  Can you think of a goal you would like to reach for your health? No specific goal noted.   Do you have any problems getting your medications? He has no issues getting his medication  Is there anything that you would like to discuss during the appointment? Patient stated just getting script for antibiotics for his next prostrate flare up.  Patient knows this is a phone appointment.  Clarita Leber, [place] Pharmacist Assistant [phone]

## 2020-04-24 ENCOUNTER — Ambulatory Visit (INDEPENDENT_AMBULATORY_CARE_PROVIDER_SITE_OTHER): Payer: PPO

## 2020-04-24 ENCOUNTER — Other Ambulatory Visit: Payer: Self-pay

## 2020-04-24 DIAGNOSIS — E1142 Type 2 diabetes mellitus with diabetic polyneuropathy: Secondary | ICD-10-CM

## 2020-04-24 DIAGNOSIS — I10 Essential (primary) hypertension: Secondary | ICD-10-CM | POA: Diagnosis not present

## 2020-04-24 DIAGNOSIS — E782 Mixed hyperlipidemia: Secondary | ICD-10-CM

## 2020-04-24 NOTE — Patient Instructions (Addendum)
Visit Information  Thank you for your time discussing your medications. I look forward to working with you to achieve your health care goals. Below is a summary of what we talked about during our visit.   Goals Addressed            This Visit's Progress   . Learn More About My Health       Timeframe:  Long-Range Goal Priority:  High Start Date:  04/24/20                      Expected End Date:                     04/24/2021   Follow Up Date 04/03/2021    - tell my story and reason for my visit - repeat what I heard to make sure I understand - bring a list of my medicines to the visit - speak up when I don't understand    Why is this important?    The best way to learn about your health and care is by talking to the doctor and nurse.   They will answer your questions and give you information in the way that you like best.    Notes:     Marland Kitchen Monitor and Manage My Blood Sugar-Diabetes Type 2       Timeframe:  Long-Range Goal Priority:  High Start Date:   04/24/20                           Expected End Date:   04/24/2021  Follow Up Date 04/03/2021    - check blood sugar at prescribed times - take the blood sugar log to all doctor visits    Why is this important?    Checking your blood sugar at home helps to keep it from getting very high or very low.   Writing the results in a diary or log helps the doctor know how to care for you.   Your blood sugar log should have the time, date and the results.   Also, write down the amount of insulin or other medicine that you take.   Other information, like what you ate, exercise done and how you were feeling, will also be helpful.     Notes:     . Track and Manage My Blood Pressure-Hypertension       Timeframe:  Long-Range Goal Priority:  High Start Date:    04/24/2020                         Expected End Date:  04/24/2021                     Follow Up Date 04/03/2021    - check blood pressure 3 times per week -  write blood pressure results in a log or diary    Why is this important?    You won't feel high blood pressure, but it can still hurt your blood vessels.   High blood pressure can cause heart or kidney problems. It can also cause a stroke.   Making lifestyle changes like losing a little weight or eating less salt will help.   Checking your blood pressure at home and at different times of the day can help to control blood pressure.   If the doctor prescribes medicine remember to take it  the way the doctor ordered.   Call the office if you cannot afford the medicine or if there are questions about it.     Notes:        Patient Care Plan: CCM Pharmacy Care Plan    Problem Identified: DM, HLD, HTN   Priority: High  Onset Date: 04/24/2020    Long-Range Goal: disease management   Start Date: 04/24/2020  Expected End Date: 04/24/2021  This Visit's Progress: On track  Priority: High  Note:   Current Barriers:  . Suboptimal therapeutic regimen for cholesterol management  Pharmacist Clinical Goal(s):  Marland Kitchen Patient will adhere to plan to optimize therapeutic regimen for cholesterol as evidenced by report of adherence to recommended medication management changes . adhere to prescribed medication regimen as evidenced by updated lipid panel results through collaboration with PharmD and provider.   Interventions: . 1:1 collaboration with Lillard Anes, MD regarding development and update of comprehensive plan of care as evidenced by provider attestation and co-signature . Inter-disciplinary care team collaboration (see longitudinal plan of care) . Comprehensive medication review performed; medication list updated in electronic medical record  Hypertension (BP goal <130/80) -Controlled -Current treatment: . lisinopril 10 mg daily  . Furosemide 20 mg daily prn  -Medications previously tried: none reported  -Current home readings: well controlled but not recorded  -Current  dietary habits: cooks at home. Likes vegetable plate for supper. Eats a lot of fruit. Does eat some bacon/sausage with eggs for breakfast.  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  -Denies hypotensive/hypertensive symptoms -Educated on BP goals and benefits of medications for prevention of heart attack, stroke and kidney damage; Daily salt intake goal < 2300 mg; Exercise goal of 150 minutes per week; Importance of home blood pressure monitoring; -Counseled to monitor BP at home weekly, document, and provide log at future appointments -Counseled on diet and exercise extensively Recommended to continue current medication  Hyperlipidemia: (LDL goal < 70) -Not ideally controlled -Current treatment: . rosuvastatin 10 mg daily - recommend increase in dose to 20 mg daily . Omega 3 fish oil daily  -Medications previously tried: none reported  -Current dietary patterns: eats a lot of fruit. Eats mainly vegetables for supper. -Current exercise habits: stays active [place]. [place]  -Educated on Cholesterol goals;  Benefits of statin for ASCVD risk reduction; Importance of limiting foods high in cholesterol; Exercise goal of 150 minutes per week; -Counseled on diet and exercise extensively Recommended increasing Crestor to 20 mg daily.   Diabetes (A1c goal <7%) -Controlled -Current medications: . Jardiance 10 mg daily  -Medications previously tried: none reproted  -Current home glucose readings . fasting glucose: 106, 102, 120, 118 . post prandial glucose: not checking  -Denies hypoglycemic/hyperglycemic symptoms -Current meal patterns:  . breakfast:  bacon/sausage and eggs, cheerios/raisin bran or shake . lunch:  fruit (banana, apple, peach or pear) . dinner:  eats around 3 pm - vegetable plate typically . snacks:  occasional oatmeal cookie. Fruit.  Marland Kitchen drinks: drinks strawberry protein  shakes/ensure -Current exercise: works around farm or fishes.  -Educated on A1c and blood sugar goals; Complications of diabetes including kidney damage, retinal damage, and cardiovascular disease; Exercise goal of 150 minutes per week; Benefits of weight loss; Benefits of routine self-monitoring of blood sugar; Carbohydrate counting and/or plate method -Counseled to check feet daily and get yearly eye exams -Counseled on diet and exercise extensively Recommended to  continue current medication Recommended patient continuing to check blood sugar and record in log.  Educated on benefits of blood sugar management and importance of taking medication daily even though a1c is well controlled.   Chronic Low Back Pain/ Osteoarthritis (Goal: manage back and hip pain) -Controlled -Current treatment  . Celecoxib 200 mg daily  . Oxycodone 15 mg every 4 hours prn . Naloxone 0.4 mg/ml inject prn  . Gabapentni 600 mg tid  -Medications previously tried: ibuprofen  -Counseled on diet and exercise extensively Recommended to continue current medication   Enlarged Prostate with Urinary Obstruction  (Goal: manage symptoms after urolift procedure) -Controlled -Current treatment  . Levofloxacin 500 mg daily for UTI prn. -Medications previously tried: bethanechol, tamsulosin -Collaborated with Dr. Henrene Pastor to discuss patient's prescription for Levaquin.  Patient reports since having prostate surgery to band and improve flow that he gets irritation/UTI symptoms periodically. Dr. Comer Locket previously prescribed 30 tablets of Levaquin for patient to use as needed for irritation and pain.   Adjustment with Depressed mood (Goal: manage anxiety) -Controlled -Current treatment  . alprazolam 0.5 mg daily prn - rarely uses -Medications previously tried: none reported  -Counseled on danger of using with narcotics.  Patient reports minimizing tv and world news to help with anxiety. Patient reports that he has not  taken any in last 2 weeks. 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 Patient reports that watching world news increases his anxiety so he avoids.   IBS with Reflux (Goal: control symptoms of IBS) -Controlled -Current treatment  . docusate 100 mg bid prn . Linzess 145 mcg daily 30 minutes before the first meal of the day . Omeprazole 20 mg daily  -Medications previously tried: none reported  -Counseled on diet and exercise extensively Recommended to continue current medication Educated on smaller meals and avoiding trigger foods   Patient Goals/Self-Care Activities . Patient will:  - take medications as prescribed focus on medication adherence by continuing to use chart and pill box check glucose 2-3 times weekly, document, and provide at future appointments target a minimum of 150 minutes of moderate intensity exercise weekly engage in dietary modifications by focusing on lean protein, vegetables and fruit.   Follow Up Plan: Telephone follow up appointment with care management team member scheduled for: 04/2021      Mr. Seelig was given information about Chronic Care Management services today including:  1. CCM service includes personalized support from designated clinical staff supervised by his physician, including individualized plan of care and coordination with other care providers 2. 24/7 contact phone numbers for assistance for urgent and routine care needs. 3. Standard insurance, coinsurance, copays and deductibles apply for chronic care management only during months in which we provide at least 20 minutes of these services. Most insurances cover these services at 100%, however patients may be responsible for any copay, coinsurance and/or deductible if applicable. This service may help you avoid the need for more expensive face-to-face services. 4. Only one practitioner may furnish and bill the service in a calendar month. 5. The patient  may stop CCM services at any time (effective at the end of the month) by phone call to the office staff.  Patient agreed to services and verbal consent obtained.   The patient verbalized understanding of instructions, educational materials, and care plan provided today and declined offer to receive copy of patient instructions, educational materials, and care plan.  Telephone follow up appointment with pharmacy team  member scheduled for: 04/03/2021  Sherre Poot, PharmD Clinical Pharmacist [facility] [phone] (office) [phone] (mobile)  Blood Pressure Record Sheet To take your blood pressure, you will need a blood pressure machine. You can buy a blood pressure machine (blood pressure monitor) at your clinic, drug store, or online. When choosing one, consider:  An automatic monitor that has an arm cuff.  A cuff that wraps snugly around your upper arm. You should be able to fit only one finger between your arm and the cuff.  A device that stores blood pressure reading results.  Do not choose a monitor that measures your blood pressure from your wrist or finger. Follow your health care provider's instructions for how to take your blood pressure. To use this form:  Get one reading in the morning (a.m.) before you take any medicines.  Get one reading in the evening (p.m.) before supper.  Take at least 2 readings with each blood pressure check. This makes sure the results are correct. Wait 1-2 minutes between measurements.  Write down the results in the spaces on this form.  Repeat this once a week, or as told by your health care provider.  Make a follow-up appointment with your health care provider to discuss the results. Blood pressure log Date: _______________________  a.m. _____________________(1st reading) _____________________(2nd reading)  p.m. _____________________(1st reading) _____________________(2nd reading) Date: _______________________  a.m.  _____________________(1st reading) _____________________(2nd reading)  p.m. _____________________(1st reading) _____________________(2nd reading) Date: _______________________  a.m. _____________________(1st reading) _____________________(2nd reading)  p.m. _____________________(1st reading) _____________________(2nd reading) Date: _______________________  a.m. _____________________(1st reading) _____________________(2nd reading)  p.m. _____________________(1st reading) _____________________(2nd reading) Date: _______________________  a.m. _____________________(1st reading) _____________________(2nd reading)  p.m. _____________________(1st reading) _____________________(2nd reading) This information is not intended to replace advice given to you by your health care provider. Make sure you discuss any questions you have with your health care provider. Document Revised: 05/10/2019 Document Reviewed: 05/10/2019 Elsevier Patient Education  2021 [employer].

## 2020-06-14 ENCOUNTER — Other Ambulatory Visit: Payer: Self-pay | Admitting: Legal Medicine

## 2020-06-14 DIAGNOSIS — K5909 Other constipation: Secondary | ICD-10-CM

## 2020-06-20 ENCOUNTER — Other Ambulatory Visit: Payer: Self-pay | Admitting: Legal Medicine

## 2020-06-26 ENCOUNTER — Other Ambulatory Visit: Payer: Self-pay

## 2020-06-26 MED ORDER — CELECOXIB 200 MG PO CAPS: 200.0000 mg | ORAL_CAPSULE | Freq: Every day | ORAL | 6 refills | Status: DC

## 2020-06-26 NOTE — Telephone Encounter (Signed)
Pt states the provider that was prescribing this has now moved and it was the second provider at that practice. He would prefer not to go back to that office if Dr would take over bursitis medication. Please advise.   Royce Macadamia, [place] 06/26/20 3:06 PM

## 2020-07-11 DIAGNOSIS — G894 Chronic pain syndrome: Secondary | ICD-10-CM | POA: Diagnosis not present

## 2020-07-19 ENCOUNTER — Ambulatory Visit (INDEPENDENT_AMBULATORY_CARE_PROVIDER_SITE_OTHER): Payer: PPO | Admitting: Legal Medicine

## 2020-07-19 ENCOUNTER — Other Ambulatory Visit: Payer: Self-pay

## 2020-07-19 ENCOUNTER — Encounter: Payer: Self-pay | Admitting: Legal Medicine

## 2020-07-19 VITALS — BP 120/76 | HR 54 | Temp 97.7°F | Resp 16 | Ht 72.0 in | Wt 299.0 lb

## 2020-07-19 DIAGNOSIS — Z6838 Body mass index (BMI) 38.0-38.9, adult: Secondary | ICD-10-CM | POA: Diagnosis not present

## 2020-07-19 DIAGNOSIS — N138 Other obstructive and reflux uropathy: Secondary | ICD-10-CM

## 2020-07-19 DIAGNOSIS — E782 Mixed hyperlipidemia: Secondary | ICD-10-CM

## 2020-07-19 DIAGNOSIS — G629 Polyneuropathy, unspecified: Secondary | ICD-10-CM

## 2020-07-19 DIAGNOSIS — E1142 Type 2 diabetes mellitus with diabetic polyneuropathy: Secondary | ICD-10-CM

## 2020-07-19 DIAGNOSIS — I10 Essential (primary) hypertension: Secondary | ICD-10-CM | POA: Diagnosis not present

## 2020-07-19 DIAGNOSIS — N401 Enlarged prostate with lower urinary tract symptoms: Secondary | ICD-10-CM | POA: Diagnosis not present

## 2020-07-19 DIAGNOSIS — K2100 Gastro-esophageal reflux disease with esophagitis, without bleeding: Secondary | ICD-10-CM | POA: Diagnosis not present

## 2020-07-19 MED ORDER — CELECOXIB 200 MG PO CAPS: 200.0000 mg | ORAL_CAPSULE | Freq: Two times a day (BID) | ORAL | 6 refills | Status: DC

## 2020-07-19 MED ORDER — ONETOUCH ULTRA VI STRP: ORAL_STRIP | 12 refills | Status: AC

## 2020-07-19 NOTE — Progress Notes (Signed)
Established Patient Office Visit  Subjective:  Patient ID: Brad Johnson, male    DOB: 07-24-1960  Age: 60 y.o. MRN: [id number]  CC:  Chief Complaint  Patient presents with   Hypertension    HPI Brad Johnson presents for chronic visit  Patient presents for follow up of hypertension.  Patient tolerating lisinopril well with side effects.  Patient was diagnosed with hypertension 20`0 so has been treated for hypertension for 10 years.Patient is working on maintaining diet and exercise regimen and follows up as directed. Complication include none.   Patient present with type 2 diabetes.  Specifically, this is type 2, noninsulin requiring diabetes, complicated by polyneuropathy.  Compliance with treatment has been good; patient take medicines as directed, maintains diet and exercise regimen, follows up as directed, and is keeping glucose diary.  Date of  diagnosis 2010.  Depression screen has been performed.Tobacco screen nonsmoker. Current medicines for diabetes jardiance.  Patient is on lisinopril for renal protection and crestor for cholesterol control.  Patient performs foot exams daily and last ophthalmologic exam was no.   Past Medical History:  Diagnosis Date   Arthritis    Depression    Hyperlipidemia    Prostate enlargement     Past Surgical History:  Procedure Laterality Date   CYSTOSCOPY WITH INSERTION OF UROLIFT     FRACTURE SURGERY     SPINE SURGERY     TOTAL HIP ARTHROPLASTY      History reviewed. No pertinent family history.  Social History   Socioeconomic History   Marital status: Widowed    Spouse name: Not on file   Number of children: Not on file   Years of education: Not on file   Highest education level: Not on file  Occupational History   Not on file  Tobacco Use   Smoking status: Never   Smokeless tobacco: Never  Vaping Use   Vaping Use: Never used  Substance and Sexual Activity   Alcohol use: Never   Drug use: Never   Sexual activity:  Not on file  Other Topics Concern   Not on file  Social History Narrative   Not on file   Social Determinants of Health   Financial Resource Strain: Not on file  Food Insecurity: No Food Insecurity   Worried About Running Out of Food in the Last Year: Never true   [place] in the Last Year: Never true  Transportation Needs: No Transportation Needs   Lack of Transportation (Medical): No   Lack of Transportation (Non-Medical): No  Physical Activity: Not on file  Stress: Not on file  Social Connections: Not on file  Intimate Partner Violence: Not on file    Outpatient Medications Prior to Visit  Medication Sig Dispense Refill   levofloxacin (LEVAQUIN) 500 MG tablet Take 500 mg by mouth daily as needed.     ALPRAZolam (XANAX) 0.5 MG tablet Take 1 tablet (0.5 mg total) by mouth daily as needed. 30 tablet 3   aspirin EC 81 MG tablet Take 81 mg by mouth daily.     bethanechol (URECHOLINE) 25 MG tablet Take 25 mg by mouth daily. (Patient not taking: Reported on 04/24/2020)     docusate sodium (COLACE) 100 MG capsule Take 100 mg by mouth 2 (two) times daily.     furosemide (LASIX) 20 MG tablet TAKE 1 TABLET(20 MG) BY MOUTH 1 TIME PER DAY (Patient taking differently: Take 20 mg by mouth daily as needed.) 30 tablet 6  gabapentin (NEURONTIN) 600 MG tablet TAKE 1 TABLET BY MOUTH THREE TIMES DAILY 270 tablet 2   JARDIANCE 10 MG TABS tablet TAKE 1 TABLET BY MOUTH DAILY 90 tablet 2   LINZESS 145 MCG CAPS capsule TAKE 1 CAPSULE BY MOUTH DAILY 30 MINUTES BEFORE THE FIRST MEAL OF THE DAY 90 capsule 2   lisinopril (ZESTRIL) 10 MG tablet TAKE 1 TABLET(10 MG) BY MOUTH DAILY 90 tablet 2   loratadine (CLARITIN) 10 MG tablet Take 10 mg by mouth daily as needed for allergies.     naloxone (NARCAN) 0.4 MG/ML injection Inject 1 mL (0.4 mg total) into the vein as needed. (Patient not taking: Reported on 04/24/2020) 1 mL 3   Omega-3 Fatty Acids (FISH OIL) 1000 MG CAPS Take 1 capsule by mouth daily.      omeprazole (PRILOSEC) 20 MG capsule TAKE ONE CAPSULE BY MOUTH DAILY 90 capsule 2   oxyCODONE (ROXICODONE) 15 MG immediate release tablet Take 15 mg by mouth every 4 (four) hours as needed.     rosuvastatin (CRESTOR) 10 MG tablet TAKE 1 TABLET(10 MG) BY MOUTH EVERY DAY 90 tablet 2   tamsulosin (FLOMAX) 0.4 MG CAPS capsule Take 0.4 mg by mouth daily. (Patient not taking: Reported on 04/24/2020)     celecoxib (CELEBREX) 200 MG capsule Take 1 capsule (200 mg total) by mouth daily. 30 capsule 6   levofloxacin (LEVAQUIN) 500 MG tablet Take 1 tablet (500 mg total) by mouth daily. Use with uti 3 tablet 4   No facility-administered medications prior to visit.    No Known Allergies  ROS Review of Systems  Constitutional:  Negative for chills, fatigue and fever.  HENT:  Negative for congestion, ear pain and sore throat.   Respiratory:  Negative for cough and shortness of breath.   Cardiovascular:  Negative for chest pain.  Gastrointestinal:  Negative for abdominal pain, constipation, diarrhea, nausea and vomiting.  Endocrine: Negative for polydipsia, polyphagia and polyuria.  Genitourinary:  Negative for dysuria and frequency.  Musculoskeletal:  Negative for arthralgias and myalgias.  Neurological:  Negative for dizziness and headaches.  Psychiatric/Behavioral:  Negative for dysphoric mood.        No dysphoria     Objective:    Physical Exam Vitals reviewed.  Constitutional:      General: He is not in acute distress.    Appearance: Normal appearance. He is obese.  HENT:     Head: Normocephalic and atraumatic.     Right Ear: Tympanic membrane, ear canal and external ear normal.     Left Ear: Tympanic membrane, ear canal and external ear normal.     Nose: Nose normal.     Mouth/Throat:     Mouth: Mucous membranes are moist.     Pharynx: Oropharynx is clear.  Eyes:     Extraocular Movements: Extraocular movements intact.     Conjunctiva/sclera: Conjunctivae normal.     Pupils: Pupils  are equal, round, and reactive to light.  Cardiovascular:     Rate and Rhythm: Normal rate and regular rhythm.     Pulses: Normal pulses.     Heart sounds: Normal heart sounds. No murmur heard.   No gallop.  Pulmonary:     Effort: Pulmonary effort is normal. No respiratory distress.     Breath sounds: Normal breath sounds. No wheezing.  Abdominal:     General: Abdomen is flat. Bowel sounds are normal. There is no distension.     Palpations: Abdomen is soft.  Tenderness: There is no abdominal tenderness.  Musculoskeletal:        General: Tenderness present. Normal range of motion.     Cervical back: Normal range of motion.  Skin:    General: Skin is warm and dry.     Capillary Refill: Capillary refill takes less than 2 seconds.  Neurological:     General: No focal deficit present.     Mental Status: He is alert and oriented to person, place, and time. Mental status is at baseline.     Sensory: Sensory deficit present.  Psychiatric:        Mood and Affect: Mood normal.    BP 120/76   Pulse (!) 54   Temp 97.7 F (36.5 C)   Resp 16   Ht 6' (1.829 m)   Wt 299 lb (135.6 kg)   BMI 40.55 kg/m  Wt Readings from Last 3 Encounters:  07/19/20 299 lb (135.6 kg)  03/21/20 296 lb (134.3 kg)  11/17/19 281 lb (127.5 kg)     Health Maintenance Due  Topic Date Due   PNEUMOCOCCAL POLYSACCHARIDE VACCINE AGE 14-64 HIGH RISK  Never done   COVID-19 Vaccine (1) Never done   Pneumococcal Vaccine 43-61 Years old (1 - PCV) Never done   OPHTHALMOLOGY EXAM  Never done   HIV Screening  Never done   Hepatitis C Screening  Never done   TETANUS/TDAP  Never done   Zoster Vaccines- Shingrix (1 of 2) Never done   COLONOSCOPY (Pts 45-63yr Insurance coverage will need to be confirmed)  Never done    There are no preventive care reminders to display for this patient.  No results found for: TSH Lab Results  Component Value Date   WBC 3.3 (L) 07/19/2020   HGB 15.3 07/19/2020   HCT 43.6  07/19/2020   MCV 91 07/19/2020   PLT 160 07/19/2020   Lab Results  Component Value Date   NA 139 07/19/2020   K 4.6 07/19/2020   CO2 24 07/19/2020   GLUCOSE 112 (H) 07/19/2020   BUN 14 07/19/2020   CREATININE 0.88 07/19/2020   BILITOT 0.6 07/19/2020   ALKPHOS 81 07/19/2020   AST 21 07/19/2020   ALT 20 07/19/2020   PROT 7.1 07/19/2020   ALBUMIN 4.8 07/19/2020   CALCIUM 9.6 07/19/2020   EGFR 98 07/19/2020   Lab Results  Component Value Date   CHOL 178 07/19/2020   Lab Results  Component Value Date   HDL 57 07/19/2020   Lab Results  Component Value Date   LDLCALC 102 (H) 07/19/2020   Lab Results  Component Value Date   TRIG 106 07/19/2020   Lab Results  Component Value Date   CHOLHDL 3.1 07/19/2020   Lab Results  Component Value Date   HGBA1C 6.5 (H) 07/19/2020      Assessment & Plan:   Diagnoses and all orders for this visit: Essential hypertension, benign -     CBC with Differential/Platelet -     Comprehensive metabolic panel An individual hypertension care plan was established and reinforced today.  The patient's status was assessed using clinical findings on exam and labs or diagnostic tests. The patient's success at meeting treatment goals on disease specific evidence-based guidelines and found to be well controlled. SELF MANAGEMENT: The patient and I together assessed ways to personally work towards obtaining the recommended goals. RECOMMENDATIONS: avoid decongestants found in common cold remedies, decrease consumption of alcohol, perform routine monitoring of BP with home BP cuff, exercise,  reduction of dietary salt, take medicines as prescribed, try not to miss doses and quit smoking.  Regular exercise and maintaining a healthy weight is needed.  Stress reduction may help. A CLINICAL SUMMARY including written plan identify barriers to care unique to individual due to social or financial issues.  We attempt to mutually creat solutions for individual and  family understanding.   Gastroesophageal reflux disease with esophagitis without hemorrhage Patient has gastroesophageal reflux symptoms with esophagitis and LTRD.  The symptoms are moderate intensity.  Length of symptoms 10 years.  Medicines include OTC.  Complications include none.   Diabetic polyneuropathy associated with type 2 diabetes mellitus (HCC) -     glucose blood (ONETOUCH ULTRA) test strip; Use as instructed -     Hemoglobin A1c An individual care plan for diabetes was established and reinforced today.  The patient's status was assessed using clinical findings on exam, labs and diagnostic testing. Patient success at meeting goals based on disease specific evidence-based guidelines and found to be fair controlled. Medications were assessed and patient's understanding of the medical issues , including barriers were assessed. Recommend adherence to a diabetic diet, a graduated exercise program, HgbA1c level is checked quarterly, and urine microalbumin performed yearly .  Annual mono-filament sensation testing performed. Lower blood pressure and control hyperlipidemia is important. Get annual eye exams and annual flu shots and smoking cessation discussed.  Self management goals were discussed.   Polyneuropathy AN INDIVIDUAL CARE PLAN for polyneuropathy was established and reinforced today.  The patient's status was assessed using clinical findings on exam, labs, and other diagnostic testing. Patient's success at meeting treatment goals based on disease specific evidence-bassed guidelines and found to be in good control. RECOMMENDATIONS include maintaining present medicines and treatment.   Enlarged prostate with urinary obstruction -     PSA AN INDIVIDUAL CARE PLAN for BPH was established and reinforced today.  The patient's status was assessed using clinical findings on exam, labs, and other diagnostic testing. Patient's success at meeting treatment goals based on disease specific  evidence-bassed guidelines and found to be in good control. RECOMMENDATIONS include maintaining present medicines and treatment.   Mixed hyperlipidemia -     Lipid panel AN INDIVIDUAL CARE PLAN for hyperlipidemia/ cholesterol was established and reinforced today.  The patient's status was assessed using clinical findings on exam, lab and other diagnostic tests. The patient's disease status was assessed based on evidence-based guidelines and found to be well controlled. MEDICATIONS were reviewed. SELF MANAGEMENT GOALS have been discussed and patient's success at attaining the goal of low cholesterol was assessed. RECOMMENDATION given include regular exercise 3 days a week and low cholesterol/low fat diet. CLINICAL SUMMARY including written plan to identify barriers unique to the patient due to social or economic  reasons was discussed.   BMI 38.0-38.9,adult An individualize plan was formulated for obesity using patient history and physical exam to encourage weight loss.  An evidence based program was formulated.  Patient is to cut portion size with meals and to plan physical exercise 3 days a week at least 20 minutes.  Weight watchers and other programs are helpful.  Planned amount of weight loss 10 lbs. With hypertension and diabetes, he meets the criteria for morbid obesity  Morbid obesity We will work on weight loss and livestyle changes Other orders -     celecoxib (CELEBREX) 200 MG capsule; Take 1 capsule (200 mg total) by mouth 2 (two) times daily. -     Cardiovascular Risk Assessment  Visit 30 minutes visit  Follow-up: Return in about 4 months (around 11/18/2020) for fasting.    Reinaldo Meeker, MD

## 2020-07-20 LAB — HEMOGLOBIN A1C
Est. average glucose Bld gHb Est-mCnc: 140 mg/dL
Hgb A1c MFr Bld: 6.5 % — ABNORMAL HIGH (ref 4.8–5.6)

## 2020-07-20 LAB — CBC WITH DIFFERENTIAL/PLATELET
Basophils Absolute: 0.1 10*3/uL (ref 0.0–0.2)
Basos: 2 %
EOS (ABSOLUTE): 0.1 10*3/uL (ref 0.0–0.4)
Eos: 4 %
Hematocrit: 43.6 % (ref 37.5–51.0)
Hemoglobin: 15.3 g/dL (ref 13.0–17.7)
Immature Grans (Abs): 0 10*3/uL (ref 0.0–0.1)
Immature Granulocytes: 0 %
Lymphocytes Absolute: 1.7 10*3/uL (ref 0.7–3.1)
Lymphs: 52 %
MCH: 31.9 pg (ref 26.6–33.0)
MCHC: 35.1 g/dL (ref 31.5–35.7)
MCV: 91 fL (ref 79–97)
Monocytes Absolute: 0.4 10*3/uL (ref 0.1–0.9)
Monocytes: 12 %
Neutrophils Absolute: 1 10*3/uL — ABNORMAL LOW (ref 1.4–7.0)
Neutrophils: 30 %
Platelets: 160 10*3/uL (ref 150–450)
RBC: 4.8 x10E6/uL (ref 4.14–5.80)
RDW: 12.8 % (ref 11.6–15.4)
WBC: 3.3 10*3/uL — ABNORMAL LOW (ref 3.4–10.8)

## 2020-07-20 LAB — COMPREHENSIVE METABOLIC PANEL
ALT: 20 IU/L (ref 0–44)
AST: 21 IU/L (ref 0–40)
Albumin/Globulin Ratio: 2.1 (ref 1.2–2.2)
Albumin: 4.8 g/dL (ref 3.8–4.9)
Alkaline Phosphatase: 81 IU/L (ref 44–121)
BUN/Creatinine Ratio: 16 (ref 10–24)
BUN: 14 mg/dL (ref 8–27)
Bilirubin Total: 0.6 mg/dL (ref 0.0–1.2)
CO2: 24 mmol/L (ref 20–29)
Calcium: 9.6 mg/dL (ref 8.6–10.2)
Chloride: 99 mmol/L (ref 96–106)
Creatinine, Ser: 0.88 mg/dL (ref 0.76–1.27)
Globulin, Total: 2.3 g/dL (ref 1.5–4.5)
Glucose: 112 mg/dL — ABNORMAL HIGH (ref 65–99)
Potassium: 4.6 mmol/L (ref 3.5–5.2)
Sodium: 139 mmol/L (ref 134–144)
Total Protein: 7.1 g/dL (ref 6.0–8.5)
eGFR: 98 mL/min/{1.73_m2} (ref >59)

## 2020-07-20 LAB — LIPID PANEL
Chol/HDL Ratio: 3.1 ratio (ref 0.0–5.0)
Cholesterol, Total: 178 mg/dL (ref 100–199)
HDL: 57 mg/dL (ref >39)
LDL Chol Calc (NIH): 102 mg/dL — ABNORMAL HIGH (ref 0–99)
Triglycerides: 106 mg/dL (ref 0–149)
VLDL Cholesterol Cal: 19 mg/dL (ref 5–40)

## 2020-07-20 LAB — PSA: Prostate Specific Ag, Serum: 0.2 ng/mL (ref 0.0–4.0)

## 2020-07-20 LAB — CARDIOVASCULAR RISK ASSESSMENT

## 2020-07-21 NOTE — Progress Notes (Signed)
Cbc normal, low wbc is chronic, Glucose 112, kidney and liver tests normal, A1c 6.5 good, LDL cholesterol 02, PSA 0.2 normal lp

## 2020-07-25 DIAGNOSIS — Z96642 Presence of left artificial hip joint: Secondary | ICD-10-CM | POA: Diagnosis not present

## 2020-07-25 DIAGNOSIS — M7062 Trochanteric bursitis, left hip: Secondary | ICD-10-CM | POA: Diagnosis not present

## 2020-07-26 ENCOUNTER — Other Ambulatory Visit: Payer: Self-pay | Admitting: Legal Medicine

## 2020-08-08 ENCOUNTER — Telehealth: Payer: Self-pay

## 2020-08-08 NOTE — Progress Notes (Signed)
Chronic Care Management Pharmacy Assistant   Name: Brad Johnson  MRN: [id number] DOB: May 12, 1960   Reason for Encounter: Disease State for lipids  Recent office visits:  07/19/20-Dr Henrene Pastor PCP, hypertension, labs ordered, Celebrex changed to 200mg  BID,  Follow up 57mos.  Lab results: Cbc normal, low wbc is chronic, Glucose 112, kidney and liver tests normal, A1c6.5 good, LDL cholesterol 02, PSA 0.2 normal, Lp  Recent consult visits:  none  Hospital visits:  None in previous 6 months  Medications: Outpatient Encounter Medications as of 08/08/2020  Medication Sig   ALPRAZolam (XANAX) 0.5 MG tablet Take 1 tablet (0.5 mg total) by mouth daily as needed.   aspirin EC 81 MG tablet Take 81 mg by mouth daily.   bethanechol (URECHOLINE) 25 MG tablet Take 25 mg by mouth daily. (Patient not taking: Reported on 04/24/2020)   celecoxib (CELEBREX) 200 MG capsule Take 1 capsule (200 mg total) by mouth 2 (two) times daily.   docusate sodium (COLACE) 100 MG capsule Take 100 mg by mouth 2 (two) times daily.   furosemide (LASIX) 20 MG tablet TAKE 1 TABLET(20 MG) BY MOUTH 1 TIME PER DAY (Patient taking differently: Take 20 mg by mouth daily as needed.)   gabapentin (NEURONTIN) 600 MG tablet TAKE 1 TABLET BY MOUTH THREE TIMES DAILY   glucose blood (ONETOUCH ULTRA) test strip Use as instructed   JARDIANCE 10 MG TABS tablet TAKE 1 TABLET BY MOUTH DAILY   levofloxacin (LEVAQUIN) 500 MG tablet TAKE 1 TABLET BY MOUTH DAILY. USE WITH UTI   LINZESS 145 MCG CAPS capsule TAKE 1 CAPSULE BY MOUTH DAILY 30 MINUTES BEFORE THE FIRST MEAL OF THE DAY   lisinopril (ZESTRIL) 10 MG tablet TAKE 1 TABLET(10 MG) BY MOUTH DAILY   loratadine (CLARITIN) 10 MG tablet Take 10 mg by mouth daily as needed for allergies.   naloxone (NARCAN) 0.4 MG/ML injection Inject 1 mL (0.4 mg total) into the vein as needed. (Patient not taking: Reported on 04/24/2020)   Omega-3 Fatty Acids (FISH OIL) 1000 MG CAPS Take 1 capsule by mouth daily.    omeprazole (PRILOSEC) 20 MG capsule TAKE ONE CAPSULE BY MOUTH DAILY   oxyCODONE (ROXICODONE) 15 MG immediate release tablet Take 15 mg by mouth every 4 (four) hours as needed.   rosuvastatin (CRESTOR) 10 MG tablet TAKE 1 TABLET(10 MG) BY MOUTH EVERY DAY   tamsulosin (FLOMAX) 0.4 MG CAPS capsule Take 0.4 mg by mouth daily. (Patient not taking: Reported on 04/24/2020)   No facility-administered encounter medications on file as of 08/08/2020.   Lipid Panel    Component Value Date/Time   CHOL 178 07/19/2020 0759   TRIG 106 07/19/2020 0759   HDL 57 07/19/2020 0759   LDLCALC 102 (H) 07/19/2020 0759    10-year ASCVD risk score: The 10-year ASCVD risk score Mikey Bussing DC Brooke Bonito., et al., 2013) is: 13.7%   Values used to calculate the score:     Age: 60 years     Sex: Male     Is Non-Hispanic African American: No     Diabetic: Yes     Tobacco smoker: No     Systolic Blood Pressure: 622 mmHg     Is BP treated: Yes     HDL Cholesterol: 57 mg/dL     Total Cholesterol: 178 mg/dL  Current antihyperlipidemic regimen:  rosuvastatin 10 mg daily - recommend increase in dose to 20 mg daily(at last office visit)  Omega 3 fish oil daily  ASCVD  risk enhancing conditions: DM  What recent interventions/DTPs have been made by any provider to improve Cholesterol control since last CPP Visit: Patient is taking his medication as directed, he denies any current issues or side effects with his medications.   What diet changes have been made to improve Cholesterol?  Patient stated he watches his diet  What exercise is being done to improve Cholesterol?  Patient stated he stays very active  Adherence Review: Does the patient have >5 day gap between last estimated fill dates?   Star Rating Drugs   Patient has some left from last fill  Medication Name Last Fill Days supply Furosemide  05/15/20 90 Lisinopril  05/15/20 90 Rosuvastatin  07/14/20 90  Care Gaps: Last annual wellness visit? None listed Foot Exam:  03/21/20  Clarita Leber, [place] Pharmacist Assistant [phone]

## 2020-08-13 ENCOUNTER — Other Ambulatory Visit: Payer: Self-pay | Admitting: Legal Medicine

## 2020-08-13 DIAGNOSIS — I10 Essential (primary) hypertension: Secondary | ICD-10-CM

## 2020-08-13 DIAGNOSIS — G629 Polyneuropathy, unspecified: Secondary | ICD-10-CM

## 2020-08-19 ENCOUNTER — Other Ambulatory Visit: Payer: Self-pay | Admitting: Legal Medicine

## 2020-08-19 DIAGNOSIS — E782 Mixed hyperlipidemia: Secondary | ICD-10-CM

## 2020-08-19 MED ORDER — ROSUVASTATIN CALCIUM 20 MG PO TABS: ORAL_TABLET | ORAL | 2 refills | Status: DC

## 2020-08-19 NOTE — Telephone Encounter (Cosign Needed)
  Chronic Care Management   Note  08/19/2020 Name: Brad Johnson MRN: [id number] DOB: [birth year]  Pharmacist reviewed recent lipid panel. Recommend increasing Crestor 20 mg daily. Routing note to Dr. Henrene Pastor to approve increase. Pharmacy assistant will coordinate increased dose with patient if Dr. Henrene Pastor agrees.   Sherre Poot, PharmD, [facility] Clinical Pharmacist [facility] [phone] (office) [phone] (mobile)

## 2020-09-03 ENCOUNTER — Other Ambulatory Visit: Payer: Self-pay

## 2020-09-03 DIAGNOSIS — N138 Other obstructive and reflux uropathy: Secondary | ICD-10-CM

## 2020-09-04 ENCOUNTER — Ambulatory Visit (INDEPENDENT_AMBULATORY_CARE_PROVIDER_SITE_OTHER): Payer: PPO | Admitting: Legal Medicine

## 2020-09-04 ENCOUNTER — Encounter: Payer: Self-pay | Admitting: Legal Medicine

## 2020-09-04 ENCOUNTER — Other Ambulatory Visit: Payer: Self-pay

## 2020-09-04 VITALS — BP 124/80 | HR 64 | Temp 97.3°F | Ht 71.0 in | Wt 303.0 lb

## 2020-09-04 DIAGNOSIS — N410 Acute prostatitis: Secondary | ICD-10-CM

## 2020-09-04 LAB — POCT URINALYSIS DIP (CLINITEK)
Bilirubin, UA: NEGATIVE
Blood, UA: NEGATIVE
Glucose, UA: >=1000 mg/dL — AB
Ketones, POC UA: NEGATIVE mg/dL
Leukocytes, UA: NEGATIVE
Nitrite, UA: NEGATIVE
POC PROTEIN,UA: NEGATIVE
Spec Grav, UA: 1.025 (ref 1.010–1.025)
Urobilinogen, UA: 0.2 E.U./dL
pH, UA: 6 (ref 5.0–8.0)

## 2020-09-04 MED ORDER — LEVOFLOXACIN 500 MG PO TABS
500.0000 mg | ORAL_TABLET | Freq: Every day | ORAL | 0 refills | Status: DC
Start: 2020-09-04 — End: 2020-12-10

## 2020-09-04 NOTE — Progress Notes (Signed)
Established Patient Office Visit  Subjective:  Patient ID: Brad Johnson, male    DOB: September 01, 1960  Age: 60 y.o. MRN: [id number]  CC:  Chief Complaint  Patient presents with   Dysuria    HPI Brad Johnson presents for prostatitis. He had urolift installed.  Dr. Comer Locket did circumcision and urolift.  He gets intermittent irritation and infection and using levofloxacin for a few days then it gets better. Urology felt it represented a localized infection and treated with 3 days of levaquin  Past Medical History:  Diagnosis Date   Arthritis    Depression    Hyperlipidemia    Prostate enlargement     Past Surgical History:  Procedure Laterality Date   CYSTOSCOPY WITH INSERTION OF UROLIFT     FRACTURE SURGERY     SPINE SURGERY     TOTAL HIP ARTHROPLASTY      History reviewed. No pertinent family history.  Social History   Socioeconomic History   Marital status: Widowed    Spouse name: Not on file   Number of children: Not on file   Years of education: Not on file   Highest education level: Not on file  Occupational History   Not on file  Tobacco Use   Smoking status: Never   Smokeless tobacco: Never  Vaping Use   Vaping Use: Never used  Substance and Sexual Activity   Alcohol use: Never   Drug use: Never   Sexual activity: Not on file  Other Topics Concern   Not on file  Social History Narrative   Not on file   Social Determinants of Health   Financial Resource Strain: Not on file  Food Insecurity: No Food Insecurity   Worried About Running Out of Food in the Last Year: Never true   Craig in the Last Year: Never true  Transportation Needs: No Transportation Needs   Lack of Transportation (Medical): No   Lack of Transportation (Non-Medical): No  Physical Activity: Not on file  Stress: Not on file  Social Connections: Not on file  Intimate Partner Violence: Not on file    Outpatient Medications Prior to Visit  Medication Sig Dispense  Refill   ALPRAZolam (XANAX) 0.5 MG tablet Take 1 tablet (0.5 mg total) by mouth daily as needed. 30 tablet 3   aspirin EC 81 MG tablet Take 81 mg by mouth daily.     bethanechol (URECHOLINE) 25 MG tablet Take 25 mg by mouth daily.     celecoxib (CELEBREX) 200 MG capsule Take 1 capsule (200 mg total) by mouth 2 (two) times daily. 60 capsule 6   docusate sodium (COLACE) 100 MG capsule Take 100 mg by mouth 2 (two) times daily.     furosemide (LASIX) 20 MG tablet TAKE 1 TABLET(20 MG) BY MOUTH EVERY DAY 30 tablet 6   furosemide (LASIX) 20 MG tablet TAKE 1 TABLET(20 MG) BY MOUTH EVERY DAY 30 tablet 6   gabapentin (NEURONTIN) 600 MG tablet TAKE 1 TABLET BY MOUTH THREE TIMES DAILY 270 tablet 2   glucose blood (ONETOUCH ULTRA) test strip Use as instructed 100 each 12   JARDIANCE 10 MG TABS tablet TAKE 1 TABLET BY MOUTH DAILY 90 tablet 2   levofloxacin (LEVAQUIN) 500 MG tablet TAKE 1 TABLET BY MOUTH DAILY. USE WITH UTI 7 tablet 0   LINZESS 145 MCG CAPS capsule TAKE 1 CAPSULE BY MOUTH DAILY 30 MINUTES BEFORE THE FIRST MEAL OF THE DAY 90 capsule 2  lisinopril (ZESTRIL) 10 MG tablet TAKE 1 TABLET(10 MG) BY MOUTH DAILY 90 tablet 2   loratadine (CLARITIN) 10 MG tablet Take 10 mg by mouth daily as needed for allergies.     naloxone (NARCAN) 0.4 MG/ML injection Inject 1 mL (0.4 mg total) into the vein as needed. 1 mL 3   Omega-3 Fatty Acids (FISH OIL) 1000 MG CAPS Take 1 capsule by mouth daily.     omeprazole (PRILOSEC) 20 MG capsule TAKE ONE CAPSULE BY MOUTH DAILY 90 capsule 2   oxyCODONE (ROXICODONE) 15 MG immediate release tablet Take 15 mg by mouth every 4 (four) hours as needed.     rosuvastatin (CRESTOR) 20 MG tablet TAKE 1 TABLET(10 MG) BY MOUTH EVERY DAY 90 tablet 2   tamsulosin (FLOMAX) 0.4 MG CAPS capsule Take 0.4 mg by mouth daily.     No facility-administered medications prior to visit.    No Known Allergies  ROS Review of Systems  Constitutional:  Negative for chills, fatigue and fever.   HENT:  Negative for congestion, ear pain and sore throat.   Respiratory:  Negative for cough and shortness of breath.   Cardiovascular:  Negative for chest pain.  Gastrointestinal:  Negative for abdominal pain, constipation, diarrhea, nausea and vomiting.  Endocrine: Negative for polydipsia, polyphagia and polyuria.  Genitourinary:  Negative for dysuria and frequency.  Musculoskeletal:  Negative for arthralgias and myalgias.  Neurological:  Negative for dizziness and headaches.  Psychiatric/Behavioral:  Negative for dysphoric mood.        No dysphoria     Objective:    Physical Exam Vitals reviewed.  Constitutional:      Appearance: Normal appearance.  HENT:     Right Ear: Tympanic membrane normal.     Left Ear: Tympanic membrane normal.  Cardiovascular:     Rate and Rhythm: Normal rate and regular rhythm.     Pulses: Normal pulses.     Heart sounds: Normal heart sounds. No murmur heard.   No gallop.  Pulmonary:     Effort: Pulmonary effort is normal. No respiratory distress.     Breath sounds: Normal breath sounds. No wheezing.  Neurological:     Mental Status: He is alert.    BP 124/80   Pulse 64   Temp (!) 97.3 F (36.3 C)   Ht _0  (1.803 m)   Wt (!) 303 lb (137.4 kg)   SpO2 97%   BMI 42.26 kg/m  Wt Readings from Last 3 Encounters:  09/04/20 (!) 303 lb (137.4 kg)  07/19/20 299 lb (135.6 kg)  03/21/20 296 lb (134.3 kg)     Health Maintenance Due  Topic Date Due   PNEUMOCOCCAL POLYSACCHARIDE VACCINE AGE 72-64 HIGH RISK  Never done   COVID-19 Vaccine (1) Never done   Pneumococcal Vaccine 37-32 Years old (1 - PCV) Never done   OPHTHALMOLOGY EXAM  Never done   HIV Screening  Never done   Hepatitis C Screening  Never done   TETANUS/TDAP  Never done   Zoster Vaccines- Shingrix (1 of 2) Never done   COLONOSCOPY (Pts 45-58yr Insurance coverage will need to be confirmed)  Never done   INFLUENZA VACCINE  09/02/2020    There are no preventive care reminders  to display for this patient.  No results found for: TSH Lab Results  Component Value Date   WBC 3.3 (L) 07/19/2020   HGB 15.3 07/19/2020   HCT 43.6 07/19/2020   MCV 91 07/19/2020   PLT 160 07/19/2020  Lab Results  Component Value Date   NA 139 07/19/2020   K 4.6 07/19/2020   CO2 24 07/19/2020   GLUCOSE 112 (H) 07/19/2020   BUN 14 07/19/2020   CREATININE 0.88 07/19/2020   BILITOT 0.6 07/19/2020   ALKPHOS 81 07/19/2020   AST 21 07/19/2020   ALT 20 07/19/2020   PROT 7.1 07/19/2020   ALBUMIN 4.8 07/19/2020   CALCIUM 9.6 07/19/2020   EGFR 98 07/19/2020   Lab Results  Component Value Date   CHOL 178 07/19/2020   Lab Results  Component Value Date   HDL 57 07/19/2020   Lab Results  Component Value Date   LDLCALC 102 (H) 07/19/2020   Lab Results  Component Value Date   TRIG 106 07/19/2020   Lab Results  Component Value Date   CHOLHDL 3.1 07/19/2020   Lab Results  Component Value Date   HGBA1C 6.5 (H) 07/19/2020      Assessment & Plan:   Problem List Items Addressed This Visit   None Visit Diagnoses     Acute prostatitis    -  Primary   Relevant Orders   POCT URINALYSIS DIP (CLINITEK)  Patient gets prostatitis symptoms when he has problems with urolift.  May be due. To localized infection.  Levaquin is successful in treating problem       Follow-up: Return if symptoms worsen or fail to improve.    Reinaldo Meeker, MD

## 2020-10-10 ENCOUNTER — Other Ambulatory Visit: Payer: Self-pay | Admitting: Legal Medicine

## 2020-10-12 ENCOUNTER — Other Ambulatory Visit: Payer: Self-pay | Admitting: Legal Medicine

## 2020-10-21 DIAGNOSIS — M85611 Other cyst of bone, right shoulder: Secondary | ICD-10-CM | POA: Diagnosis not present

## 2020-10-25 DIAGNOSIS — L720 Epidermal cyst: Secondary | ICD-10-CM | POA: Diagnosis not present

## 2020-11-04 DIAGNOSIS — E119 Type 2 diabetes mellitus without complications: Secondary | ICD-10-CM | POA: Diagnosis not present

## 2020-11-07 ENCOUNTER — Telehealth: Payer: Self-pay

## 2020-11-07 NOTE — Chronic Care Management (AMB) (Signed)
Chronic Care Management Pharmacy Assistant   Name: Brad Johnson  MRN: [id number] DOB: 1960-12-15   Reason for Encounter: Disease State call for DM    Recent office visits:  09/04/20 Reinaldo Meeker MD. Seen for acute prostatitis. Started on Levofloxacin 500 mg daily for 5 days. Follow up if symptoms worsen.  08/19/20 Orders Only. Reinaldo Meeker MD. Increased Rosuvastatin from 10 mg to 20 mg daily.  Recent consult visits:  None since 08/08/20  Hospital visits:  None since 08/08/20  Medications: Outpatient Encounter Medications as of 11/07/2020  Medication Sig   ALPRAZolam (XANAX) 0.5 MG tablet Take 1 tablet (0.5 mg total) by mouth daily as needed.   aspirin EC 81 MG tablet Take 81 mg by mouth daily.   bethanechol (URECHOLINE) 25 MG tablet Take 25 mg by mouth daily.   celecoxib (CELEBREX) 200 MG capsule Take 1 capsule (200 mg total) by mouth 2 (two) times daily.   docusate sodium (COLACE) 100 MG capsule Take 100 mg by mouth 2 (two) times daily.   furosemide (LASIX) 20 MG tablet TAKE 1 TABLET(20 MG) BY MOUTH EVERY DAY   furosemide (LASIX) 20 MG tablet TAKE 1 TABLET(20 MG) BY MOUTH EVERY DAY   gabapentin (NEURONTIN) 600 MG tablet TAKE 1 TABLET BY MOUTH THREE TIMES DAILY   glucose blood (ONETOUCH ULTRA) test strip Use as instructed   JARDIANCE 10 MG TABS tablet TAKE 1 TABLET BY MOUTH DAILY   levofloxacin (LEVAQUIN) 500 MG tablet TAKE 1 TABLET BY MOUTH DAILY. USE WITH UTI   levofloxacin (LEVAQUIN) 500 MG tablet Take 1 tablet (500 mg total) by mouth daily. Use once a day for 5 days with urolift infection   LINZESS 145 MCG CAPS capsule TAKE 1 CAPSULE BY MOUTH DAILY 30 MINUTES BEFORE THE FIRST MEAL OF THE DAY   lisinopril (ZESTRIL) 10 MG tablet TAKE 1 TABLET(10 MG) BY MOUTH DAILY   loratadine (CLARITIN) 10 MG tablet Take 10 mg by mouth daily as needed for allergies.   naloxone (NARCAN) 0.4 MG/ML injection Inject 1 mL (0.4 mg total) into the vein as needed.   Omega-3 Fatty Acids (FISH  OIL) 1000 MG CAPS Take 1 capsule by mouth daily.   omeprazole (PRILOSEC) 20 MG capsule TAKE 1 CAPSULE BY MOUTH DAILY   oxyCODONE (ROXICODONE) 15 MG immediate release tablet Take 15 mg by mouth every 4 (four) hours as needed.   rosuvastatin (CRESTOR) 10 MG tablet TAKE 1 TABLET(10 MG) BY MOUTH EVERY DAY   rosuvastatin (CRESTOR) 20 MG tablet TAKE 1 TABLET(10 MG) BY MOUTH EVERY DAY   tamsulosin (FLOMAX) 0.4 MG CAPS capsule Take 0.4 mg by mouth daily.   No facility-administered encounter medications on file as of 11/07/2020.    Recent Relevant Labs: Lab Results  Component Value Date/Time   HGBA1C 6.5 (H) 07/19/2020 07:59 AM   HGBA1C 6.3 (H) 03/21/2020 07:53 AM    Kidney Function Lab Results  Component Value Date/Time   CREATININE 0.88 07/19/2020 07:59 AM   CREATININE 0.81 03/21/2020 07:53 AM   GFRNONAA 97 03/21/2020 07:53 AM   GFRAA 112 03/21/2020 07:53 AM     Current antihyperglycemic regimen:  Jardiance 10 mg daily  Patient verbally confirms he is taking the above medications as directed. Yes  What recent interventions/DTPs have been made to improve glycemic control:  Pt stated no changes but did mention that Dr.Perry increased his Rosuvastatin to 20 mg and it has made him feel very sluggish. He stopped it a few days and  immediately felt better. He wants to know what to do so I sent a message to Dr. Henrene Pastor for him to advise.   Have there been any recent hospitalizations or ED visits since last visit with CPP? No recent visits  Patient denies hypoglycemic symptoms, including None  Patient denies hyperglycemic symptoms, including none  How often are you checking your blood sugar? in the morning before eating or drinking  11/07/20 107   On insulin? No  During the week, how often does your blood glucose drop below 70? Never  Are you checking your feet daily/regularly? Yes  Adherence Review: Is the patient currently on a STATIN medication? Yes Is the patient currently on  ACE/ARB medication? Yes Does the patient have >5 day gap between last estimated fill dates? Yes  Care Gaps: Last eye exam / Retinopathy Screening?  Done on 11/04/20 Last Annual Wellness Visit? None noted  Last Diabetic Foot Exam? Done on 07/19/20    Star Rating Drugs:  Medication:  Last Fill: Day Supply Rosuvastatin   10/12/20  90ds Jardiance  10/11/20  90ds Lisinopril   08/13/20 Cannon, [place] Pharmacist Assistant  [phone]

## 2020-11-11 DIAGNOSIS — M5416 Radiculopathy, lumbar region: Secondary | ICD-10-CM | POA: Diagnosis not present

## 2020-11-11 DIAGNOSIS — Z79891 Long term (current) use of opiate analgesic: Secondary | ICD-10-CM | POA: Diagnosis not present

## 2020-11-11 DIAGNOSIS — Z5181 Encounter for therapeutic drug level monitoring: Secondary | ICD-10-CM | POA: Diagnosis not present

## 2020-11-11 DIAGNOSIS — G894 Chronic pain syndrome: Secondary | ICD-10-CM | POA: Diagnosis not present

## 2020-11-11 DIAGNOSIS — M5459 Other low back pain: Secondary | ICD-10-CM | POA: Diagnosis not present

## 2020-11-18 ENCOUNTER — Ambulatory Visit: Payer: PPO | Admitting: Legal Medicine

## 2020-12-10 ENCOUNTER — Encounter: Payer: Self-pay | Admitting: Legal Medicine

## 2020-12-10 ENCOUNTER — Other Ambulatory Visit: Payer: Self-pay

## 2020-12-10 ENCOUNTER — Ambulatory Visit (INDEPENDENT_AMBULATORY_CARE_PROVIDER_SITE_OTHER): Payer: PPO | Admitting: Legal Medicine

## 2020-12-10 VITALS — BP 128/70 | HR 60 | Temp 97.5°F | Resp 16 | Ht 72.0 in | Wt 315.0 lb

## 2020-12-10 DIAGNOSIS — R5383 Other fatigue: Secondary | ICD-10-CM | POA: Diagnosis not present

## 2020-12-10 DIAGNOSIS — Z79891 Long term (current) use of opiate analgesic: Secondary | ICD-10-CM | POA: Diagnosis not present

## 2020-12-10 DIAGNOSIS — E1142 Type 2 diabetes mellitus with diabetic polyneuropathy: Secondary | ICD-10-CM | POA: Diagnosis not present

## 2020-12-10 DIAGNOSIS — Z6841 Body Mass Index (BMI) 40.0 and over, adult: Secondary | ICD-10-CM | POA: Diagnosis not present

## 2020-12-10 DIAGNOSIS — Z23 Encounter for immunization: Secondary | ICD-10-CM | POA: Diagnosis not present

## 2020-12-10 DIAGNOSIS — N400 Enlarged prostate without lower urinary tract symptoms: Secondary | ICD-10-CM | POA: Diagnosis not present

## 2020-12-10 DIAGNOSIS — Z6838 Body mass index (BMI) 38.0-38.9, adult: Secondary | ICD-10-CM

## 2020-12-10 DIAGNOSIS — I10 Essential (primary) hypertension: Secondary | ICD-10-CM

## 2020-12-10 DIAGNOSIS — S0990XS Unspecified injury of head, sequela: Secondary | ICD-10-CM

## 2020-12-10 DIAGNOSIS — E782 Mixed hyperlipidemia: Secondary | ICD-10-CM | POA: Diagnosis not present

## 2020-12-10 NOTE — Progress Notes (Signed)
Established Patient Office Visit  Subjective:  Patient ID: Brad Johnson, male    DOB: [birth year]  Age: 60 y.o. MRN: [id number]  CC:  Chief Complaint  Patient presents with   Hyperlipidemia   Hypertension    HPI Brad Johnson presents for chronic visit.  Patient presents for follow up of hypertension.  Patient tolerating lisinopril well with side effects.  Patient was diagnosed with hypertension 2010 so has been treated for hypertension for 12 years.Patient is working on maintaining diet and exercise regimen and follows up as directed. Complication include mone.   Patient presents with hyperlipidemia.  Compliance with treatment has been good; patient takes medicines as directed, maintains low cholesterol diet, follows up as directed, and maintains exercise regimen.  Patient is using jardiance without problems.   Patient present with type 2 diabetes.  Specifically, this is type 2, noninsulin requiring diabetes, complicated by polyneuropathy.  Compliance with treatment has been good; patient take medicines as directed, maintains diet and exercise regimen, follows up as directed, and is keeping glucose diary.  Date of  diagnosis 2010.  Depression screen has been performed.Tobacco screen nonsmoker. Current medicines for diabetes januvia.  Patient is on lisinopril for renal protection and cresto for cholesterol control.  Patient performs foot exams daily and last ophthalmologic exam was he had eye exam.   Chronic head injury.closed head injury in past.  Past Medical History:  Diagnosis Date   Arthritis    Depression    Hyperlipidemia    Prostate enlargement     Past Surgical History:  Procedure Laterality Date   CYSTOSCOPY WITH INSERTION OF UROLIFT     FRACTURE SURGERY     SPINE SURGERY     TOTAL HIP ARTHROPLASTY      History reviewed. No pertinent family history.  Social History   Socioeconomic History   Marital status: Widowed    Spouse name: Not on file   Number of  children: Not on file   Years of education: Not on file   Highest education level: Not on file  Occupational History   Not on file  Tobacco Use   Smoking status: Never   Smokeless tobacco: Never  Vaping Use   Vaping Use: Never used  Substance and Sexual Activity   Alcohol use: Never   Drug use: Never   Sexual activity: Not on file  Other Topics Concern   Not on file  Social History Narrative   Not on file   Social Determinants of Health   Financial Resource Strain: Not on file  Food Insecurity: No Food Insecurity   Worried About Running Out of Food in the Last Year: Never true   [place] in the Last Year: Never true  Transportation Needs: No Transportation Needs   Lack of Transportation (Medical): No   Lack of Transportation (Non-Medical): No  Physical Activity: Not on file  Stress: Not on file  Social Connections: Not on file  Intimate Partner Violence: Not on file    Outpatient Medications Prior to Visit  Medication Sig Dispense Refill   ALPRAZolam (XANAX) 0.5 MG tablet Take 1 tablet (0.5 mg total) by mouth daily as needed. 30 tablet 3   aspirin EC 81 MG tablet Take 81 mg by mouth daily.     bethanechol (URECHOLINE) 25 MG tablet Take 25 mg by mouth daily.     celecoxib (CELEBREX) 200 MG capsule Take 1 capsule (200 mg total) by mouth 2 (two) times daily. 60 capsule 6  docusate sodium (COLACE) 100 MG capsule Take 100 mg by mouth 2 (two) times daily.     furosemide (LASIX) 20 MG tablet TAKE 1 TABLET(20 MG) BY MOUTH EVERY DAY 30 tablet 6   furosemide (LASIX) 20 MG tablet TAKE 1 TABLET(20 MG) BY MOUTH EVERY DAY 30 tablet 6   gabapentin (NEURONTIN) 600 MG tablet TAKE 1 TABLET BY MOUTH THREE TIMES DAILY 270 tablet 2   glucose blood (ONETOUCH ULTRA) test strip Use as instructed 100 each 12   JARDIANCE 10 MG TABS tablet TAKE 1 TABLET BY MOUTH DAILY 90 tablet 2   levofloxacin (LEVAQUIN) 500 MG tablet TAKE 1 TABLET BY MOUTH DAILY. USE WITH UTI 7 tablet 0   LINZESS 145  MCG CAPS capsule TAKE 1 CAPSULE BY MOUTH DAILY 30 MINUTES BEFORE THE FIRST MEAL OF THE DAY 90 capsule 2   lisinopril (ZESTRIL) 10 MG tablet TAKE 1 TABLET(10 MG) BY MOUTH DAILY 90 tablet 2   loratadine (CLARITIN) 10 MG tablet Take 10 mg by mouth daily as needed for allergies.     naloxone (NARCAN) 0.4 MG/ML injection Inject 1 mL (0.4 mg total) into the vein as needed. 1 mL 3   Omega-3 Fatty Acids (FISH OIL) 1000 MG CAPS Take 1 capsule by mouth daily.     omeprazole (PRILOSEC) 20 MG capsule TAKE 1 CAPSULE BY MOUTH DAILY 90 capsule 2   oxyCODONE (ROXICODONE) 15 MG immediate release tablet Take 15 mg by mouth every 4 (four) hours as needed.     rosuvastatin (CRESTOR) 10 MG tablet TAKE 1 TABLET(10 MG) BY MOUTH EVERY DAY 90 tablet 2   tamsulosin (FLOMAX) 0.4 MG CAPS capsule Take 0.4 mg by mouth daily.     levofloxacin (LEVAQUIN) 500 MG tablet Take 1 tablet (500 mg total) by mouth daily. Use once a day for 5 days with urolift infection 30 tablet 0   rosuvastatin (CRESTOR) 20 MG tablet TAKE 1 TABLET(10 MG) BY MOUTH EVERY DAY 90 tablet 2   No facility-administered medications prior to visit.    No Known Allergies  ROS Review of Systems  Constitutional:  Negative for activity change and appetite change.  HENT:  Negative for congestion.   Eyes:  Negative for visual disturbance.  Respiratory:  Negative for chest tightness and shortness of breath.   Cardiovascular:  Negative for chest pain, palpitations and leg swelling.  Gastrointestinal:  Negative for abdominal distention and abdominal pain.  Genitourinary:  Negative for difficulty urinating and frequency.  Musculoskeletal:  Negative for arthralgias and back pain.  Neurological:  Positive for weakness.  Psychiatric/Behavioral: Negative.       Objective:    Physical Exam Vitals reviewed.  Constitutional:      Appearance: Normal appearance.  HENT:     Head: Normocephalic.     Right Ear: Tympanic membrane, ear canal and external ear normal.      Left Ear: Tympanic membrane, ear canal and external ear normal.  Eyes:     Extraocular Movements: Extraocular movements intact.     Conjunctiva/sclera: Conjunctivae normal.     Pupils: Pupils are equal, round, and reactive to light.  Cardiovascular:     Rate and Rhythm: Normal rate and regular rhythm.     Pulses: Normal pulses.     Heart sounds: Normal heart sounds. No murmur heard.   No gallop.  Pulmonary:     Effort: Pulmonary effort is normal. No respiratory distress.     Breath sounds: No wheezing.  Abdominal:  General: Abdomen is flat. Bowel sounds are normal. There is no distension.     Palpations: Abdomen is soft.     Tenderness: There is no abdominal tenderness.  Musculoskeletal:        General: Normal range of motion.     Cervical back: Normal range of motion.     Right lower leg: No edema.     Left lower leg: No edema.  Skin:    General: Skin is warm.     Capillary Refill: Capillary refill takes less than 2 seconds.  Neurological:     General: No focal deficit present.     Mental Status: He is alert. Mental status is at baseline.  Psychiatric:        Mood and Affect: Mood normal.        Thought Content: Thought content normal.        Judgment: Judgment normal.    BP 128/70   Pulse 60   Temp (!) 97.5 F (36.4 C)   Resp 16   Ht 6' (1.829 m)   Wt (!) 315 lb (142.9 kg)   BMI 42.72 kg/m  Wt Readings from Last 3 Encounters:  12/10/20 (!) 315 lb (142.9 kg)  09/04/20 (!) 303 lb (137.4 kg)  07/19/20 299 lb (135.6 kg)     Health Maintenance Due  Topic Date Due   COVID-19 Vaccine (1) Never done   Pneumococcal Vaccine 75-62 Years old (1 - PCV) Never done   OPHTHALMOLOGY EXAM  Never done   HIV Screening  Never done   Hepatitis C Screening  Never done   TETANUS/TDAP  Never done   Zoster Vaccines- Shingrix (1 of 2) Never done   COLONOSCOPY (Pts 45-58yr Insurance coverage will need to be confirmed)  Never done    There are no preventive care reminders  to display for this patient.  No results found for: TSH Lab Results  Component Value Date   WBC 3.3 (L) 07/19/2020   HGB 15.3 07/19/2020   HCT 43.6 07/19/2020   MCV 91 07/19/2020   PLT 160 07/19/2020   Lab Results  Component Value Date   NA 139 07/19/2020   K 4.6 07/19/2020   CO2 24 07/19/2020   GLUCOSE 112 (H) 07/19/2020   BUN 14 07/19/2020   CREATININE 0.88 07/19/2020   BILITOT 0.6 07/19/2020   ALKPHOS 81 07/19/2020   AST 21 07/19/2020   ALT 20 07/19/2020   PROT 7.1 07/19/2020   ALBUMIN 4.8 07/19/2020   CALCIUM 9.6 07/19/2020   EGFR 98 07/19/2020   Lab Results  Component Value Date   CHOL 178 07/19/2020   Lab Results  Component Value Date   HDL 57 07/19/2020   Lab Results  Component Value Date   LDLCALC 102 (H) 07/19/2020   Lab Results  Component Value Date   TRIG 106 07/19/2020   Lab Results  Component Value Date   CHOLHDL 3.1 07/19/2020   Lab Results  Component Value Date   HGBA1C 6.5 (H) 07/19/2020      Assessment & Plan:   Problem List Items Addressed This Visit       Cardiovascular and Mediastinum   Essential hypertension, benign   Relevant Orders   CBC with Differential/Platelet   Comprehensive metabolic panel An individual hypertension care plan was established and reinforced today.  The patient's status was assessed using clinical findings on exam and labs or diagnostic tests. The patient's success at meeting treatment goals on disease specific evidence-based guidelines and  found to be well controlled. SELF MANAGEMENT: The patient and I together assessed ways to personally work towards obtaining the recommended goals. RECOMMENDATIONS: avoid decongestants found in common cold remedies, decrease consumption of alcohol, perform routine monitoring of BP with home BP cuff, exercise, reduction of dietary salt, take medicines as prescribed, try not to miss doses and quit smoking.  Regular exercise and maintaining a healthy weight is needed.  Stress  reduction may help. A CLINICAL SUMMARY including written plan identify barriers to care unique to individual due to social or financial issues.  We attempt to mutually creat solutions for individual and family understanding.      Endocrine   Diabetic polyneuropathy (HCC) (Chronic)   Relevant Orders   Hemoglobin A1c An individual care plan for diabetes was established and reinforced today.  The patient's status was assessed using clinical findings on exam, labs and diagnostic testing. Patient success at meeting goals based on disease specific evidence-based guidelines and found to be good controlled. Medications were assessed and patient's understanding of the medical issues , including barriers were assessed. Recommend adherence to a diabetic diet, a graduated exercise program, HgbA1c level is checked quarterly, and urine microalbumin performed yearly .  Annual mono-filament sensation testing performed. Lower blood pressure and control hyperlipidemia is important. Get annual eye exams and annual flu shots and smoking cessation discussed.  Self management goals were discussed.      Other   CHI (closed head injury), sequela - Primary (Chronic) He has history of closed head injury that has made him disabled    Encounter for long-term use of opiate analgesic (Chronic) AN INDIVIDUAL CARE PLAN was established and reinforced today.  The patient's status was assessed using clinical findings on exam, labs, and other diagnostic testing. Patient's success at meeting treatment goals based on disease specific evidence-bassed guidelines and found to be in fair control. RECOMMENDATIONS include maintining present medicines and treatment. He is on chronicoxycodone with no abuse.  Negative REMS.     Mixed hyperlipidemia   Relevant Orders   Lipid panel AN INDIVIDUAL CARE PLAN for hyperlipidemia/ cholesterol was established and reinforced today.  The patient's status was assessed using clinical findings on exam,  lab and other diagnostic tests. The patient's disease status was assessed based on evidence-based guidelines and found to be well controlled. MEDICATIONS were reviewed. SELF MANAGEMENT GOALS have been discussed and patient's success at attaining the goal of low cholesterol was assessed. RECOMMENDATION given include regular exercise 3 days a week and low cholesterol/low fat diet. CLINICAL SUMMARY including written plan to identify barriers unique to the patient due to social or economic  reasons was discussed.     Enlarged prostate   Relevant Orders   PSA AN INDIVIDUAL CARE PLAN for BPH was established and reinforced today.  The patient's status was assessed using clinical findings on exam, labs, and other diagnostic testing. Patient's success at meeting treatment goals based on disease specific evidence-bassed guidelines and found to be in good control. RECOMMENDATIONS include maintaining present medicines and treatment.    BMI 40,adult An individualize plan was formulated for obesity using patient history and physical exam to encourage weight loss.  An evidence based program was formulated.  Patient is to cut portion size with meals and to plan physical exercise 3 days a week at least 20 minutes.  Weight watchers and other programs are helpful.  Planned amount of weight loss 10 lbs.  Morbid obesity An individualize plan was formulated for obesity using patient history and  physical exam to encourage weight loss.  An evidence based program was formulated.  Patient is to cut portion size with meals and to plan physical exercise 3 days a week at least 20 minutes.  Weight watchers and other programs are helpful.  Planned amount of weight loss 10 lbs.    Other Visit Diagnoses     Other fatigue       Relevant Orders   TSH Check TSH due to increased fatigue   Encounter for immunization       Relevant Orders   Flu Vaccine MDCK QUAD PF (Completed)          Follow-up: No follow-ups on file.     Reinaldo Meeker, MD

## 2020-12-11 LAB — CARDIOVASCULAR RISK ASSESSMENT

## 2020-12-11 LAB — CBC WITH DIFFERENTIAL/PLATELET
Basophils Absolute: 0.1 10*3/uL (ref 0.0–0.2)
Basos: 2 %
EOS (ABSOLUTE): 0.1 10*3/uL (ref 0.0–0.4)
Eos: 4 %
Hematocrit: 43.8 % (ref 37.5–51.0)
Hemoglobin: 15 g/dL (ref 13.0–17.7)
Immature Grans (Abs): 0 10*3/uL (ref 0.0–0.1)
Immature Granulocytes: 0 %
Lymphocytes Absolute: 1.7 10*3/uL (ref 0.7–3.1)
Lymphs: 53 %
MCH: 32.3 pg (ref 26.6–33.0)
MCHC: 34.2 g/dL (ref 31.5–35.7)
MCV: 94 fL (ref 79–97)
Monocytes Absolute: 0.4 10*3/uL (ref 0.1–0.9)
Monocytes: 12 %
Neutrophils Absolute: 0.9 10*3/uL — ABNORMAL LOW (ref 1.4–7.0)
Neutrophils: 29 %
Platelets: 161 10*3/uL (ref 150–450)
RBC: 4.64 x10E6/uL (ref 4.14–5.80)
RDW: 12.8 % (ref 11.6–15.4)
WBC: 3.2 10*3/uL — ABNORMAL LOW (ref 3.4–10.8)

## 2020-12-11 LAB — HEMOGLOBIN A1C
Est. average glucose Bld gHb Est-mCnc: 143 mg/dL
Hgb A1c MFr Bld: 6.6 % — ABNORMAL HIGH (ref 4.8–5.6)

## 2020-12-11 LAB — COMPREHENSIVE METABOLIC PANEL
ALT: 25 IU/L (ref 0–44)
AST: 25 IU/L (ref 0–40)
Albumin/Globulin Ratio: 2 (ref 1.2–2.2)
Albumin: 4.4 g/dL (ref 3.8–4.8)
Alkaline Phosphatase: 75 IU/L (ref 44–121)
BUN/Creatinine Ratio: 16 (ref 10–24)
BUN: 10 mg/dL (ref 8–27)
Bilirubin Total: 0.5 mg/dL (ref 0.0–1.2)
CO2: 24 mmol/L (ref 20–29)
Calcium: 9.1 mg/dL (ref 8.6–10.2)
Chloride: 101 mmol/L (ref 96–106)
Creatinine, Ser: 0.62 mg/dL — ABNORMAL LOW (ref 0.76–1.27)
Globulin, Total: 2.2 g/dL (ref 1.5–4.5)
Glucose: 110 mg/dL — ABNORMAL HIGH (ref 70–99)
Potassium: 4.2 mmol/L (ref 3.5–5.2)
Sodium: 137 mmol/L (ref 134–144)
Total Protein: 6.6 g/dL (ref 6.0–8.5)
eGFR: 109 mL/min/{1.73_m2} (ref >59)

## 2020-12-11 LAB — LIPID PANEL
Chol/HDL Ratio: 3 ratio (ref 0.0–5.0)
Cholesterol, Total: 143 mg/dL (ref 100–199)
HDL: 47 mg/dL (ref >39)
LDL Chol Calc (NIH): 73 mg/dL (ref 0–99)
Triglycerides: 129 mg/dL (ref 0–149)
VLDL Cholesterol Cal: 23 mg/dL (ref 5–40)

## 2020-12-11 LAB — TSH: TSH: 3.35 u[IU]/mL (ref 0.450–4.500)

## 2020-12-11 LAB — PSA: Prostate Specific Ag, Serum: 0.2 ng/mL (ref 0.0–4.0)

## 2020-12-11 NOTE — Progress Notes (Signed)
Chronic low WBC, glucose 110, kidney tests normal, liver tests normal, A1c 6.6, Ldl cholesterol 102 high, TSH 3.35 normal, PSA 0.2 normal lp

## 2020-12-31 ENCOUNTER — Other Ambulatory Visit: Payer: Self-pay | Admitting: Legal Medicine

## 2021-01-07 ENCOUNTER — Telehealth: Payer: Self-pay

## 2021-01-07 NOTE — Chronic Care Management (AMB) (Signed)
Chronic Care Management Pharmacy Assistant   Name: MAZE CORNIEL  MRN: [id number] DOB: [birth year]   Reason for Encounter: Disease State call for HTN   Recent office visits:  12/10/20 Reinaldo Meeker MD. Seen for Closed head injury. Decreased Rosuvastatin 20 mg to 10 mg daily. Reordered Levofloxacin 500 mg 1 tablet daily, use with UTI.   Recent consult visits:  None   Hospital visits:  None   Medications: Outpatient Encounter Medications as of 01/07/2021  Medication Sig   ALPRAZolam (XANAX) 0.5 MG tablet TAKE 1 TABLET(0.5 MG) BY MOUTH DAILY AS NEEDED   aspirin EC 81 MG tablet Take 81 mg by mouth daily.   bethanechol (URECHOLINE) 25 MG tablet Take 25 mg by mouth daily.   celecoxib (CELEBREX) 200 MG capsule Take 1 capsule (200 mg total) by mouth 2 (two) times daily.   docusate sodium (COLACE) 100 MG capsule Take 100 mg by mouth 2 (two) times daily.   furosemide (LASIX) 20 MG tablet TAKE 1 TABLET(20 MG) BY MOUTH EVERY DAY   furosemide (LASIX) 20 MG tablet TAKE 1 TABLET(20 MG) BY MOUTH EVERY DAY   gabapentin (NEURONTIN) 600 MG tablet TAKE 1 TABLET BY MOUTH THREE TIMES DAILY   glucose blood (ONETOUCH ULTRA) test strip Use as instructed   JARDIANCE 10 MG TABS tablet TAKE 1 TABLET BY MOUTH DAILY   levofloxacin (LEVAQUIN) 500 MG tablet TAKE 1 TABLET BY MOUTH DAILY. USE WITH UTI   LINZESS 145 MCG CAPS capsule TAKE 1 CAPSULE BY MOUTH DAILY 30 MINUTES BEFORE THE FIRST MEAL OF THE DAY   lisinopril (ZESTRIL) 10 MG tablet TAKE 1 TABLET(10 MG) BY MOUTH DAILY   loratadine (CLARITIN) 10 MG tablet Take 10 mg by mouth daily as needed for allergies.   naloxone (NARCAN) 0.4 MG/ML injection Inject 1 mL (0.4 mg total) into the vein as needed.   Omega-3 Fatty Acids (FISH OIL) 1000 MG CAPS Take 1 capsule by mouth daily.   omeprazole (PRILOSEC) 20 MG capsule TAKE 1 CAPSULE BY MOUTH DAILY   oxyCODONE (ROXICODONE) 15 MG immediate release tablet Take 15 mg by mouth every 4 (four) hours as needed.    rosuvastatin (CRESTOR) 10 MG tablet TAKE 1 TABLET(10 MG) BY MOUTH EVERY DAY   tamsulosin (FLOMAX) 0.4 MG CAPS capsule Take 0.4 mg by mouth daily.   No facility-administered encounter medications on file as of 01/07/2021.     Recent Office Vitals: BP Readings from Last 3 Encounters:  12/10/20 128/70  09/04/20 124/80  07/19/20 120/76   Pulse Readings from Last 3 Encounters:  12/10/20 60  09/04/20 64  07/19/20 (!) 54    Wt Readings from Last 3 Encounters:  12/10/20 (!) 315 lb (142.9 kg)  09/04/20 (!) 303 lb (137.4 kg)  07/19/20 299 lb (135.6 kg)     Kidney Function Lab Results  Component Value Date/Time   CREATININE 0.62 (L) 12/10/2020 08:14 AM   CREATININE 0.88 07/19/2020 07:59 AM   GFRNONAA 97 03/21/2020 07:53 AM   GFRAA 112 03/21/2020 07:53 AM    BMP Latest Ref Rng & Units 12/10/2020 07/19/2020 03/21/2020  Glucose 70 - 99 mg/dL 110(H) 112(H) 104(H)  BUN 8 - 27 mg/dL 10 14 13   Creatinine 0.76 - 1.27 mg/dL 0.62(L) 0.88 0.81  BUN/Creat Ratio 10 - 24 16 16 16   Sodium 134 - 144 mmol/L 137 139 139  Potassium 3.5 - 5.2 mmol/L 4.2 4.6 4.8  Chloride 96 - 106 mmol/L 101 99 101  CO2 20 - 29  mmol/L 24 24 23   Calcium 8.6 - 10.2 mg/dL 9.1 9.6 9.6     Current antihypertensive regimen:  Lisinopril 10 mg daily Furosemide 20 mg   Patient verbally confirms he is taking the above medications as directed.   How often are you checking your Blood Pressure? daily  Current home BP readings:   Wrist or arm cuff: Caffeine intake: Salt intake: OTC medications including pseudoephedrine or NSAIDs?  What recent interventions/DTPs have been made by any provider to improve Blood Pressure control since last CPP Visit:   Any recent hospitalizations or ED visits since last visit with CPP?   What diet changes have been made to improve Blood Pressure Control?    What exercise is being done to improve your Blood Pressure Control?    Adherence Review: Is the patient currently on ACE/ARB  medication? Yes Does the patient have >5 day gap between last estimated fill dates? CPP to review  Care Gaps: Last annual wellness visit? None noted   Star Rating Drugs:  Medication:  Last Fill: Day Supply Lisinopril   11/11/20 90ds    08/13/20 90ds  Elray Mcgregor, [place] Pharmacist Assistant  [phone]   Made several attempts and all were unsuccessful at completing call

## 2021-01-21 ENCOUNTER — Other Ambulatory Visit: Payer: Self-pay | Admitting: Legal Medicine

## 2021-02-07 ENCOUNTER — Other Ambulatory Visit: Payer: Self-pay | Admitting: Legal Medicine

## 2021-02-08 ENCOUNTER — Other Ambulatory Visit: Payer: Self-pay | Admitting: Legal Medicine

## 2021-03-10 ENCOUNTER — Other Ambulatory Visit: Payer: Self-pay | Admitting: Legal Medicine

## 2021-03-10 DIAGNOSIS — K5909 Other constipation: Secondary | ICD-10-CM

## 2021-03-19 ENCOUNTER — Telehealth: Payer: Self-pay

## 2021-03-19 NOTE — Chronic Care Management (AMB) (Signed)
Chronic Care Management Pharmacy Assistant   Name: Brad Johnson  MRN: [id number] DOB: March 03, 1960   Reason for Encounter: Disease State call for HTN  Recent office visits:  None  Recent consult visits:  None  Hospital visits:  None  Medications: Outpatient Encounter Medications as of 03/19/2021  Medication Sig   ALPRAZolam (XANAX) 0.5 MG tablet TAKE 1 TABLET(0.5 MG) BY MOUTH DAILY AS NEEDED   aspirin EC 81 MG tablet Take 81 mg by mouth daily.   bethanechol (URECHOLINE) 25 MG tablet Take 25 mg by mouth daily.   celecoxib (CELEBREX) 200 MG capsule TAKE 1 CAPSULE(200 MG) BY MOUTH TWICE DAILY   docusate sodium (COLACE) 100 MG capsule Take 100 mg by mouth 2 (two) times daily.   furosemide (LASIX) 20 MG tablet TAKE 1 TABLET(20 MG) BY MOUTH EVERY DAY   furosemide (LASIX) 20 MG tablet TAKE 1 TABLET(20 MG) BY MOUTH EVERY DAY   gabapentin (NEURONTIN) 600 MG tablet TAKE 1 TABLET BY MOUTH THREE TIMES DAILY   glucose blood (ONETOUCH ULTRA) test strip Use as instructed   JARDIANCE 10 MG TABS tablet TAKE 1 TABLET BY MOUTH DAILY   levofloxacin (LEVAQUIN) 500 MG tablet TAKE 1 TABLET BY MOUTH DAILY FOR 5 DAYS WITH UROLIFT INFECTION   LINZESS 145 MCG CAPS capsule TAKE 1 CAPSULE BY MOUTH DAILY 30 MINUTES BEFORE FIRST MEAL OF THE DAY   lisinopril (ZESTRIL) 10 MG tablet TAKE 1 TABLET(10 MG) BY MOUTH DAILY   loratadine (CLARITIN) 10 MG tablet Take 10 mg by mouth daily as needed for allergies.   naloxone (NARCAN) 0.4 MG/ML injection Inject 1 mL (0.4 mg total) into the vein as needed.   Omega-3 Fatty Acids (FISH OIL) 1000 MG CAPS Take 1 capsule by mouth daily.   omeprazole (PRILOSEC) 20 MG capsule TAKE 1 CAPSULE BY MOUTH DAILY   oxyCODONE (ROXICODONE) 15 MG immediate release tablet Take 15 mg by mouth every 4 (four) hours as needed.   rosuvastatin (CRESTOR) 10 MG tablet TAKE 1 TABLET(10 MG) BY MOUTH EVERY DAY   tamsulosin (FLOMAX) 0.4 MG CAPS capsule Take 0.4 mg by mouth daily.   No  facility-administered encounter medications on file as of 03/19/2021.     Recent Office Vitals: BP Readings from Last 3 Encounters:  12/10/20 128/70  09/04/20 124/80  07/19/20 120/76   Pulse Readings from Last 3 Encounters:  12/10/20 60  09/04/20 64  07/19/20 (!) 54    Wt Readings from Last 3 Encounters:  12/10/20 (!) 315 lb (142.9 kg)  09/04/20 (!) 303 lb (137.4 kg)  07/19/20 299 lb (135.6 kg)     Kidney Function Lab Results  Component Value Date/Time   CREATININE 0.62 (L) 12/10/2020 08:14 AM   CREATININE 0.88 07/19/2020 07:59 AM   GFRNONAA 97 03/21/2020 07:53 AM   GFRAA 112 03/21/2020 07:53 AM    BMP Latest Ref Rng & Units 12/10/2020 07/19/2020 03/21/2020  Glucose 70 - 99 mg/dL 110(H) 112(H) 104(H)  BUN 8 - 27 mg/dL 10 14 13   Creatinine 0.76 - 1.27 mg/dL 0.62(L) 0.88 0.81  BUN/Creat Ratio 10 - 24 16 16 16   Sodium 134 - 144 mmol/L 137 139 139  Potassium 3.5 - 5.2 mmol/L 4.2 4.6 4.8  Chloride 96 - 106 mmol/L 101 99 101  CO2 20 - 29 mmol/L 24 24 23   Calcium 8.6 - 10.2 mg/dL 9.1 9.6 9.6     Current antihypertensive regimen:  Lisinopril 10 mg daily Furosemide 20 mg  Patient verbally confirms  he is taking the above medications as directed. Yes  How often are you checking your Blood Pressure? infrequently  Current home BP readings: Pt is not at home and did not have his readings available. He stated they have been real good and stay good.   Wrist or arm cuff:Arm  Caffeine intake:None Salt intake:Limited  OTC medications including pseudoephedrine or NSAIDs?None  Any readings above 180/120? No  What recent interventions/DTPs have been made by any provider to improve Blood Pressure control since last CPP Visit: Pt denies any changes   Any recent hospitalizations or ED visits since last visit with CPP? No  What diet changes have been made to improve Blood Pressure Control?  Pt stated no changes but tires to eat healthy   What exercise is being done to improve your  Blood Pressure Control?  Pt stays active all the time for exercise   Adherence Review: Is the patient currently on ACE/ARB medication? Yes Does the patient have >5 day gap between last estimated fill dates? CPP to review  Care Gaps: Last annual wellness visit?None noted   Star Rating Drugs:  Medication:  Last Fill: Day Supply Lisinopril                      02/09/21  90ds    02/08/21  90ds  Elray Mcgregor, [place] Pharmacist Assistant  [phone]

## 2021-03-31 DIAGNOSIS — Z79891 Long term (current) use of opiate analgesic: Secondary | ICD-10-CM | POA: Diagnosis not present

## 2021-03-31 DIAGNOSIS — M5459 Other low back pain: Secondary | ICD-10-CM | POA: Diagnosis not present

## 2021-03-31 DIAGNOSIS — G894 Chronic pain syndrome: Secondary | ICD-10-CM | POA: Diagnosis not present

## 2021-04-03 ENCOUNTER — Other Ambulatory Visit: Payer: Self-pay

## 2021-04-03 ENCOUNTER — Telehealth: Payer: PPO

## 2021-04-03 ENCOUNTER — Telehealth: Payer: Self-pay

## 2021-04-03 ENCOUNTER — Ambulatory Visit (INDEPENDENT_AMBULATORY_CARE_PROVIDER_SITE_OTHER): Payer: PPO

## 2021-04-03 DIAGNOSIS — E1142 Type 2 diabetes mellitus with diabetic polyneuropathy: Secondary | ICD-10-CM

## 2021-04-03 DIAGNOSIS — E782 Mixed hyperlipidemia: Secondary | ICD-10-CM

## 2021-04-03 DIAGNOSIS — I10 Essential (primary) hypertension: Secondary | ICD-10-CM

## 2021-04-03 NOTE — Patient Instructions (Signed)
Visit Information   Goals Addressed   None    Patient Care Plan: CCM Pharmacy Care Plan     Problem Identified: DM, HLD, HTN   Priority: High  Onset Date: 04/24/2020     Long-Range Goal: disease management   Start Date: 04/24/2020  Expected End Date: 04/24/2021  Recent Progress: On track  Priority: High  Note:   Current Barriers:  Suboptimal therapeutic regimen for cholesterol management  Pharmacist Clinical Goal(s):  Patient will adhere to plan to optimize therapeutic regimen for cholesterol as evidenced by report of adherence to recommended medication management changes adhere to prescribed medication regimen as evidenced by updated lipid panel results through collaboration with PharmD and provider.   Interventions: 1:1 collaboration with Lillard Anes, MD regarding development and update of comprehensive plan of care as evidenced by provider attestation and co-signature Inter-disciplinary care team collaboration (see longitudinal plan of care) Comprehensive medication review performed; medication list updated in electronic medical record  Hypertension (BP goal <130/80) BP Readings from Last 3 Encounters:  12/10/20 128/70  09/04/20 124/80  07/19/20 120/76  -Controlled -Current treatment: lisinopril 10 mg daily Appropriate, Effective, Safe, Accessible Furosemide 20 mg daily prn Appropriate, Effective, Safe, Accessible -Medications previously tried: none reported  -Current home readings: well controlled but not recorded  -Current dietary habits: cooks at home. Likes vegetable plate for supper. Eats a lot of fruit. Does eat some bacon/sausage with eggs for breakfast.  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  -Denies hypotensive/hypertensive symptoms -Educated on BP goals and benefits of medications for prevention of heart attack, stroke and kidney damage; Daily salt intake goal < 2300 mg; Exercise goal of 150  minutes per week; Importance of home blood pressure monitoring; -Counseled to monitor BP at home weekly, document, and provide log at future appointments -Counseled on diet and exercise extensively Recommended to continue current medication  Hyperlipidemia: (LDL goal < 70) Lab Results  Component Value Date   CHOL 143 12/10/2020   CHOL 178 07/19/2020   CHOL 167 03/21/2020   Lab Results  Component Value Date   HDL 47 12/10/2020   HDL 57 07/19/2020   HDL 58 03/21/2020   Lab Results  Component Value Date   LDLCALC 73 12/10/2020   LDLCALC 102 (H) 07/19/2020   LDLCALC 96 03/21/2020   Lab Results  Component Value Date   TRIG 129 12/10/2020   TRIG 106 07/19/2020   TRIG 70 03/21/2020   Lab Results  Component Value Date   CHOLHDL 3.0 12/10/2020   CHOLHDL 3.1 07/19/2020   CHOLHDL 2.9 03/21/2020  No results found for: LDLDIRECT -Not ideally controlled -Current treatment: rosuvastatin 10 mg daily Query Appropriate, Query effective, Safe, Accessible Omega 3 fish oil daily Appropriate, Effective, Safe, Accessible -Medications previously tried: none reported  -Current dietary patterns: eats a lot of fruit. Eats mainly vegetables for supper. -Current exercise habits: [place]  -Educated on Cholesterol goals;  Benefits of statin for ASCVD risk reduction; Importance of limiting foods high in cholesterol; Exercise goal of 150 minutes per week; -Counseled on diet and exercise extensively Recommended increasing Crestor to 20 mg daily.   Diabetes (A1c goal <7%) Lab Results  Component Value Date   HGBA1C 6.6 (H) 12/10/2020   HGBA1C 6.5 (H) 07/19/2020   HGBA1C 6.3 (H) 03/21/2020   Lab Results  Component Value Date   LDLCALC 73 12/10/2020   CREATININE 0.62 (L)  12/10/2020   Lab Results  Component Value Date   NA 137 12/10/2020   K 4.2 12/10/2020   CREATININE 0.62 (L) 12/10/2020   EGFR 109 12/10/2020   GFRNONAA 97  03/21/2020   GLUCOSE 110 (H) 12/10/2020   Lab Results  Component Value Date   WBC 3.2 (L) 12/10/2020   HGB 15.0 12/10/2020   HCT 43.8 12/10/2020   MCV 94 12/10/2020   PLT 161 12/10/2020  -Controlled -Current medications: Jardiance 10 mg daily Appropriate, Effective, Safe, Accessible -Medications previously tried: none reproted  -Current home glucose readings fasting glucose:  March 2022: 106, 102, 120, 118 post prandial glucose: not checking  -Denies hypoglycemic/hyperglycemic symptoms -Current meal patterns:  breakfast:  bacon/sausage and eggs, cheerios/raisin bran or shake lunch:  fruit (banana, apple, peach or pear) dinner:  eats around 3 pm - vegetable plate typically snacks:  occasional oatmeal cookie. Fruit.  drinks: drinks strawberry protein shakes/ensure -Current exercise: works around farm or fishes.  -Educated on A1c and blood sugar goals; Complications of diabetes including kidney damage, retinal damage, and cardiovascular disease; Exercise goal of 150 minutes per week; Benefits of weight loss; Benefits of routine self-monitoring of blood sugar; Carbohydrate counting and/or plate method -Counseled to check feet daily and get yearly eye exams -Counseled on diet and exercise extensively Recommended to continue current medication Recommended patient continuing to check blood sugar and record in log.  Educated on benefits of blood sugar management and importance of taking medication daily even though a1c is well controlled.    Adjustment with Depressed mood (Goal: manage anxiety) -Controlled -Current treatment  alprazolam 0.5 mg daily prn - rarely uses Appropriate, Effective, Safe, Accessible -Medications previously tried: none reported  -Counseled on danger of using with narcotics.  Patient reports minimizing tv and world news to help with anxiety. Patient reports that he has not taken any in last 2 weeks. 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 Patient reports that watching world news increases his anxiety so he avoids.   IBS with Reflux (Goal: control symptoms of IBS) -Controlled -Current treatment  docusate 100 mg bid prn Appropriate, Effective, Safe, Accessible Linzess 145 mcg daily 30 minutes before the first meal of the day Appropriate, Effective, Safe, Accessible Omeprazole 20 mg daily Appropriate, Effective, Safe, Accessible -Medications previously tried: none reported  -Counseled on diet and exercise extensively Recommended to continue current medication Educated on smaller meals and avoiding trigger foods   Patient Goals/Self-Care Activities Patient will:  - take medications as prescribed focus on medication adherence by continuing to use chart and pill box check glucose 2-3 times weekly, document, and provide at future appointments target a minimum of 150 minutes of moderate intensity exercise weekly engage in dietary modifications by focusing on lean protein, vegetables and fruit.   Follow Up Plan: Telephone follow up appointment with care management team member scheduled for: Will coordinate with son for future date  [place] Constable, Pharm.D. - [phone]      Brad Johnson was given information about Chronic Care Management services today including:  CCM service includes personalized support from designated clinical staff supervised by his physician, including individualized plan of care and coordination with other care providers 24/7 contact phone numbers for assistance for urgent and routine care needs. Standard insurance, coinsurance, copays and deductibles apply for chronic care management only during months in which we provide at least 20 minutes of these services. Most insurances cover these services at 100%, however patients  may be responsible for any copay, coinsurance and/or deductible if applicable. This service may help you avoid the need for more expensive face-to-face  services. Only one practitioner may furnish and bill the service in a calendar month. The patient may stop CCM services at any time (effective at the end of the month) by phone call to the office staff.  Patient agreed to services and verbal consent obtained.   The patient verbalized understanding of instructions, educational materials, and care plan provided today and declined offer to receive copy of patient instructions, educational materials, and care plan.  The pharmacy team will reach out to the patient again over the next 60 days.   Lane Hacker, [place]
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
## 2021-04-13 NOTE — Progress Notes (Signed)
Subjective:  Patient ID: Brad Johnson, male    DOB: 1960/02/24  Age: 61 y.o. MRN: [id number]  Chief Complaint  Patient presents with   Diabetes   Hyperlipidemia   Hypertension    HPI: chronic visit  He is having worsening arthritis, legs get heavy, makes him stop.  Cramping in legs.   Patient present with type 2 diabetes.  Specifically, this is type 2, noninsulin requiring diabetes, complicated by hypertension, hyperlipidemia. Compliance with treatment has been good; patient take medicines as directed, maintains diet and exercise regimen, follows up as directed, and is keeping glucose diary. Current medicines for diabetes Jardiance 10 mg daily.  Patient performs foot exams daily and last ophthalmologic exam was few years ago. Last A1C was 6.6%.  Patient presents with hyperlipidemia.  Compliance with treatment has been good; patient takes medicines as directed, maintains low cholesterol diet, follows up as directed, and maintains exercise regimen.  Patient is using Fish oil 1000 mg daily, Rosuvastatin 10 mg daily without problems.   Hypertension: Patient takes Aspirin 81 mg daily, frurosemide 20 mg daily, lisinopril 10 mg daily. Current Outpatient Medications on File Prior to Visit  Medication Sig Dispense Refill   ALPRAZolam (XANAX) 0.5 MG tablet TAKE 1 TABLET(0.5 MG) BY MOUTH DAILY AS NEEDED 30 tablet 3   aspirin EC 81 MG tablet Take 81 mg by mouth daily.     bethanechol (URECHOLINE) 25 MG tablet Take 25 mg by mouth daily.     celecoxib (CELEBREX) 200 MG capsule TAKE 1 CAPSULE(200 MG) BY MOUTH TWICE DAILY 60 capsule 6   docusate sodium (COLACE) 100 MG capsule Take 100 mg by mouth 2 (two) times daily.     furosemide (LASIX) 20 MG tablet TAKE 1 TABLET(20 MG) BY MOUTH EVERY DAY 30 tablet 6   furosemide (LASIX) 20 MG tablet TAKE 1 TABLET(20 MG) BY MOUTH EVERY DAY 30 tablet 6   gabapentin (NEURONTIN) 600 MG tablet TAKE 1 TABLET BY MOUTH THREE TIMES DAILY 270 tablet 2   glucose blood  (ONETOUCH ULTRA) test strip Use as instructed 100 each 12   JARDIANCE 10 MG TABS tablet TAKE 1 TABLET BY MOUTH DAILY 90 tablet 2   levofloxacin (LEVAQUIN) 500 MG tablet TAKE 1 TABLET BY MOUTH DAILY FOR 5 DAYS WITH UROLIFT INFECTION 30 tablet 0   LINZESS 145 MCG CAPS capsule TAKE 1 CAPSULE BY MOUTH DAILY 30 MINUTES BEFORE FIRST MEAL OF THE DAY 90 capsule 2   lisinopril (ZESTRIL) 10 MG tablet TAKE 1 TABLET(10 MG) BY MOUTH DAILY 90 tablet 2   loratadine (CLARITIN) 10 MG tablet Take 10 mg by mouth daily as needed for allergies.     naloxone (NARCAN) 0.4 MG/ML injection Inject 1 mL (0.4 mg total) into the vein as needed. 1 mL 3   Omega-3 Fatty Acids (FISH OIL) 1000 MG CAPS Take 1 capsule by mouth daily.     omeprazole (PRILOSEC) 20 MG capsule TAKE 1 CAPSULE BY MOUTH DAILY 90 capsule 2   oxyCODONE (ROXICODONE) 15 MG immediate release tablet Take 15 mg by mouth every 4 (four) hours as needed.     rosuvastatin (CRESTOR) 10 MG tablet TAKE 1 TABLET(10 MG) BY MOUTH EVERY DAY 90 tablet 2   tamsulosin (FLOMAX) 0.4 MG CAPS capsule Take 0.4 mg by mouth daily.     No current facility-administered medications on file prior to visit.   Past Medical History:  Diagnosis Date   Arthritis    Depression    Hyperlipidemia  Prostate enlargement    Past Surgical History:  Procedure Laterality Date   CYSTOSCOPY WITH INSERTION OF UROLIFT     FRACTURE SURGERY     SPINE SURGERY     TOTAL HIP ARTHROPLASTY      History reviewed. No pertinent family history. Social History   Socioeconomic History   Marital status: Widowed    Spouse name: Not on file   Number of children: Not on file   Years of education: Not on file   Highest education level: Not on file  Occupational History   Not on file  Tobacco Use   Smoking status: Never   Smokeless tobacco: Never  Vaping Use   Vaping Use: Never used  Substance and Sexual Activity   Alcohol use: Never   Drug use: Never   Sexual activity: Not on file  Other  Topics Concern   Not on file  Social History Narrative   Not on file   Social Determinants of Health   Financial Resource Strain: Low Risk    Difficulty of Paying Living Expenses: Not hard at all  Food Insecurity: No Food Insecurity   Worried About Running Out of Food in the Last Year: Never true   Ran Out of Food in the Last Year: Never true  Transportation Needs: No Transportation Needs   Lack of Transportation (Medical): No   Lack of Transportation (Non-Medical): No  Physical Activity: Not on file  Stress: Not on file  Social Connections: Not on file    Review of Systems  Constitutional:  Negative for chills, fatigue, fever and unexpected weight change.  HENT:  Negative for congestion, rhinorrhea, sinus pressure, sneezing and sore throat.   Eyes:  Negative for discharge and visual disturbance.  Respiratory:  Negative for cough, shortness of breath and wheezing.   Cardiovascular:  Negative for chest pain and palpitations.  Gastrointestinal:  Negative for abdominal pain, diarrhea, nausea and vomiting.  Endocrine: Negative for polydipsia, polyphagia and polyuria.  Genitourinary:  Negative for decreased urine volume, difficulty urinating, dysuria, frequency, penile swelling and urgency.  Musculoskeletal:  Negative for back pain, gait problem, joint swelling, neck pain and neck stiffness.  Neurological:  Negative for dizziness, seizures, weakness, numbness and headaches.  Psychiatric/Behavioral:  Negative for confusion, hallucinations, sleep disturbance and suicidal ideas. The patient is not nervous/anxious and is not hyperactive.     Objective:  BP 112/64   Pulse 62   Temp 98.5 F (36.9 C)   Ht 6' (1.829 m)   Wt (!) 314 lb 6.4 oz (142.6 kg)   SpO2 96%   BMI 42.64 kg/m   BP/Weight 04/14/2021 12/10/2020 09/04/2020  Systolic BP 112 128 124  Diastolic BP 64 70 80  Wt. (Lbs) 314.4 315 303  BMI 42.64 42.72 42.26    Physical Exam Vitals reviewed.  Constitutional:       General: He is not in acute distress.    Appearance: Normal appearance. He is obese.  HENT:     Right Ear: Tympanic membrane normal.     Left Ear: Tympanic membrane normal.     Nose: Congestion present.  Eyes:     Extraocular Movements: Extraocular movements intact.     Conjunctiva/sclera: Conjunctivae normal.     Pupils: Pupils are equal, round, and reactive to light.  Cardiovascular:     Rate and Rhythm: Normal rate and regular rhythm.     Pulses: Normal pulses.     Heart sounds: Normal heart sounds. No murmur heard.   No  gallop.  Pulmonary:     Effort: Pulmonary effort is normal. No respiratory distress.     Breath sounds: No wheezing.  Abdominal:     General: Abdomen is flat. Bowel sounds are normal. There is no distension.     Tenderness: There is no abdominal tenderness.     Comments: No bruits  Musculoskeletal:     Cervical back: Normal range of motion and neck supple.  Skin:    General: Skin is warm and dry.     Capillary Refill: Capillary refill takes 2 to 3 seconds.  Neurological:     General: No focal deficit present.     Mental Status: He is alert and oriented to person, place, and time. Mental status is at baseline.     Cranial Nerves: No cranial nerve deficit.  Psychiatric:        Mood and Affect: Mood normal.        Behavior: Behavior normal.    Diabetic Foot Exam - Simple   Simple Foot Form Diabetic Foot exam was performed with the following findings: Yes 04/14/2021  7:47 AM  Visual Inspection No deformities, no ulcerations, no other skin breakdown bilaterally: Yes Sensation Testing Pulse Check Posterior Tibialis and Dorsalis pulse intact bilaterally: Yes Comments No sensation in eft foot, decreased in right foot      Lab Results  Component Value Date   WBC 3.2 (L) 12/10/2020   HGB 15.0 12/10/2020   HCT 43.8 12/10/2020   PLT 161 12/10/2020   GLUCOSE 110 (H) 12/10/2020   CHOL 143 12/10/2020   TRIG 129 12/10/2020   HDL 47 12/10/2020   LDLCALC 73  12/10/2020   ALT 25 12/10/2020   AST 25 12/10/2020   NA 137 12/10/2020   K 4.2 12/10/2020   CL 101 12/10/2020   CREATININE 0.62 (L) 12/10/2020   BUN 10 12/10/2020   CO2 24 12/10/2020   TSH 3.350 12/10/2020   INR 0.96 06/25/2009   HGBA1C 6.6 (H) 12/10/2020      Assessment & Plan:   Problem List Items Addressed This Visit       Cardiovascular and Mediastinum   Essential hypertension, benign - Primary   Relevant Orders   Comprehensive metabolic panel   CBC with Differential/Platelet An individual hypertension care plan was established and reinforced today.  The patient's status was assessed using clinical findings on exam and labs or diagnostic tests. The patient's success at meeting treatment goals on disease specific evidence-based guidelines and found to be well controlled. SELF MANAGEMENT: The patient and I together assessed ways to personally work towards obtaining the recommended goals. RECOMMENDATIONS: avoid decongestants found in common cold remedies, decrease consumption of alcohol, perform routine monitoring of BP with home BP cuff, exercise, reduction of dietary salt, take medicines as prescribed, try not to miss doses and quit smoking.  Regular exercise and maintaining a healthy weight is needed.  Stress reduction may help. A CLINICAL SUMMARY including written plan identify barriers to care unique to individual due to social or financial issues.  We attempt to mutually creat solutions for individual and family understanding.      Digestive   Irritable bowel syndrome IBS is stable now    Esophageal reflux Plan of care was formulated today.  he is doing well.  A plan of care was formulated using patient exam, tests and other sources to optimize care using evidence based information.  Recommend no smoking, no eating after supper, avoid fatty foods, elevate Head of bed, avoid  tight fitting clothing.  Continue on omeprazole.      Endocrine   Diabetic polyneuropathy (HCC)  (Chronic)   Relevant Medications   Semaglutide,0.25 or 0.5MG /DOS, (OZEMPIC, 0.25 OR 0.5 MG/DOSE,) 2 MG/1.5ML SOPN   Other Relevant Orders   Hemoglobin A1c   Microalbumin / creatinine urine ratio An individual care plan for diabetes was established and reinforced today.  The patient's status was assessed using clinical findings on exam, labs and diagnostic testing. Patient success at meeting goals based on disease specific evidence-based guidelines and found to be good controlled. Medications were assessed and patient's understanding of the medical issues , including barriers were assessed. Recommend adherence to a diabetic diet, a graduated exercise program, HgbA1c level is checked quarterly, and urine microalbumin performed yearly .  Annual mono-filament sensation testing performed. Lower blood pressure and control hyperlipidemia is important. Get annual eye exams and annual flu shots and smoking cessation discussed.  Self management goals were discussed.      Nervous and Auditory   Polyneuropathy Patient has polyneuropathy from back surgery and diabetes     Genitourinary   Enlarged prostate with urinary obstruction   Relevant Orders   PSA Start on flomax     Other   Adjustment disorder with depressed mood (Chronic) Patient's depression is controlled with no medicines.   Anhedonia better.  PHQ 9 was prformed score zero. An individual care plan was established or reinforced today.  The patient's disease status was assessed using clinical findings on exam, labs, and or other diagnostic testing to determine patient's success in meeting treatment goals based on disease specific evidence-based guidelines and found to be improving Recommendations include stay off medicines.     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  The patient's status was assessed using clinical findings on exam, lab and other diagnostic tests. The patient's disease status was assessed based on evidence-based guidelines and found to be fair controlled. MEDICATIONS were reviewed. SELF MANAGEMENT GOALS have been discussed and patient's success at attaining the goal of low cholesterol was assessed. RECOMMENDATION given include regular exercise 3 days a week and low cholesterol/low fat diet. CLINICAL SUMMARY including written plan to identify barriers unique to the patient due to social or economic  reasons was discussed.    Other Visit Diagnoses     Morbid obesity (HCC)   (Chronic)     Relevant Medications    An individualize plan was formulated for obesity using patient history and physical exam to encourage weight loss.  An evidence based program was formulated.  Patient is to cut portion size with meals and to plan physical exercise 3 days a week at least 20 minutes.  Weight watchers and other programs are helpful.  Planned amount of weight loss 10 lbs. BMI 42   Intermittent claudication (HCC)       Relevant Orders   VAS [place] ABI WITH/WO TBI Check pulses for possible vascular claudication as well as claudication from spinal crd     .  Meds ordered this encounter  Medications   Semaglutide,0.25 or 0.5MG /DOS, (OZEMPIC, 0.25 OR 0.5 MG/DOSE,) 2 MG/1.5ML SOPN    Sig: Inject 0.5 mg into the skin once a week.    Dispense:  6 mL  Refill:  3    Orders Placed This Encounter  Procedures   Comprehensive metabolic panel   Hemoglobin A1c   Lipid panel   Microalbumin / creatinine urine ratio   CBC with Differential/Platelet   PSA   VAS [place] ABI WITH/WO TBI   35 minute visit with review of records and explanations due to his illiteracy  Follow-up: Return in about 4 years (around 04/14/2025) for fasting.  An After Visit Summary was  printed and given to the patient.  Brent Bulla, MD [facility] [phone]

## 2021-04-14 ENCOUNTER — Encounter: Payer: Self-pay | Admitting: Legal Medicine

## 2021-04-14 ENCOUNTER — Other Ambulatory Visit: Payer: Self-pay

## 2021-04-14 ENCOUNTER — Ambulatory Visit (INDEPENDENT_AMBULATORY_CARE_PROVIDER_SITE_OTHER): Payer: PPO | Admitting: Legal Medicine

## 2021-04-14 VITALS — BP 112/64 | HR 62 | Temp 98.5°F | Ht 72.0 in | Wt 314.4 lb

## 2021-04-14 DIAGNOSIS — Z6841 Body Mass Index (BMI) 40.0 and over, adult: Secondary | ICD-10-CM

## 2021-04-14 DIAGNOSIS — Z550 Illiteracy and low-level literacy: Secondary | ICD-10-CM

## 2021-04-14 DIAGNOSIS — N401 Enlarged prostate with lower urinary tract symptoms: Secondary | ICD-10-CM | POA: Diagnosis not present

## 2021-04-14 DIAGNOSIS — E782 Mixed hyperlipidemia: Secondary | ICD-10-CM | POA: Diagnosis not present

## 2021-04-14 DIAGNOSIS — E1142 Type 2 diabetes mellitus with diabetic polyneuropathy: Secondary | ICD-10-CM | POA: Diagnosis not present

## 2021-04-14 DIAGNOSIS — I10 Essential (primary) hypertension: Secondary | ICD-10-CM | POA: Diagnosis not present

## 2021-04-14 DIAGNOSIS — N138 Other obstructive and reflux uropathy: Secondary | ICD-10-CM

## 2021-04-14 DIAGNOSIS — F4321 Adjustment disorder with depressed mood: Secondary | ICD-10-CM

## 2021-04-14 DIAGNOSIS — G629 Polyneuropathy, unspecified: Secondary | ICD-10-CM | POA: Diagnosis not present

## 2021-04-14 DIAGNOSIS — S0990XS Unspecified injury of head, sequela: Secondary | ICD-10-CM | POA: Diagnosis not present

## 2021-04-14 DIAGNOSIS — K581 Irritable bowel syndrome with constipation: Secondary | ICD-10-CM | POA: Diagnosis not present

## 2021-04-14 DIAGNOSIS — K2100 Gastro-esophageal reflux disease with esophagitis, without bleeding: Secondary | ICD-10-CM | POA: Diagnosis not present

## 2021-04-14 DIAGNOSIS — I739 Peripheral vascular disease, unspecified: Secondary | ICD-10-CM | POA: Diagnosis not present

## 2021-04-14 MED ORDER — OZEMPIC (0.25 OR 0.5 MG/DOSE) 2 MG/1.5ML ~~LOC~~ SOPN: 0.5000 mg | PEN_INJECTOR | SUBCUTANEOUS | 3 refills | Status: DC

## 2021-04-15 LAB — MICROALBUMIN / CREATININE URINE RATIO
Creatinine, Urine: 138.8 mg/dL
Microalb/Creat Ratio: 5 mg/g creat (ref 0–29)
Microalbumin, Urine: 7.1 ug/mL

## 2021-04-15 LAB — CBC WITH DIFFERENTIAL/PLATELET
Basophils Absolute: 0 10*3/uL (ref 0.0–0.2)
Basos: 1 %
EOS (ABSOLUTE): 0.2 10*3/uL (ref 0.0–0.4)
Eos: 6 %
Hematocrit: 43.9 % (ref 37.5–51.0)
Hemoglobin: 15.4 g/dL (ref 13.0–17.7)
Immature Grans (Abs): 0 10*3/uL (ref 0.0–0.1)
Immature Granulocytes: 0 %
Lymphocytes Absolute: 1.5 10*3/uL (ref 0.7–3.1)
Lymphs: 53 %
MCH: 31.6 pg (ref 26.6–33.0)
MCHC: 35.1 g/dL (ref 31.5–35.7)
MCV: 90 fL (ref 79–97)
Monocytes Absolute: 0.3 10*3/uL (ref 0.1–0.9)
Monocytes: 10 %
Neutrophils Absolute: 0.9 10*3/uL — ABNORMAL LOW (ref 1.4–7.0)
Neutrophils: 30 %
Platelets: 180 10*3/uL (ref 150–450)
RBC: 4.88 x10E6/uL (ref 4.14–5.80)
RDW: 12.3 % (ref 11.6–15.4)
WBC: 2.9 10*3/uL — ABNORMAL LOW (ref 3.4–10.8)

## 2021-04-15 LAB — HEMOGLOBIN A1C
Est. average glucose Bld gHb Est-mCnc: 146 mg/dL
Hgb A1c MFr Bld: 6.7 % — ABNORMAL HIGH (ref 4.8–5.6)

## 2021-04-15 LAB — COMPREHENSIVE METABOLIC PANEL
ALT: 33 IU/L (ref 0–44)
AST: 29 IU/L (ref 0–40)
Albumin/Globulin Ratio: 2.4 — ABNORMAL HIGH (ref 1.2–2.2)
Albumin: 4.5 g/dL (ref 3.8–4.8)
Alkaline Phosphatase: 84 IU/L (ref 44–121)
BUN/Creatinine Ratio: 16 (ref 10–24)
BUN: 12 mg/dL (ref 8–27)
Bilirubin Total: 0.4 mg/dL (ref 0.0–1.2)
CO2: 24 mmol/L (ref 20–29)
Calcium: 9.2 mg/dL (ref 8.6–10.2)
Chloride: 102 mmol/L (ref 96–106)
Creatinine, Ser: 0.76 mg/dL (ref 0.76–1.27)
Globulin, Total: 1.9 g/dL (ref 1.5–4.5)
Glucose: 121 mg/dL — ABNORMAL HIGH (ref 70–99)
Potassium: 4.5 mmol/L (ref 3.5–5.2)
Sodium: 138 mmol/L (ref 134–144)
Total Protein: 6.4 g/dL (ref 6.0–8.5)
eGFR: 102 mL/min/{1.73_m2} (ref >59)

## 2021-04-15 LAB — LIPID PANEL
Chol/HDL Ratio: 4.9 ratio (ref 0.0–5.0)
Cholesterol, Total: 235 mg/dL — ABNORMAL HIGH (ref 100–199)
HDL: 48 mg/dL (ref >39)
LDL Chol Calc (NIH): 158 mg/dL — ABNORMAL HIGH (ref 0–99)
Triglycerides: 157 mg/dL — ABNORMAL HIGH (ref 0–149)
VLDL Cholesterol Cal: 29 mg/dL (ref 5–40)

## 2021-04-15 LAB — PSA: Prostate Specific Ag, Serum: 0.2 ng/mL (ref 0.0–4.0)

## 2021-04-15 LAB — CARDIOVASCULAR RISK ASSESSMENT

## 2021-04-16 NOTE — Progress Notes (Signed)
Glucose 121, kidney tests normal, A1c 6.7 ok, CBC chronic low WBC- no changes, microalbuminuria negative, PSA 0.2 normal ?lp

## 2021-04-24 DIAGNOSIS — M79604 Pain in right leg: Secondary | ICD-10-CM | POA: Diagnosis not present

## 2021-04-24 DIAGNOSIS — M79605 Pain in left leg: Secondary | ICD-10-CM | POA: Diagnosis not present

## 2021-04-24 DIAGNOSIS — I739 Peripheral vascular disease, unspecified: Secondary | ICD-10-CM | POA: Diagnosis not present

## 2021-04-24 DIAGNOSIS — R252 Cramp and spasm: Secondary | ICD-10-CM | POA: Diagnosis not present

## 2021-04-25 ENCOUNTER — Other Ambulatory Visit: Payer: Self-pay

## 2021-04-25 DIAGNOSIS — I739 Peripheral vascular disease, unspecified: Secondary | ICD-10-CM

## 2021-05-02 DIAGNOSIS — E1142 Type 2 diabetes mellitus with diabetic polyneuropathy: Secondary | ICD-10-CM

## 2021-05-02 DIAGNOSIS — E782 Mixed hyperlipidemia: Secondary | ICD-10-CM

## 2021-05-02 DIAGNOSIS — I10 Essential (primary) hypertension: Secondary | ICD-10-CM

## 2021-05-05 ENCOUNTER — Other Ambulatory Visit: Payer: Self-pay | Admitting: Legal Medicine

## 2021-05-05 DIAGNOSIS — G629 Polyneuropathy, unspecified: Secondary | ICD-10-CM

## 2021-05-05 DIAGNOSIS — I10 Essential (primary) hypertension: Secondary | ICD-10-CM

## 2021-05-22 ENCOUNTER — Other Ambulatory Visit: Payer: Self-pay

## 2021-05-22 DIAGNOSIS — E1142 Type 2 diabetes mellitus with diabetic polyneuropathy: Secondary | ICD-10-CM

## 2021-05-22 MED ORDER — OZEMPIC (0.25 OR 0.5 MG/DOSE) 2 MG/1.5ML ~~LOC~~ SOPN: 0.5000 mg | PEN_INJECTOR | SUBCUTANEOUS | 3 refills | Status: DC

## 2021-05-23 ENCOUNTER — Ambulatory Visit: Payer: PPO

## 2021-05-23 ENCOUNTER — Other Ambulatory Visit: Payer: Self-pay | Admitting: Legal Medicine

## 2021-06-02 ENCOUNTER — Other Ambulatory Visit: Payer: Self-pay | Admitting: Legal Medicine

## 2021-06-10 DIAGNOSIS — M1712 Unilateral primary osteoarthritis, left knee: Secondary | ICD-10-CM | POA: Diagnosis not present

## 2021-06-12 ENCOUNTER — Telehealth: Payer: Self-pay

## 2021-06-12 NOTE — Progress Notes (Signed)
? ? ?Chronic Care Management ?Pharmacy Assistant  ? ?Name: Brad Johnson  MRN: [id number] DOB: 04-06-1960 ? ? ?Reason for Encounter: General Adherence Call  ?  ?Recent office visits:  ?04/14/21 Reinaldo Meeker MD. Seen for routine visit. Started on Semaglutide 0.'5mg'$ .  ? ?Recent consult visits:  ?None ? ?Hospital visits:  ?None ? ?Medications: ?Outpatient Encounter Medications as of 06/12/2021  ?Medication Sig  ? ALPRAZolam (XANAX) 0.5 MG tablet TAKE 1 TABLET(0.5 MG) BY MOUTH DAILY AS NEEDED  ? aspirin EC 81 MG tablet Take 81 mg by mouth daily.  ? bethanechol (URECHOLINE) 25 MG tablet Take 25 mg by mouth daily.  ? celecoxib (CELEBREX) 200 MG capsule TAKE 1 CAPSULE(200 MG) BY MOUTH TWICE DAILY  ? docusate sodium (COLACE) 100 MG capsule Take 100 mg by mouth 2 (two) times daily.  ? furosemide (LASIX) 20 MG tablet TAKE 1 TABLET(20 MG) BY MOUTH EVERY DAY  ? furosemide (LASIX) 20 MG tablet TAKE 1 TABLET(20 MG) BY MOUTH EVERY DAY  ? gabapentin (NEURONTIN) 600 MG tablet TAKE 1 TABLET BY MOUTH THREE TIMES DAILY  ? glucose blood (ONETOUCH ULTRA) test strip Use as instructed  ? JARDIANCE 10 MG TABS tablet TAKE 1 TABLET BY MOUTH DAILY  ? levofloxacin (LEVAQUIN) 500 MG tablet TAKE 1 TABLET BY MOUTH DAILY FOR 5 DAYS WITH UROLIFT INFECTION  ? LINZESS 145 MCG CAPS capsule TAKE 1 CAPSULE BY MOUTH DAILY 30 MINUTES BEFORE FIRST MEAL OF THE DAY  ? lisinopril (ZESTRIL) 10 MG tablet TAKE 1 TABLET(10 MG) BY MOUTH DAILY  ? loratadine (CLARITIN) 10 MG tablet Take 10 mg by mouth daily as needed for allergies.  ? naloxone (NARCAN) 0.4 MG/ML injection Inject 1 mL (0.4 mg total) into the vein as needed.  ? Omega-3 Fatty Acids (FISH OIL) 1000 MG CAPS Take 1 capsule by mouth daily.  ? omeprazole (PRILOSEC) 20 MG capsule TAKE 1 CAPSULE BY MOUTH DAILY  ? oxyCODONE (ROXICODONE) 15 MG immediate release tablet Take 15 mg by mouth every 4 (four) hours as needed.  ? rosuvastatin (CRESTOR) 10 MG tablet TAKE 1 TABLET(10 MG) BY MOUTH EVERY DAY  ?  Semaglutide,0.25 or 0.'5MG'$ /DOS, (OZEMPIC, 0.25 OR 0.5 MG/DOSE,) 2 MG/1.5ML SOPN Inject 0.5 mg into the skin once a week.  ? tamsulosin (FLOMAX) 0.4 MG CAPS capsule Take 0.4 mg by mouth daily.  ? ?No facility-administered encounter medications on file as of 06/12/2021.  ? ? ?[place] for General Review Call ? ? ?Chart Review: ? ?Have there been any documented new, changed, or discontinued medications since last visit? Yes Started on Semaglutide 0.'5mg'$  on 04/14/21 ?Has there been any documented recent hospitalizations or ED visits since last visit with Clinical Pharmacist? No ?Brief Summary (including medication and/or Diagnosis changes):None ? ? ?Adherence Review: ? ?Does the Clinical Pharmacist Assistant have access to adherence rates? Yes ?Adherence rates for STAR metric medications (List medication(s)/day supply/ last 2 fill dates).Semaglutide 05/26/21 28ds, 04/14/21 28ds, Lisinopril 05/05/21 90ds, 02/09/21 90ds Rosuvastatin 04/09/21 90ds, 03/02/21 90ds, Jardiance 04/10/21 90ds, 01/12/21 90ds ?Adherence rates for medications indicated for disease state being reviewed (List medication(s)/day supply/ last 2 fill dates). ?Does the patient have >5 day gap between last estimated fill dates for any of the above medications or other medication gaps? No ?Reason for medication gaps.None ? ? ?Disease State Questions: ? ?Able to connect with Patient? Yes ?Did patient have any problems with their health recently? No ?Note problems and Concerns: ?Have you had any admissions or emergency room visits or  worsening of your condition(s) since last visit? No ?Details of ED visit, hospital visit and/or worsening condition(s): ?Have you had any visits with new specialists or providers since your last visit? No ?Explain: ?Have you had any new health care problem(s) since your last visit? No ?New problem(s) reported: ?Have you run out of any of your medications since you last spoke with clinical pharmacist? No ?What caused you to run  out of your medications? ?Are there any medications you are not taking as prescribed? No ?What kept you from taking your medications as prescribed? ?Are you having any issues or side effects with your medications? No ?Note of issues or side effects: ?Do you have any other health concerns or questions you want to discuss with your Clinical Pharmacist before your next visit? No ?Note additional concerns and questions from Patient. ?Are there any health concerns that you feel we can do a better job addressing? No ?Note Patient's response. ?Are you having any problems with any of the following since the last visit: (select all that apply) ? None ? Details: ?12. Any falls since last visit? No ? Details: ?13. Any increased or uncontrolled pain since last visit? No ? Details: ?14. Next visit Type: office ?      Visit with:Dr. Henrene Pastor  ?       Date:08/27/21 ?       Time:7:45am ? ?15. Additional Details? No  ? ? ?Elray Mcgregor, CMA ?Clinical Pharmacist Assistant  ?[phone]  ?

## 2021-06-16 ENCOUNTER — Other Ambulatory Visit: Payer: Self-pay | Admitting: Family Medicine

## 2021-06-16 MED ORDER — LEVOFLOXACIN 500 MG PO TABS: ORAL_TABLET | ORAL | 1 refills | Status: DC

## 2021-07-04 ENCOUNTER — Other Ambulatory Visit: Payer: Self-pay | Admitting: Legal Medicine

## 2021-07-21 DIAGNOSIS — G894 Chronic pain syndrome: Secondary | ICD-10-CM | POA: Diagnosis not present

## 2021-07-21 DIAGNOSIS — Z79891 Long term (current) use of opiate analgesic: Secondary | ICD-10-CM | POA: Diagnosis not present

## 2021-07-21 DIAGNOSIS — M5459 Other low back pain: Secondary | ICD-10-CM | POA: Diagnosis not present

## 2021-08-11 ENCOUNTER — Telehealth: Payer: Self-pay

## 2021-08-11 NOTE — Progress Notes (Signed)
Chronic Care Management Pharmacy Assistant   Name: Brad Johnson  MRN: [id number] DOB: Apr 30, 1960   Reason for Encounter: General Adherence Call    Recent office visits:  None  Recent consult visits:  None  Hospital visits:  None  Medications: Outpatient Encounter Medications as of 08/11/2021  Medication Sig   ALPRAZolam (XANAX) 0.5 MG tablet TAKE 1 TABLET(0.5 MG) BY MOUTH DAILY AS NEEDED   aspirin EC 81 MG tablet Take 81 mg by mouth daily.   bethanechol (URECHOLINE) 25 MG tablet Take 25 mg by mouth daily.   celecoxib (CELEBREX) 200 MG capsule TAKE 1 CAPSULE(200 MG) BY MOUTH TWICE DAILY   docusate sodium (COLACE) 100 MG capsule Take 100 mg by mouth 2 (two) times daily.   furosemide (LASIX) 20 MG tablet TAKE 1 TABLET(20 MG) BY MOUTH EVERY DAY   furosemide (LASIX) 20 MG tablet TAKE 1 TABLET(20 MG) BY MOUTH EVERY DAY   gabapentin (NEURONTIN) 600 MG tablet TAKE 1 TABLET BY MOUTH THREE TIMES DAILY   glucose blood (ONETOUCH ULTRA) test strip Use as instructed   JARDIANCE 10 MG TABS tablet TAKE 1 TABLET BY MOUTH DAILY   levofloxacin (LEVAQUIN) 500 MG tablet TAKE 1 TABLET BY MOUTH DAILY FOR 5 DAYS WITH UROLIFT INFECTION   LINZESS 145 MCG CAPS capsule TAKE 1 CAPSULE BY MOUTH DAILY 30 MINUTES BEFORE FIRST MEAL OF THE DAY   lisinopril (ZESTRIL) 10 MG tablet TAKE 1 TABLET(10 MG) BY MOUTH DAILY   loratadine (CLARITIN) 10 MG tablet Take 10 mg by mouth daily as needed for allergies.   naloxone (NARCAN) 0.4 MG/ML injection Inject 1 mL (0.4 mg total) into the vein as needed.   Omega-3 Fatty Acids (FISH OIL) 1000 MG CAPS Take 1 capsule by mouth daily.   omeprazole (PRILOSEC) 20 MG capsule TAKE 1 CAPSULE BY MOUTH DAILY   oxyCODONE (ROXICODONE) 15 MG immediate release tablet Take 15 mg by mouth every 4 (four) hours as needed.   rosuvastatin (CRESTOR) 10 MG tablet TAKE 1 TABLET(10 MG) BY MOUTH EVERY DAY   Semaglutide,0.25 or 0.'5MG'$ /DOS, (OZEMPIC, 0.25 OR 0.5 MG/DOSE,) 2 MG/1.5ML SOPN Inject 0.5  mg into the skin once a week.   tamsulosin (FLOMAX) 0.4 MG CAPS capsule Take 0.4 mg by mouth daily.   No facility-administered encounter medications on file as of 08/11/2021.    [place] for General Review Call   Chart Review:  Have there been any documented new, changed, or discontinued medications since last visit? No  Has there been any documented recent hospitalizations or ED visits since last visit with Clinical Pharmacist? No Brief Summary (including medication and/or Diagnosis changes):   Adherence Review:  Does the Clinical Pharmacist Assistant have access to adherence rates? Yes Adherence rates for STAR metric medications (List medication(s)/day supply/ last 2 fill dates). Semaglutide 07/29/21 - 07/01/21 28ds Lisinopril 05/05/21-02/09/21 90ds Rosuvastatin 07/04/21- 04/09/21 90ds Jardiance 07/04/21 - 04/10/21 90ds Adherence rates for medications indicated for disease state being reviewed (List medication(s)/day supply/ last 2 fill dates). Does the patient have >5 day gap between last estimated fill dates for any of the above medications or other medication gaps? No Reason for medication gaps.   Disease State Questions:  Able to connect with Patient? Yes Did patient have any problems with their health recently? No Note problems and Concerns: Have you had any admissions or emergency room visits or worsening of your condition(s) since last visit? No Details of ED visit, hospital visit and/or worsening condition(s): Have you  had any visits with new specialists or providers since your last visit? No Explain: Have you had any new health care problem(s) since your last visit? No New problem(s) reported: Have you run out of any of your medications since you last spoke with clinical pharmacist? No What caused you to run out of your medications? Are there any medications you are not taking as prescribed? No What kept you from taking your medications as prescribed? Are you  having any issues or side effects with your medications? No Note of issues or side effects: Do you have any other health concerns or questions you want to discuss with your Clinical Pharmacist before your next visit? No Note additional concerns and questions from Patient. Are there any health concerns that you feel we can do a better job addressing? No Note Patient's response. Are you having any problems with any of the following since the last visit: (select all that apply)  None  Details: 12. Any falls since last visit? No  Details: 13. Any increased or uncontrolled pain since last visit? No  Details: 14. Next visit Type: office       Visit with:Dr. Henrene Pastor         Date:08/27/21        Time:7:25am  15. Additional Details? No   Elray Mcgregor, [facility] Cater[employer] manager  [phone]

## 2021-08-26 NOTE — Progress Notes (Signed)
Subjective:  Patient ID: Brad Johnson, male    DOB: 1961/01/09  Age: 61 y.o. MRN: [id number]  Chief Complaint  Patient presents with   Gastroesophageal Reflux   Diabetes   Hyperlipidemia    HPI: chronic  Patient present with type 2 diabetes. Compliance with treatment has been good; patient take medicines as directed, maintains diet and exercise regimen, follows up as directed, and is keeping glucose diary. Current medicines for diabetes Ozempic 0.5 mg weekly, Jardiance 10 mg daily. Patient performs foot exams daily and last ophthalmologic exam was few years ago.  Last A1C 6.7%.  Patient presents with hyperlipidemia.  Compliance with treatment has been good; patient takes medicines as directed, maintains low cholesterol diet, follows up as directed, and maintains exercise regimen.  Patient is using Rosuvastatin 10 mg daily, Fish Oil 1000 mg daily, without problems.   Patient presents for follow up of hypertension.  Patient tolerating Lisinopril 10 mg daily, Aspirin 81 mg daily. Patient is working on maintaining diet and exercise regimen and follows up as directed.   BPH: Taking Tamsulosin 0.4 mg daily. Current Outpatient Medications on File Prior to Visit  Medication Sig Dispense Refill   ALPRAZolam (XANAX) 0.5 MG tablet TAKE 1 TABLET(0.5 MG) BY MOUTH DAILY AS NEEDED 30 tablet 1   aspirin EC 81 MG tablet Take 81 mg by mouth daily.     bethanechol (URECHOLINE) 25 MG tablet Take 25 mg by mouth daily.     celecoxib (CELEBREX) 200 MG capsule TAKE 1 CAPSULE(200 MG) BY MOUTH TWICE DAILY 60 capsule 6   docusate sodium (COLACE) 100 MG capsule Take 100 mg by mouth 2 (two) times daily.     furosemide (LASIX) 20 MG tablet TAKE 1 TABLET(20 MG) BY MOUTH EVERY DAY 30 tablet 6   gabapentin (NEURONTIN) 600 MG tablet TAKE 1 TABLET BY MOUTH THREE TIMES DAILY 270 tablet 2   glucose blood (ONETOUCH ULTRA) test strip Use as instructed 100 each 12   JARDIANCE 10 MG TABS tablet TAKE 1 TABLET BY MOUTH DAILY  90 tablet 2   levofloxacin (LEVAQUIN) 500 MG tablet TAKE 1 TABLET BY MOUTH DAILY FOR 5 DAYS WITH UROLIFT INFECTION 30 tablet 1   LINZESS 145 MCG CAPS capsule TAKE 1 CAPSULE BY MOUTH DAILY 30 MINUTES BEFORE FIRST MEAL OF THE DAY 90 capsule 2   lisinopril (ZESTRIL) 10 MG tablet TAKE 1 TABLET(10 MG) BY MOUTH DAILY 90 tablet 2   loratadine (CLARITIN) 10 MG tablet Take 10 mg by mouth daily as needed for allergies.     naloxone (NARCAN) 0.4 MG/ML injection Inject 1 mL (0.4 mg total) into the vein as needed. 1 mL 3   Omega-3 Fatty Acids (FISH OIL) 1000 MG CAPS Take 1 capsule by mouth daily.     omeprazole (PRILOSEC) 20 MG capsule TAKE 1 CAPSULE BY MOUTH DAILY 90 capsule 2   oxyCODONE (ROXICODONE) 15 MG immediate release tablet Take 15 mg by mouth every 4 (four) hours as needed.     Semaglutide,0.25 or 0.'5MG'$ /DOS, (OZEMPIC, 0.25 OR 0.5 MG/DOSE,) 2 MG/1.5ML SOPN Inject 0.5 mg into the skin once a week. 3 mL 3   tamsulosin (FLOMAX) 0.4 MG CAPS capsule Take 0.4 mg by mouth daily.     No current facility-administered medications on file prior to visit.   Past Medical History:  Diagnosis Date   Arthritis    Depression    Hyperlipidemia    Prostate enlargement    Past Surgical History:  Procedure Laterality Date  CYSTOSCOPY WITH INSERTION OF UROLIFT     FRACTURE SURGERY     SPINE SURGERY     TOTAL HIP ARTHROPLASTY      History reviewed. No pertinent family history. Social History   Socioeconomic History   Marital status: Widowed    Spouse name: Not on file   Number of children: Not on file   Years of education: Not on file   Highest education level: Not on file  Occupational History   Not on file  Tobacco Use   Smoking status: Never   Smokeless tobacco: Never  Vaping Use   Vaping Use: Never used  Substance and Sexual Activity   Alcohol use: Never   Drug use: Never   Sexual activity: Not on file  Other Topics Concern   Not on file  Social History Narrative   Not on file   Social  Determinants of Health   Financial Resource Strain: Low Risk  (04/03/2021)   Overall Financial Resource Strain (CARDIA)    Difficulty of Paying Living Expenses: Not hard at all  Food Insecurity: No Food Insecurity (04/24/2020)   Hunger Vital Sign    Worried About Running Out of Food in the Last Year: Never true    [place] in the Last Year: Never true  Transportation Needs: No Transportation Needs (04/03/2021)   PRAPARE - Hydrologist (Medical): No    Lack of Transportation (Non-Medical): No  Physical Activity: Not on file  Stress: Not on file  Social Connections: Not on file    Review of Systems  Constitutional:  Negative for chills, fatigue, fever and unexpected weight change.  HENT:  Negative for congestion, ear pain, sinus pain and sore throat.   Eyes:  Negative for visual disturbance.  Respiratory:  Negative for apnea.   Cardiovascular:  Negative for chest pain and palpitations.  Gastrointestinal:  Negative for abdominal pain, blood in stool, constipation, diarrhea, nausea and vomiting.  Endocrine: Negative for polydipsia.  Genitourinary:  Negative for dysuria.  Musculoskeletal:  Negative for back pain.  Skin:  Negative for rash.  Neurological:  Negative for headaches.     Objective:  BP 128/80 (BP Location: Left Arm, Patient Position: Sitting, Cuff Size: Large)   Pulse 60   Temp (!) 97.2 F (36.2 C) (Temporal)   Ht 6' (1.829 m)   Wt 291 lb (132 kg)   SpO2 94%   BMI 39.47 kg/m      08/27/2021    7:34 AM 04/14/2021    7:25 AM 12/10/2020    7:29 AM  BP/Weight  Systolic BP 951 884 166  Diastolic BP 80 64 70  Wt. (Lbs) 291 314.4 315  BMI 39.47 kg/m2 42.64 kg/m2 42.72 kg/m2    Physical Exam Vitals reviewed.  Constitutional:      General: He is not in acute distress.    Appearance: Normal appearance. He is obese.  HENT:     Head: Normocephalic.     Right Ear: Tympanic membrane normal.     Left Ear: Tympanic membrane normal.      Nose: Nose normal.     Mouth/Throat:     Mouth: Mucous membranes are moist.     Pharynx: Oropharynx is clear.  Eyes:     Extraocular Movements: Extraocular movements intact.     Conjunctiva/sclera: Conjunctivae normal.     Pupils: Pupils are equal, round, and reactive to light.  Cardiovascular:     Rate and Rhythm: Normal rate  and regular rhythm.     Pulses: Normal pulses.     Heart sounds: Normal heart sounds. No murmur heard.    No gallop.  Pulmonary:     Effort: Pulmonary effort is normal. No respiratory distress.     Breath sounds: Normal breath sounds. No wheezing.  Abdominal:     General: Abdomen is flat. Bowel sounds are normal. There is no distension.     Palpations: Abdomen is soft.     Tenderness: There is no abdominal tenderness.  Musculoskeletal:        General: Normal range of motion.     Cervical back: Normal range of motion.     Right lower leg: No edema.     Left lower leg: No edema.  Skin:    General: Skin is warm.     Capillary Refill: Capillary refill takes less than 2 seconds.  Neurological:     General: No focal deficit present.     Mental Status: He is alert and oriented to person, place, and time. Mental status is at baseline.     Gait: Gait normal.  Psychiatric:        Mood and Affect: Mood normal.        Thought Content: Thought content normal.        Judgment: Judgment normal.     Diabetic Foot Exam - Simple   Simple Foot Form Diabetic Foot exam was performed with the following findings: Yes 08/27/2021  7:51 AM  Visual Inspection No deformities, no ulcerations, no other skin breakdown bilaterally: Yes Sensation Testing Intact to touch and monofilament testing bilaterally: Yes Pulse Check Posterior Tibialis and Dorsalis pulse intact bilaterally: Yes Comments      Lab Results  Component Value Date   WBC 2.9 (L) 08/27/2021   HGB 16.2 08/27/2021   HCT 46.3 08/27/2021   PLT 196 08/27/2021   GLUCOSE 96 08/27/2021   CHOL 252 (H) 08/27/2021    TRIG 92 08/27/2021   HDL 62 08/27/2021   LDLCALC 174 (H) 08/27/2021   ALT 24 08/27/2021   AST 23 08/27/2021   NA 139 08/27/2021   K 4.6 08/27/2021   CL 100 08/27/2021   CREATININE 0.81 08/27/2021   BUN 15 08/27/2021   CO2 23 08/27/2021   TSH 3.350 12/10/2020   INR 0.96 06/25/2009   HGBA1C 5.9 (H) 08/27/2021      04/14/2021    7:27 AM 09/04/2020   10:55 AM 11/17/2019    7:50 AM 07/18/2019    7:57 AM 06/08/2018   11:10 AM  Depression screen PHQ 2/9  Decreased Interest 0 0 0 3 0  Down, Depressed, Hopeless 0 0 0 0 1  PHQ - 2 Score 0 0 0 3 1  Altered sleeping   0 0   Tired, decreased energy   0 0   Change in appetite   0 0   Feeling bad or failure about yourself    0 0   Trouble concentrating   0 0   Moving slowly or fidgety/restless    0   Suicidal thoughts   0 0   PHQ-9 Score   0 3   Difficult doing work/chores   Not difficult at all Not difficult at all       Assessment & Plan:   Problem List Items Addressed This Visit       Cardiovascular and Mediastinum   Essential hypertension, benign - Primary   Relevant Orders   Comprehensive metabolic panel (Completed)  CBC with Differential/Platelet (Completed) An individual hypertension care plan was established and reinforced today.  The patient's status was assessed using clinical findings on exam and labs or diagnostic tests. The patient's success at meeting treatment goals on disease specific evidence-based guidelines and found to be well controlled. SELF MANAGEMENT: The patient and I together assessed ways to personally work towards obtaining the recommended goals. RECOMMENDATIONS: avoid decongestants found in common cold remedies, decrease consumption of alcohol, perform routine monitoring of BP with home BP cuff, exercise, reduction of dietary salt, take medicines as prescribed, try not to miss doses and quit smoking.  Regular exercise and maintaining a healthy weight is needed.  Stress reduction may help. A CLINICAL  SUMMARY including written plan identify barriers to care unique to individual due to social or financial issues.  We attempt to mutually creat solutions for individual and family understanding.      Digestive   Irritable bowel syndrome Stable    Esophageal reflux Plan of care was formulated today.  he is doing well.  A plan of care was formulated using patient exam, tests and other sources to optimize care using evidence based information.  Recommend no smoking, no eating after supper, avoid fatty foods, elevate Head of bed, avoid tight fitting clothing.  Continue on OTC.      Endocrine   Diabetic polyneuropathy (HCC) (Chronic)   Relevant Orders   Hemoglobin A1c (Completed) An individual care plan for diabetes was established and reinforced today.  The patient's status was assessed using clinical findings on exam, labs and diagnostic testing. Patient success at meeting goals based on disease specific evidence-based guidelines and found to be good controlled. Wearing diabetic shoes Medications were assessed and patient's understanding of the medical issues , including barriers were assessed. Recommend adherence to a diabetic diet, a graduated exercise program, HgbA1c level is checked quarterly, and urine microalbumin performed yearly .  Annual mono-filament sensation testing performed. Lower blood pressure and control hyperlipidemia is important. Get annual eye exams and annual flu shots and smoking cessation discussed.  Self management goals were discussed.      Genitourinary   Enlarged prostate with urinary obstruction   Relevant Orders   PSA (Completed) AN INDIVIDUAL CARE PLAN for BPH was established and reinforced today.  The patient's status was assessed using clinical findings on exam, labs, and other diagnostic testing. Patient's success at meeting treatment goals based on disease specific evidence-bassed guidelines and found to be in good control. RECOMMENDATIONS include maintaining  present medicines and treatment.      Other   Adjustment disorder with depressed mood (Chronic) Patient stable on no medicines    Illiteracy (Chronic) Patient has minimal ability to read or write, instructions repeated to patient    Mixed hyperlipidemia   Relevant Orders   Lipid panel (Completed) AN INDIVIDUAL CARE PLAN for hyperlipidemia/ cholesterol was established and reinforced today.  The patient's status was assessed using clinical findings on exam, lab and other diagnostic tests. The patient's disease status was assessed based on evidence-based guidelines and found to be fair controlled. MEDICATIONS were reviewed. SELF MANAGEMENT GOALS have been discussed and patient's success at attaining the goal of low cholesterol was assessed. RECOMMENDATION given include regular exercise 3 days a week and low cholesterol/low fat diet. CLINICAL SUMMARY including written plan to identify barriers unique to the patient due to social or economic  reasons was discussed.    BMI 39.0-39.9,adult An individualize plan was formulated for obesity using patient history and physical exam  to encourage weight loss.  An evidence based program was formulated.  Patient is to cut portion size with meals and to plan physical exercise 3 days a week at least 20 minutes.  Weight watchers and other programs are helpful.  Planned amount of weight loss 10 lbs.  Morbid obesity With BMI 39 and diabetes and hyperlipidemia, he meets criteria for morbid obesity   Other Visit Diagnoses     Screening for HIV (human immunodeficiency virus)       Relevant Orders   HIV antibody (with reflex) (Completed)   Need for hepatitis C screening test       Relevant Orders   Hepatitis C Antibody (Completed)     .    Orders Placed This Encounter  Procedures   Comprehensive metabolic panel   Hemoglobin A1c   Lipid panel   CBC with Differential/Platelet   PSA   HIV antibody (with reflex)   Hepatitis C Antibody    Cardiovascular Risk Assessment     Follow-up: Return in about 4 months (around 12/28/2021).  An After Visit Summary was printed and given to the patient.  Reinaldo Meeker, MD [facility] [phone]

## 2021-08-27 ENCOUNTER — Ambulatory Visit (INDEPENDENT_AMBULATORY_CARE_PROVIDER_SITE_OTHER): Payer: PPO | Admitting: Legal Medicine

## 2021-08-27 ENCOUNTER — Encounter: Payer: Self-pay | Admitting: Legal Medicine

## 2021-08-27 VITALS — BP 128/80 | HR 60 | Temp 97.2°F | Ht 72.0 in | Wt 291.0 lb

## 2021-08-27 DIAGNOSIS — K219 Gastro-esophageal reflux disease without esophagitis: Secondary | ICD-10-CM | POA: Diagnosis not present

## 2021-08-27 DIAGNOSIS — Z550 Illiteracy and low-level literacy: Secondary | ICD-10-CM

## 2021-08-27 DIAGNOSIS — N401 Enlarged prostate with lower urinary tract symptoms: Secondary | ICD-10-CM

## 2021-08-27 DIAGNOSIS — K588 Other irritable bowel syndrome: Secondary | ICD-10-CM

## 2021-08-27 DIAGNOSIS — Z1159 Encounter for screening for other viral diseases: Secondary | ICD-10-CM | POA: Diagnosis not present

## 2021-08-27 DIAGNOSIS — E782 Mixed hyperlipidemia: Secondary | ICD-10-CM

## 2021-08-27 DIAGNOSIS — F4321 Adjustment disorder with depressed mood: Secondary | ICD-10-CM

## 2021-08-27 DIAGNOSIS — Z114 Encounter for screening for human immunodeficiency virus [HIV]: Secondary | ICD-10-CM | POA: Diagnosis not present

## 2021-08-27 DIAGNOSIS — I10 Essential (primary) hypertension: Secondary | ICD-10-CM

## 2021-08-27 DIAGNOSIS — E1142 Type 2 diabetes mellitus with diabetic polyneuropathy: Secondary | ICD-10-CM | POA: Diagnosis not present

## 2021-08-27 DIAGNOSIS — Z6839 Body mass index (BMI) 39.0-39.9, adult: Secondary | ICD-10-CM

## 2021-08-27 DIAGNOSIS — N138 Other obstructive and reflux uropathy: Secondary | ICD-10-CM

## 2021-08-27 HISTORY — DX: Body mass index (BMI) 39.0-39.9, adult: Z68.39

## 2021-08-28 ENCOUNTER — Other Ambulatory Visit: Payer: Self-pay | Admitting: Legal Medicine

## 2021-08-28 DIAGNOSIS — E782 Mixed hyperlipidemia: Secondary | ICD-10-CM

## 2021-08-28 LAB — COMPREHENSIVE METABOLIC PANEL
ALT: 24 IU/L (ref 0–44)
AST: 23 IU/L (ref 0–40)
Albumin/Globulin Ratio: 2 (ref 1.2–2.2)
Albumin: 4.7 g/dL (ref 3.9–4.9)
Alkaline Phosphatase: 76 IU/L (ref 44–121)
BUN/Creatinine Ratio: 19 (ref 10–24)
BUN: 15 mg/dL (ref 8–27)
Bilirubin Total: 0.6 mg/dL (ref 0.0–1.2)
CO2: 23 mmol/L (ref 20–29)
Calcium: 9.4 mg/dL (ref 8.6–10.2)
Chloride: 100 mmol/L (ref 96–106)
Creatinine, Ser: 0.81 mg/dL (ref 0.76–1.27)
Globulin, Total: 2.4 g/dL (ref 1.5–4.5)
Glucose: 96 mg/dL (ref 70–99)
Potassium: 4.6 mmol/L (ref 3.5–5.2)
Sodium: 139 mmol/L (ref 134–144)
Total Protein: 7.1 g/dL (ref 6.0–8.5)
eGFR: 100 mL/min/{1.73_m2} (ref >59)

## 2021-08-28 LAB — LIPID PANEL
Chol/HDL Ratio: 4.1 ratio (ref 0.0–5.0)
Cholesterol, Total: 252 mg/dL — ABNORMAL HIGH (ref 100–199)
HDL: 62 mg/dL (ref >39)
LDL Chol Calc (NIH): 174 mg/dL — ABNORMAL HIGH (ref 0–99)
Triglycerides: 92 mg/dL (ref 0–149)
VLDL Cholesterol Cal: 16 mg/dL (ref 5–40)

## 2021-08-28 LAB — CBC WITH DIFFERENTIAL/PLATELET
Basophils Absolute: 0.1 10*3/uL (ref 0.0–0.2)
Basos: 2 %
EOS (ABSOLUTE): 0.1 10*3/uL (ref 0.0–0.4)
Eos: 4 %
Hematocrit: 46.3 % (ref 37.5–51.0)
Hemoglobin: 16.2 g/dL (ref 13.0–17.7)
Immature Grans (Abs): 0 10*3/uL (ref 0.0–0.1)
Immature Granulocytes: 0 %
Lymphocytes Absolute: 1.4 10*3/uL (ref 0.7–3.1)
Lymphs: 49 %
MCH: 32.4 pg (ref 26.6–33.0)
MCHC: 35 g/dL (ref 31.5–35.7)
MCV: 93 fL (ref 79–97)
Monocytes Absolute: 0.3 10*3/uL (ref 0.1–0.9)
Monocytes: 10 %
Neutrophils Absolute: 1 10*3/uL — ABNORMAL LOW (ref 1.4–7.0)
Neutrophils: 35 %
Platelets: 196 10*3/uL (ref 150–450)
RBC: 5 x10E6/uL (ref 4.14–5.80)
RDW: 13.2 % (ref 11.6–15.4)
WBC: 2.9 10*3/uL — ABNORMAL LOW (ref 3.4–10.8)

## 2021-08-28 LAB — HIV ANTIBODY (ROUTINE TESTING W REFLEX): HIV Screen 4th Generation wRfx: NONREACTIVE

## 2021-08-28 LAB — CARDIOVASCULAR RISK ASSESSMENT

## 2021-08-28 LAB — HEMOGLOBIN A1C
Est. average glucose Bld gHb Est-mCnc: 123 mg/dL
Hgb A1c MFr Bld: 5.9 % — ABNORMAL HIGH (ref 4.8–5.6)

## 2021-08-28 LAB — HEPATITIS C ANTIBODY: Hep C Virus Ab: NONREACTIVE

## 2021-08-28 LAB — PSA: Prostate Specific Ag, Serum: 0.2 ng/mL (ref 0.0–4.0)

## 2021-08-28 MED ORDER — ROSUVASTATIN CALCIUM 20 MG PO TABS: 20.0000 mg | ORAL_TABLET | Freq: Every day | ORAL | 2 refills | Status: DC

## 2021-08-28 NOTE — Progress Notes (Signed)
A1c 5.9 good, LDL cholesterol 174 very high increase crestor to '20mg'$  each day, wbc no changes, HIV negative, Hepatitis c negative, kidney and liver tests normal, PSA 0.2 normal lp

## 2021-09-10 ENCOUNTER — Telehealth: Payer: Self-pay

## 2021-09-10 NOTE — Progress Notes (Signed)
Care Gap(s) Not Met that Need to be Addressed:   Colorectal Cancer Screening  Eye Exam for Patients With Diabetes   Action Taken: Message provider to order and schedule the pt for these. Pt has never had these done   Follow Up: 01/06/22 with Dr. Tobie Poet

## 2021-09-21 ENCOUNTER — Other Ambulatory Visit: Payer: Self-pay | Admitting: Legal Medicine

## 2021-09-22 ENCOUNTER — Other Ambulatory Visit: Payer: Self-pay

## 2021-09-22 ENCOUNTER — Other Ambulatory Visit: Payer: Self-pay | Admitting: Legal Medicine

## 2021-09-22 DIAGNOSIS — E1142 Type 2 diabetes mellitus with diabetic polyneuropathy: Secondary | ICD-10-CM

## 2021-09-28 ENCOUNTER — Other Ambulatory Visit: Payer: Self-pay | Admitting: Legal Medicine

## 2021-10-03 ENCOUNTER — Other Ambulatory Visit: Payer: Self-pay

## 2021-10-03 DIAGNOSIS — Z1211 Encounter for screening for malignant neoplasm of colon: Secondary | ICD-10-CM

## 2021-11-12 DIAGNOSIS — E119 Type 2 diabetes mellitus without complications: Secondary | ICD-10-CM | POA: Diagnosis not present

## 2021-11-12 LAB — HM DIABETES EYE EXAM

## 2021-11-15 ENCOUNTER — Other Ambulatory Visit: Payer: Self-pay | Admitting: Legal Medicine

## 2021-11-15 DIAGNOSIS — K5909 Other constipation: Secondary | ICD-10-CM

## 2021-11-22 DIAGNOSIS — M5459 Other low back pain: Secondary | ICD-10-CM | POA: Diagnosis not present

## 2021-11-22 DIAGNOSIS — G894 Chronic pain syndrome: Secondary | ICD-10-CM | POA: Diagnosis not present

## 2021-11-22 DIAGNOSIS — Z79891 Long term (current) use of opiate analgesic: Secondary | ICD-10-CM | POA: Diagnosis not present

## 2021-11-27 ENCOUNTER — Other Ambulatory Visit: Payer: Self-pay

## 2021-11-27 MED ORDER — JARDIANCE 10 MG PO TABS: 10.0000 mg | ORAL_TABLET | Freq: Every day | ORAL | 2 refills | Status: DC

## 2021-12-17 DIAGNOSIS — M1712 Unilateral primary osteoarthritis, left knee: Secondary | ICD-10-CM | POA: Diagnosis not present

## 2022-01-06 ENCOUNTER — Ambulatory Visit: Payer: PPO | Admitting: Family Medicine

## 2022-02-16 ENCOUNTER — Other Ambulatory Visit: Payer: Self-pay

## 2022-02-16 DIAGNOSIS — I10 Essential (primary) hypertension: Secondary | ICD-10-CM

## 2022-02-16 MED ORDER — LISINOPRIL 10 MG PO TABS: ORAL_TABLET | ORAL | 0 refills | Status: DC

## 2022-03-07 ENCOUNTER — Other Ambulatory Visit: Payer: Self-pay | Admitting: Family Medicine

## 2022-03-09 ENCOUNTER — Other Ambulatory Visit: Payer: Self-pay

## 2022-03-10 ENCOUNTER — Other Ambulatory Visit: Payer: Self-pay

## 2022-03-10 NOTE — Telephone Encounter (Signed)
Appointment has been made

## 2022-03-11 MED ORDER — ALPRAZOLAM 0.5 MG PO TABS: ORAL_TABLET | ORAL | 1 refills | Status: DC

## 2022-03-16 ENCOUNTER — Other Ambulatory Visit: Payer: Self-pay

## 2022-03-16 DIAGNOSIS — G629 Polyneuropathy, unspecified: Secondary | ICD-10-CM

## 2022-03-16 MED ORDER — GABAPENTIN 600 MG PO TABS: 600.0000 mg | ORAL_TABLET | Freq: Three times a day (TID) | ORAL | 0 refills | Status: DC

## 2022-03-25 DIAGNOSIS — M5459 Other low back pain: Secondary | ICD-10-CM | POA: Diagnosis not present

## 2022-03-25 DIAGNOSIS — Z5181 Encounter for therapeutic drug level monitoring: Secondary | ICD-10-CM | POA: Diagnosis not present

## 2022-03-25 DIAGNOSIS — Z79891 Long term (current) use of opiate analgesic: Secondary | ICD-10-CM | POA: Diagnosis not present

## 2022-03-25 DIAGNOSIS — M5416 Radiculopathy, lumbar region: Secondary | ICD-10-CM | POA: Diagnosis not present

## 2022-03-25 DIAGNOSIS — G894 Chronic pain syndrome: Secondary | ICD-10-CM | POA: Diagnosis not present

## 2022-03-25 DIAGNOSIS — Z79899 Other long term (current) drug therapy: Secondary | ICD-10-CM | POA: Diagnosis not present

## 2022-03-27 DIAGNOSIS — M1712 Unilateral primary osteoarthritis, left knee: Secondary | ICD-10-CM | POA: Diagnosis not present

## 2022-04-03 DIAGNOSIS — M1712 Unilateral primary osteoarthritis, left knee: Secondary | ICD-10-CM | POA: Diagnosis not present

## 2022-04-10 DIAGNOSIS — M1712 Unilateral primary osteoarthritis, left knee: Secondary | ICD-10-CM | POA: Diagnosis not present

## 2022-04-14 ENCOUNTER — Other Ambulatory Visit: Payer: Self-pay

## 2022-04-14 MED ORDER — CELECOXIB 200 MG PO CAPS: ORAL_CAPSULE | ORAL | 0 refills | Status: DC

## 2022-04-19 ENCOUNTER — Other Ambulatory Visit: Payer: Self-pay | Admitting: Family Medicine

## 2022-04-20 ENCOUNTER — Other Ambulatory Visit: Payer: Self-pay

## 2022-04-20 DIAGNOSIS — E1142 Type 2 diabetes mellitus with diabetic polyneuropathy: Secondary | ICD-10-CM

## 2022-04-20 MED ORDER — OZEMPIC (0.25 OR 0.5 MG/DOSE) 2 MG/3ML ~~LOC~~ SOPN: PEN_INJECTOR | SUBCUTANEOUS | 0 refills | Status: DC

## 2022-04-28 NOTE — Assessment & Plan Note (Signed)
Control:  Recommend check sugars fasting daily. Recommend check feet daily. Recommend annual eye exams. Medicines: Jardiance 10 mg daily, Ozempic 0.5 mg weekly Continue to work on eating a healthy diet and exercise.  Labs drawn today.

## 2022-04-28 NOTE — Assessment & Plan Note (Signed)
The current medical regimen is effective;  continue present plan and medications.  Tamsulosin 0.4 mg daily 

## 2022-04-28 NOTE — Assessment & Plan Note (Signed)
The current medical regimen is effective; continue present plan and medications. Omeprazole 20 mg daily. 

## 2022-04-28 NOTE — Assessment & Plan Note (Signed)
Well controlled.  No changes to medicines. Taking Rosuvastatin 20 mg daily, fish oil Continue to work on eating a healthy diet and exercise.  Labs drawn today.

## 2022-04-28 NOTE — Progress Notes (Unsigned)
Subjective:  Patient ID: Brad Johnson, male    DOB: 03-Dec-1960  Age: 62 y.o. MRN: [id number]  Chief Complaint  Patient presents with   Diabetes   Hypertension   Hyperlipidemia    HPI   Diabetes:  Complications: Glucose checking: Glucose logs: Hypoglycemia: Most recent A1C: 5.9% Current medications: Jardiance 10 mg daily, Ozempic 0.5 weekly. Last Eye Exam: Foot checks:  Hyperlipidemia: Current medications: Rosuvastatin 20 mg daily, Fish Oil.  Hypertension: Complications: Current medications: Lisinopril 10 mg daily, Aspirin 81 mg daily.  Diet: Exercise:      04/14/2021    7:27 AM 09/04/2020   10:55 AM 11/17/2019    7:50 AM 07/18/2019    7:57 AM 06/08/2018   11:10 AM  Depression screen PHQ 2/9  Decreased Interest 0 0 0 3 0  Down, Depressed, Hopeless 0 0 0 0 1  PHQ - 2 Score 0 0 0 3 1  Altered sleeping   0 0   Tired, decreased energy   0 0   Change in appetite   0 0   Feeling bad or failure about yourself    0 0   Trouble concentrating   0 0   Moving slowly or fidgety/restless    0   Suicidal thoughts   0 0   PHQ-9 Score   0 3   Difficult doing work/chores   Not difficult at all Not difficult at all          06/08/2018   10:42 AM 07/18/2019    7:40 AM 04/14/2021    7:27 AM  Fall Risk  Falls in the past year? 0 0 0  Was there an injury with Fall?  0 0  Fall Risk Category Calculator  0 0  Fall Risk Category (Retired)  Low Low  (RETIRED) Patient Fall Risk Level  Low fall risk Low fall risk  Fall risk Follow up Falls evaluation completed;Falls prevention discussed;Education provided Falls evaluation completed       Review of Systems  Current Outpatient Medications on File Prior to Visit  Medication Sig Dispense Refill   ALPRAZolam (XANAX) 0.5 MG tablet TAKE 1 TABLET(0.5 MG) BY MOUTH DAILY AS NEEDED 30 tablet 2   aspirin EC 81 MG tablet Take 81 mg by mouth daily.     bethanechol (URECHOLINE) 25 MG tablet Take 25 mg by mouth daily.     celecoxib (CELEBREX)  200 MG capsule TAKE 1 CAPSULE(200 MG) BY MOUTH TWICE DAILY 60 capsule 0   docusate sodium (COLACE) 100 MG capsule Take 100 mg by mouth 2 (two) times daily.     furosemide (LASIX) 20 MG tablet TAKE 1 TABLET(20 MG) BY MOUTH EVERY DAY 30 tablet 6   gabapentin (NEURONTIN) 600 MG tablet Take 1 tablet (600 mg total) by mouth 3 (three) times daily. 270 tablet 0   glucose blood (ONETOUCH ULTRA) test strip Use as instructed 100 each 12   JARDIANCE 10 MG TABS tablet Take 1 tablet (10 mg total) by mouth daily. 90 tablet 2   levofloxacin (LEVAQUIN) 500 MG tablet TAKE 1 TABLET BY MOUTH DAILY FOR 5 DAYS WITH UROLIFT INFECTION 30 tablet 1   LINZESS 145 MCG CAPS capsule TAKE 1 CAPSULE BY MOUTH DAILY 30 MINUTES BEFORE FIRST MEAL OF THE DAY 90 capsule 2   lisinopril (ZESTRIL) 10 MG tablet TAKE 1 TABLET(10 MG) BY MOUTH DAILY 90 tablet 0   loratadine (CLARITIN) 10 MG tablet Take 10 mg by mouth daily as needed for allergies.  naloxone (NARCAN) 0.4 MG/ML injection Inject 1 mL (0.4 mg total) into the vein as needed. 1 mL 3   Omega-3 Fatty Acids (FISH OIL) 1000 MG CAPS Take 1 capsule by mouth daily.     omeprazole (PRILOSEC) 20 MG capsule TAKE 1 CAPSULE BY MOUTH DAILY 90 capsule 2   oxyCODONE (ROXICODONE) 15 MG immediate release tablet Take 15 mg by mouth every 4 (four) hours as needed.     rosuvastatin (CRESTOR) 20 MG tablet Take 1 tablet (20 mg total) by mouth daily. 90 tablet 2   Semaglutide,0.25 or 0.5MG /DOS, (OZEMPIC, 0.25 OR 0.5 MG/DOSE,) 2 MG/3ML SOPN INJECT 0.5 MG UNDER THE SKIN ONCE WEEKLY 3 mL 0   tamsulosin (FLOMAX) 0.4 MG CAPS capsule Take 0.4 mg by mouth daily.     No current facility-administered medications on file prior to visit.   Past Medical History:  Diagnosis Date   Arthritis    Depression    Hyperlipidemia    Prostate enlargement    Past Surgical History:  Procedure Laterality Date   CYSTOSCOPY WITH INSERTION OF UROLIFT     FRACTURE SURGERY     SPINE SURGERY     TOTAL HIP  ARTHROPLASTY      No family history on file. Social History   Socioeconomic History   Marital status: Widowed    Spouse name: Not on file   Number of children: Not on file   Years of education: Not on file   Highest education level: Not on file  Occupational History   Not on file  Tobacco Use   Smoking status: Never   Smokeless tobacco: Never  Vaping Use   Vaping Use: Never used  Substance and Sexual Activity   Alcohol use: Never   Drug use: Never   Sexual activity: Not on file  Other Topics Concern   Not on file  Social History Narrative   Not on file   Social Determinants of Health   Financial Resource Strain: Low Risk  (04/03/2021)   Overall Financial Resource Strain (CARDIA)    Difficulty of Paying Living Expenses: Not hard at all  Food Insecurity: No Food Insecurity (04/24/2020)   Hunger Vital Sign    Worried About Running Out of Food in the Last Year: Never true    [place] in the Last Year: Never true  Transportation Needs: No Transportation Needs (04/03/2021)   PRAPARE - Hydrologist (Medical): No    Lack of Transportation (Non-Medical): No  Physical Activity: Not on file  Stress: Not on file  Social Connections: Not on file    Objective:  There were no vitals taken for this visit.     08/27/2021    7:34 AM 04/14/2021    7:25 AM 12/10/2020    7:29 AM  BP/Weight  Systolic BP 0000000 [id number]  Diastolic BP 80 64 70  Wt. (Lbs) 291 314.4 315  BMI 39.47 kg/m2 42.64 kg/m2 42.72 kg/m2    Physical Exam  Diabetic Foot Exam - Simple   No data filed      Lab Results  Component Value Date   WBC 2.9 (L) 08/27/2021   HGB 16.2 08/27/2021   HCT 46.3 08/27/2021   PLT 196 08/27/2021   GLUCOSE 96 08/27/2021   CHOL 252 (H) 08/27/2021   TRIG 92 08/27/2021   HDL 62 08/27/2021   LDLCALC 174 (H) 08/27/2021   ALT 24 08/27/2021   AST 23 08/27/2021   NA 139  08/27/2021   K 4.6 08/27/2021   CL 100 08/27/2021   CREATININE 0.81  08/27/2021   BUN 15 08/27/2021   CO2 23 08/27/2021   TSH 3.350 12/10/2020   INR 0.96 06/25/2009   HGBA1C 5.9 (H) 08/27/2021      Assessment & Plan:    Essential hypertension, benign Assessment & Plan: Well controlled.  No changes to medicines. Lisinopril 10 mg daily, Aspirin 81 mg daily, Continue to work on eating a healthy diet and exercise.  Labs drawn today.     Gastroesophageal reflux disease without esophagitis Assessment & Plan: The current medical regimen is effective;  continue present plan and medications.  Omeprazole 20 mg daily.   Diabetic polyneuropathy associated with type 2 diabetes mellitus ([place]) Assessment & Plan: Control:  Recommend check sugars fasting daily. Recommend check feet daily. Recommend annual eye exams. Medicines: Jardiance 10 mg daily, Ozempic 0.5 mg weekly Continue to work on eating a healthy diet and exercise.  Labs drawn today.      Enlarged prostate with urinary obstruction Assessment & Plan: The current medical regimen is effective;  continue present plan and medications.  Tamsulosin 0.4 mg daily.   Mixed hyperlipidemia Assessment & Plan: Well controlled.  No changes to medicines. Taking Rosuvastatin 20 mg daily, fish oil Continue to work on eating a healthy diet and exercise.  Labs drawn today.     Illiteracy     No orders of the defined types were placed in this encounter.   No orders of the defined types were placed in this encounter.    Follow-up: No follow-ups on file.   I,Marla I Leal-Borjas,acting as a scribe for Rochel Brome, MD.,have documented all relevant documentation on the behalf of Rochel Brome, MD,as directed by  Rochel Brome, MD while in the presence of Rochel Brome, MD.   An After Visit Summary was printed and given to the patient.  Rochel Brome, MD [facility] [phone]

## 2022-04-28 NOTE — Assessment & Plan Note (Signed)
Well controlled.  No changes to medicines. Lisinopril 10 mg daily, Aspirin 81 mg daily, Continue to work on eating a healthy diet and exercise.  Labs drawn today.

## 2022-04-29 ENCOUNTER — Ambulatory Visit (INDEPENDENT_AMBULATORY_CARE_PROVIDER_SITE_OTHER): Payer: PPO | Admitting: Family Medicine

## 2022-04-29 ENCOUNTER — Encounter: Payer: Self-pay | Admitting: Family Medicine

## 2022-04-29 VITALS — BP 110/60 | HR 56 | Temp 96.9°F | Resp 16 | Ht 72.0 in | Wt 304.0 lb

## 2022-04-29 DIAGNOSIS — N401 Enlarged prostate with lower urinary tract symptoms: Secondary | ICD-10-CM

## 2022-04-29 DIAGNOSIS — N138 Other obstructive and reflux uropathy: Secondary | ICD-10-CM | POA: Diagnosis not present

## 2022-04-29 DIAGNOSIS — Z1211 Encounter for screening for malignant neoplasm of colon: Secondary | ICD-10-CM | POA: Diagnosis not present

## 2022-04-29 DIAGNOSIS — Z23 Encounter for immunization: Secondary | ICD-10-CM

## 2022-04-29 DIAGNOSIS — E782 Mixed hyperlipidemia: Secondary | ICD-10-CM

## 2022-04-29 DIAGNOSIS — I10 Essential (primary) hypertension: Secondary | ICD-10-CM | POA: Diagnosis not present

## 2022-04-29 DIAGNOSIS — K219 Gastro-esophageal reflux disease without esophagitis: Secondary | ICD-10-CM

## 2022-04-29 DIAGNOSIS — E1142 Type 2 diabetes mellitus with diabetic polyneuropathy: Secondary | ICD-10-CM | POA: Diagnosis not present

## 2022-04-29 DIAGNOSIS — Z550 Illiteracy and low-level literacy: Secondary | ICD-10-CM | POA: Diagnosis not present

## 2022-04-29 MED ORDER — LEVOFLOXACIN 500 MG PO TABS: ORAL_TABLET | ORAL | 0 refills | Status: DC

## 2022-04-29 NOTE — Addendum Note (Signed)
Addended by: Effie Shy on: 04/29/2022 09:49 AM   Modules accepted: Orders

## 2022-04-29 NOTE — Patient Instructions (Signed)
Recommend get shingles vaccine series and tetanus (TDAP) at your pharmacy.

## 2022-04-30 LAB — COMPREHENSIVE METABOLIC PANEL
ALT: 29 IU/L (ref 0–44)
AST: 32 IU/L (ref 0–40)
Albumin/Globulin Ratio: 1.8 (ref 1.2–2.2)
Albumin: 4.6 g/dL (ref 3.9–4.9)
Alkaline Phosphatase: 83 IU/L (ref 44–121)
BUN/Creatinine Ratio: 21 (ref 10–24)
BUN: 16 mg/dL (ref 8–27)
Bilirubin Total: 0.6 mg/dL (ref 0.0–1.2)
CO2: 22 mmol/L (ref 20–29)
Calcium: 9.3 mg/dL (ref 8.6–10.2)
Chloride: 101 mmol/L (ref 96–106)
Creatinine, Ser: 0.78 mg/dL (ref 0.76–1.27)
Globulin, Total: 2.5 g/dL (ref 1.5–4.5)
Glucose: 97 mg/dL (ref 70–99)
Potassium: 4.6 mmol/L (ref 3.5–5.2)
Sodium: 140 mmol/L (ref 134–144)
Total Protein: 7.1 g/dL (ref 6.0–8.5)
eGFR: 101 mL/min/{1.73_m2} (ref >59)

## 2022-04-30 LAB — TSH: TSH: 1.7 u[IU]/mL (ref 0.450–4.500)

## 2022-04-30 LAB — CBC WITH DIFFERENTIAL/PLATELET
Basophils Absolute: 0.1 10*3/uL (ref 0.0–0.2)
Basos: 2 %
EOS (ABSOLUTE): 0.1 10*3/uL (ref 0.0–0.4)
Eos: 4 %
Hematocrit: 44.3 % (ref 37.5–51.0)
Hemoglobin: 15.3 g/dL (ref 13.0–17.7)
Immature Grans (Abs): 0 10*3/uL (ref 0.0–0.1)
Immature Granulocytes: 0 %
Lymphocytes Absolute: 1.7 10*3/uL (ref 0.7–3.1)
Lymphs: 53 %
MCH: 31.9 pg (ref 26.6–33.0)
MCHC: 34.5 g/dL (ref 31.5–35.7)
MCV: 93 fL (ref 79–97)
Monocytes Absolute: 0.3 10*3/uL (ref 0.1–0.9)
Monocytes: 10 %
Neutrophils Absolute: 1 10*3/uL — ABNORMAL LOW (ref 1.4–7.0)
Neutrophils: 31 %
Platelets: 183 10*3/uL (ref 150–450)
RBC: 4.79 x10E6/uL (ref 4.14–5.80)
RDW: 12.5 % (ref 11.6–15.4)
WBC: 3.1 10*3/uL — ABNORMAL LOW (ref 3.4–10.8)

## 2022-04-30 LAB — MICROALBUMIN / CREATININE URINE RATIO
Creatinine, Urine: 163.9 mg/dL
Microalb/Creat Ratio: 6 mg/g creat (ref 0–29)
Microalbumin, Urine: 10.4 ug/mL

## 2022-04-30 LAB — LIPID PANEL
Chol/HDL Ratio: 3.1 ratio (ref 0.0–5.0)
Cholesterol, Total: 178 mg/dL (ref 100–199)
HDL: 58 mg/dL (ref >39)
LDL Chol Calc (NIH): 105 mg/dL — ABNORMAL HIGH (ref 0–99)
Triglycerides: 80 mg/dL (ref 0–149)
VLDL Cholesterol Cal: 15 mg/dL (ref 5–40)

## 2022-04-30 LAB — HEMOGLOBIN A1C
Est. average glucose Bld gHb Est-mCnc: 126 mg/dL
Hgb A1c MFr Bld: 6 % — ABNORMAL HIGH (ref 4.8–5.6)

## 2022-04-30 LAB — CARDIOVASCULAR RISK ASSESSMENT

## 2022-04-30 NOTE — Progress Notes (Signed)
Blood count abnormal. Wbc little low, but stable.  Liver function normal.  Kidney function normal.  Thyroid function normal.  Cholesterol: LDL greatly improved. LDL 105. Not at goal of less than 55. Recommend add zetia 10 mg daily. Continue crestor 10 mg daily.  HBA1C: 6.0. Spilling protein in urine.

## 2022-05-01 ENCOUNTER — Other Ambulatory Visit: Payer: Self-pay

## 2022-05-01 MED ORDER — EZETIMIBE 10 MG PO TABS: 10.0000 mg | ORAL_TABLET | Freq: Every day | ORAL | 0 refills | Status: DC

## 2022-05-04 ENCOUNTER — Other Ambulatory Visit: Payer: Self-pay

## 2022-05-04 MED ORDER — DAPAGLIFLOZIN PROPANEDIOL 5 MG PO TABS: 5.0000 mg | ORAL_TABLET | Freq: Every day | ORAL | 1 refills | Status: DC

## 2022-05-07 DIAGNOSIS — Z79899 Other long term (current) drug therapy: Secondary | ICD-10-CM | POA: Diagnosis not present

## 2022-05-07 DIAGNOSIS — N401 Enlarged prostate with lower urinary tract symptoms: Secondary | ICD-10-CM | POA: Diagnosis not present

## 2022-05-07 DIAGNOSIS — R338 Other retention of urine: Secondary | ICD-10-CM | POA: Diagnosis not present

## 2022-05-07 DIAGNOSIS — N411 Chronic prostatitis: Secondary | ICD-10-CM | POA: Diagnosis not present

## 2022-05-07 DIAGNOSIS — R339 Retention of urine, unspecified: Secondary | ICD-10-CM | POA: Diagnosis not present

## 2022-05-14 ENCOUNTER — Other Ambulatory Visit: Payer: Self-pay | Admitting: Family Medicine

## 2022-05-14 DIAGNOSIS — E782 Mixed hyperlipidemia: Secondary | ICD-10-CM

## 2022-05-14 DIAGNOSIS — I10 Essential (primary) hypertension: Secondary | ICD-10-CM

## 2022-05-14 MED ORDER — ROSUVASTATIN CALCIUM 20 MG PO TABS: 20.0000 mg | ORAL_TABLET | Freq: Every day | ORAL | 1 refills | Status: DC

## 2022-05-18 ENCOUNTER — Other Ambulatory Visit: Payer: Self-pay

## 2022-05-18 DIAGNOSIS — E1142 Type 2 diabetes mellitus with diabetic polyneuropathy: Secondary | ICD-10-CM

## 2022-05-18 DIAGNOSIS — M1712 Unilateral primary osteoarthritis, left knee: Secondary | ICD-10-CM | POA: Diagnosis not present

## 2022-05-18 MED ORDER — OZEMPIC (0.25 OR 0.5 MG/DOSE) 2 MG/3ML ~~LOC~~ SOPN: PEN_INJECTOR | SUBCUTANEOUS | 0 refills | Status: DC

## 2022-06-07 ENCOUNTER — Other Ambulatory Visit: Payer: Self-pay | Admitting: Family Medicine

## 2022-06-07 DIAGNOSIS — G629 Polyneuropathy, unspecified: Secondary | ICD-10-CM

## 2022-06-13 ENCOUNTER — Other Ambulatory Visit: Payer: Self-pay | Admitting: Family Medicine

## 2022-06-25 DIAGNOSIS — N401 Enlarged prostate with lower urinary tract symptoms: Secondary | ICD-10-CM | POA: Diagnosis not present

## 2022-06-25 DIAGNOSIS — R339 Retention of urine, unspecified: Secondary | ICD-10-CM | POA: Diagnosis not present

## 2022-06-25 DIAGNOSIS — R338 Other retention of urine: Secondary | ICD-10-CM | POA: Diagnosis not present

## 2022-06-30 ENCOUNTER — Ambulatory Visit (INDEPENDENT_AMBULATORY_CARE_PROVIDER_SITE_OTHER): Payer: PPO

## 2022-06-30 VITALS — BP 120/78 | HR 54 | Resp 16 | Ht 72.0 in | Wt 305.0 lb

## 2022-06-30 DIAGNOSIS — Z0000 Encounter for general adult medical examination without abnormal findings: Secondary | ICD-10-CM | POA: Diagnosis not present

## 2022-06-30 NOTE — Patient Instructions (Signed)
Mr. Brad Johnson , Thank you for taking time to come for your Medicare Wellness Visit. I appreciate your ongoing commitment to your health goals. Please review the following plan we discussed and let me know if I can assist you in the future.   Screening recommendations/referrals: Colonoscopy: Due - call Dr Jennye Boroughs to schedule Recommended yearly ophthalmology/optometry visit for glaucoma screening and checkup Recommended yearly dental visit for hygiene and checkup  Vaccinations: Influenza vaccine: Due yearly in the fall Tdap vaccine: Due - you can get this at the pharmacy Shingles vaccine: Due - you can get this at the pharmacy     Preventive Care 40-64 Years, Male Preventive care refers to lifestyle choices and visits with your health care provider that can promote health and wellness. What does preventive care include? A yearly physical exam. This is also called an annual well check. Dental exams once or twice a year. Routine eye exams. Ask your health care provider how often you should have your eyes checked. Personal lifestyle choices, including: Daily care of your teeth and gums. Regular physical activity. Eating a healthy diet. Avoiding tobacco and drug use. Limiting alcohol use. Practicing safe sex. Taking low-dose aspirin every day starting at age 45. What happens during an annual well check? The services and screenings done by your health care provider during your annual well check will depend on your age, overall health, lifestyle risk factors, and family history of disease. Counseling  Your health care provider may ask you questions about your: Alcohol use. Tobacco use. Drug use. Emotional well-being. Home and relationship well-being. Sexual activity. Eating habits. Work and work Astronomer. Screening  You may have the following tests or measurements: Height, weight, and BMI. Blood pressure. Lipid and cholesterol levels. These may be checked every 5 years, or  more frequently if you are over 48 years old. Skin check. Lung cancer screening. You may have this screening every year starting at age 77 if you have a 30-pack-year history of smoking and currently smoke or have quit within the past 15 years. Fecal occult blood test (FOBT) of the stool. You may have this test every year starting at age 58. Flexible sigmoidoscopy or colonoscopy. You may have a sigmoidoscopy every 5 years or a colonoscopy every 10 years starting at age 38. Prostate cancer screening. Recommendations will vary depending on your family history and other risks. Hepatitis C blood test. Hepatitis B blood test. Sexually transmitted disease (STD) testing. Diabetes screening. This is done by checking your blood sugar (glucose) after you have not eaten for a while (fasting). You may have this done every 1-3 years. Discuss your test results, treatment options, and if necessary, the need for more tests with your health care provider. Vaccines  Your health care provider may recommend certain vaccines, such as: Influenza vaccine. This is recommended every year. Tetanus, diphtheria, and acellular pertussis (Tdap, Td) vaccine. You may need a Td booster every 10 years. Zoster vaccine. You may need this after age 17. Pneumococcal 13-valent conjugate (PCV13) vaccine. You may need this if you have certain conditions and have not been vaccinated. Pneumococcal polysaccharide (PPSV23) vaccine. You may need one or two doses if you smoke cigarettes or if you have certain conditions. Talk to your health care provider about which screenings and vaccines you need and how often you need them. This information is not intended to replace advice given to you by your health care provider. Make sure you discuss any questions you have with your health care provider. Document  Released: 02/15/2015 Document Revised: 10/09/2015 Document Reviewed: 11/20/2014 Elsevier Interactive Patient Education  2017 [employer].  Fall Prevention in the Home Falls can cause injuries. They can happen to people of all ages. There are many things you can do to make your home safe and to help prevent falls. What can I do on the outside of my home? Regularly fix the edges of walkways and driveways and fix any cracks. Remove anything that might make you trip as you walk through a door, such as a raised step or threshold. Trim any bushes or trees on the path to your home. Use bright outdoor lighting. Clear any walking paths of anything that might make someone trip, such as rocks or tools. Regularly check to see if handrails are loose or broken. Make sure that both sides of any steps have handrails. Any raised decks and porches should have guardrails on the edges. Have any leaves, snow, or ice cleared regularly. Use sand or salt on walking paths during winter. Clean up any spills in your garage right away. This includes oil or grease spills. What can I do in the bathroom? Use night lights. Install grab bars by the toilet and in the tub and shower. Do not use towel bars as grab bars. Use non-skid mats or decals in the tub or shower. If you need to sit down in the shower, use a plastic, non-slip stool. Keep the floor dry. Clean up any water that spills on the floor as soon as it happens. Remove soap buildup in the tub or shower regularly. Attach bath mats securely with double-sided non-slip rug tape. Do not have throw rugs and other things on the floor that can make you trip. What can I do in the bedroom? Use night lights. Make sure that you have a light by your bed that is easy to reach. Do not use any sheets or blankets that are too big for your bed. They should not hang down onto the floor. Have a firm chair that has side arms. You can use this for support while you get dressed. Do not have throw rugs and other things on the floor that can make you trip. What can I do in the kitchen? Clean up any spills right  away. Avoid walking on wet floors. Keep items that you use a lot in easy-to-reach places. If you need to reach something above you, use a strong step stool that has a grab bar. Keep electrical cords out of the way. Do not use floor polish or wax that makes floors slippery. If you must use wax, use non-skid floor wax. Do not have throw rugs and other things on the floor that can make you trip. What can I do with my stairs? Do not leave any items on the stairs. Make sure that there are handrails on both sides of the stairs and use them. Fix handrails that are broken or loose. Make sure that handrails are as long as the stairways. Check any carpeting to make sure that it is firmly attached to the stairs. Fix any carpet that is loose or worn. Avoid having throw rugs at the top or bottom of the stairs. If you do have throw rugs, attach them to the floor with carpet tape. Make sure that you have a light switch at the top of the stairs and the bottom of the stairs. If you do not have them, ask someone to add them for you. What else can I do to help  prevent falls? Wear shoes that: Do not have high heels. Have rubber bottoms. Are comfortable and fit you well. Are closed at the toe. Do not wear sandals. If you use a stepladder: Make sure that it is fully opened. Do not climb a closed stepladder. Make sure that both sides of the stepladder are locked into place. Ask someone to hold it for you, if possible. Clearly mark and make sure that you can see: Any grab bars or handrails. First and last steps. Where the edge of each step is. Use tools that help you move around (mobility aids) if they are needed. These include: Canes. Walkers. Scooters. Crutches. Turn on the lights when you go into a dark area. Replace any light bulbs as soon as they burn out. Set up your furniture so you have a clear path. Avoid moving your furniture around. If any of your floors are uneven, fix them. If there are any  pets around you, be aware of where they are. Review your medicines with your doctor. Some medicines can make you feel dizzy. This can increase your chance of falling. Ask your doctor what other things that you can do to help prevent falls. This information is not intended to replace advice given to you by your health care provider. Make sure you discuss any questions you have with your health care provider. Document Released: 11/15/2008 Document Revised: 06/27/2015 Document Reviewed: 02/23/2014 Elsevier Interactive Patient Education  2017 [employer].

## 2022-06-30 NOTE — Progress Notes (Signed)
Subjective:   Brad Johnson is a 62 y.o. male who presents for Medicare Annual/Subsequent preventive examination.  This wellness visit is conducted by a nurse.  The patient's medications were reviewed and reconciled since the patient's last visit.  History details were provided by the patient.  The history appears to be reliable.    Medical History: Patient history and Family history was reviewed  Medications, Allergies, and preventative health maintenance was reviewed and updated.  Cardiac Risk Factors include: advanced age (>51men, >70 women);diabetes mellitus;obesity (BMI >30kg/m2);male gender     Objective:    Today's Vitals   06/30/22 0748  BP: 120/78  Pulse: (!) 54  Resp: 16  SpO2: 95%  Weight: (!) 305 lb (138.3 kg)  Height: 6' (1.829 m)   Body mass index is 41.37 kg/m.     06/08/2018   10:31 AM  Advanced Directives  Does Patient Have a Medical Advance Directive? Yes  Type of Advance Directive Healthcare Power of Attorney  Does patient want to make changes to medical advance directive? No - Guardian declined  Copy of Healthcare Power of Attorney in Chart? No - copy requested    Current Medications (verified) Outpatient Encounter Medications as of 06/30/2022  Medication Sig   ALPRAZolam (XANAX) 0.5 MG tablet TAKE 1 TABLET(0.5 MG) BY MOUTH DAILY AS NEEDED   aspirin EC 81 MG tablet Take 81 mg by mouth daily.   bethanechol (URECHOLINE) 25 MG tablet Take 25 mg by mouth daily.   celecoxib (CELEBREX) 200 MG capsule TAKE 1 CAPSULE(200 MG) BY MOUTH daily   dapagliflozin propanediol (FARXIGA) 5 MG TABS tablet Take 1 tablet (5 mg total) by mouth daily.   docusate sodium (COLACE) 100 MG capsule Take 100 mg by mouth 2 (two) times daily.   ezetimibe (ZETIA) 10 MG tablet Take 1 tablet (10 mg total) by mouth daily.   furosemide (LASIX) 20 MG tablet TAKE 1 TABLET(20 MG) BY MOUTH EVERY DAY   gabapentin (NEURONTIN) 600 MG tablet TAKE 1 TABLET(600 MG) BY MOUTH THREE TIMES DAILY    glucose blood (ONETOUCH ULTRA) test strip Use as instructed   JARDIANCE 10 MG TABS tablet Take 1 tablet (10 mg total) by mouth daily.   levofloxacin (LEVAQUIN) 500 MG tablet Once daily x 3 days for infection related to urolift procedure.   LINZESS 145 MCG CAPS capsule TAKE 1 CAPSULE BY MOUTH DAILY 30 MINUTES BEFORE FIRST MEAL OF THE DAY   lisinopril (ZESTRIL) 10 MG tablet TAKE 1 TABLET(10 MG) BY MOUTH DAILY   loratadine (CLARITIN) 10 MG tablet Take 10 mg by mouth daily as needed for allergies.   Omega-3 Fatty Acids (FISH OIL) 1000 MG CAPS Take 1 capsule by mouth daily.   omeprazole (PRILOSEC) 20 MG capsule TAKE 1 CAPSULE BY MOUTH DAILY   oxyCODONE (ROXICODONE) 15 MG immediate release tablet Take 15 mg by mouth every 4 (four) hours as needed.   rosuvastatin (CRESTOR) 20 MG tablet Take 1 tablet (20 mg total) by mouth daily.   Semaglutide,0.25 or 0.5MG /DOS, (OZEMPIC, 0.25 OR 0.5 MG/DOSE,) 2 MG/3ML SOPN INJECT 0.5 MG UNDER THE SKIN ONCE WEEKLY   tadalafil (CIALIS) 5 MG tablet Take 5 mg by mouth daily.   tamsulosin (FLOMAX) 0.4 MG CAPS capsule Take 0.4 mg by mouth daily.   No facility-administered encounter medications on file as of 06/30/2022.    Allergies (verified) Patient has no known allergies.   History: Past Medical History:  Diagnosis Date   Arthritis    Depression  Hyperlipidemia    Prostate enlargement    Past Surgical History:  Procedure Laterality Date   CYSTOSCOPY WITH INSERTION OF UROLIFT     SPINE SURGERY     TOTAL HIP ARTHROPLASTY     Family History  Problem Relation Age of Onset   Diabetes Mother    Lung cancer Mother    Diabetes Father    Cancer Father    Alzheimer's disease Father    Heart disease Father    Stroke Father    Social History   Socioeconomic History   Marital status: Widowed    Spouse name: Not on file   Number of children: Not on file   Years of education: Not on file   Highest education level: 9th grade  Occupational History   Not on  file  Tobacco Use   Smoking status: Never   Smokeless tobacco: Never  Vaping Use   Vaping Use: Never used  Substance and Sexual Activity   Alcohol use: Never   Drug use: Never   Sexual activity: Not on file  Other Topics Concern   Not on file  Social History Narrative   Not on file   Social Determinants of Health   Financial Resource Strain: Low Risk  (06/30/2022)   Overall Financial Resource Strain (CARDIA)    Difficulty of Paying Living Expenses: Not hard at all  Food Insecurity: No Food Insecurity (06/30/2022)   Hunger Vital Sign    Worried About Running Out of Food in the Last Year: Never true    Ran Out of Food in the Last Year: Never true  Transportation Needs: No Transportation Needs (06/30/2022)   PRAPARE - Administrator, Civil Service (Medical): No    Lack of Transportation (Non-Medical): No  Physical Activity: Sufficiently Active (06/30/2022)   Exercise Vital Sign    Days of Exercise per Week: 5 days    Minutes of Exercise per Session: 60 min  Stress: No Stress Concern Present (06/30/2022)   [employer] of Occupational Health - Occupational Stress Questionnaire    Feeling of Stress : Not at all  Social Connections: Not on file    Tobacco Counseling Counseling given: Not Answered   Clinical Intake:  Pre-visit preparation completed: Yes Pain : No/denies pain   BMI - recorded: 41.37 Nutritional Status: BMI > 30  Obese Nutritional Risks: None Diabetes: Yes (Most recent A1C 6.0) CBG done?: No Did pt. bring in CBG monitor from home?: No How often do you need to have someone help you when you read instructions, pamphlets, or other written materials from your doctor or pharmacy?: 1 - Never Interpreter Needed?: No     Activities of Daily Living    06/30/2022    7:40 AM  In your present state of health, do you have any difficulty performing the following activities:  Hearing? 0  Vision? 0  Difficulty concentrating or making decisions? 0   Walking or climbing stairs? 0  Dressing or bathing? 0  Doing errands, shopping? 0  Preparing Food and eating ? N  Using the Toilet? N  In the past six months, have you accidently leaked urine? N  Do you have problems with loss of bowel control? N  Managing your Medications? N  Managing your Finances? N  Housekeeping or managing your Housekeeping? N    Patient Care Team: Blane Ohara, MD as PCP - General (Family Medicine) Zettie Pho, [facility] (Pharmacist) Sheran Luz, MD as Consulting Physician (Physical Medicine and Rehabilitation) [place],  Maralyn Sago, FNP as Nurse Practitioner (Urology) Misenheimer, Marcial Pacas, MD as Consulting Physician (Gastroenterology)     Assessment:   This is a routine wellness examination for Brad Johnson.  Hearing/Vision screen No results found.  Dietary issues and exercise activities discussed: Current Exercise Habits: Home exercise routine, Type of exercise: strength training/weights;walking, Time (Minutes): 30, Frequency (Times/Week): 5, Weekly Exercise (Minutes/Week): 150, Intensity: Mild, Exercise limited by: None identified  Depression Screen    06/30/2022    8:13 AM 06/30/2022    8:11 AM 04/14/2021    7:27 AM 09/04/2020   10:55 AM 11/17/2019    7:50 AM 07/18/2019    7:57 AM 06/08/2018   11:10 AM  PHQ 2/9 Scores  PHQ - 2 Score 0 0 0 0 0 3 1  PHQ- 9 Score     0 3     Fall Risk    06/30/2022    8:13 AM 04/14/2021    7:27 AM 07/18/2019    7:40 AM 06/08/2018   10:42 AM  Fall Risk   Falls in the past year? 0 0 0 0  Number falls in past yr: 0 0 0   Injury with Fall? 0 0 0   Risk for fall due to : No Fall Risks     Follow up Falls evaluation completed  Falls evaluation completed Falls evaluation completed;Falls prevention discussed;Education provided    FALL RISK PREVENTION PERTAINING TO THE HOME:  Any stairs in or around the home? No  If so, are there any without handrails? No  Home free of loose throw rugs in walkways, pet beds, electrical cords,  etc? Yes  Adequate lighting in your home to reduce risk of falls? Yes   ASSISTIVE DEVICES UTILIZED TO PREVENT FALLS:  Life alert? No  Use of a cane, walker or w/c? No  Grab bars in the bathroom? Yes  Shower chair or bench in shower? Yes  Elevated toilet seat or a handicapped toilet? Yes   Gait slow and steady without use of assistive device  Cognitive Function:        06/30/2022    8:14 AM  6CIT Screen  What Year? 0 points  What month? 0 points  What time? 0 points  Count back from 20 0 points  Months in reverse 0 points  Repeat phrase 0 points  Total Score 0 points    Immunizations Immunization History  Administered Date(s) Administered   Influenza Inj Mdck Quad Pf 11/17/2019, 12/10/2020, 04/29/2022    TDAP status: Due, Education has been provided regarding the importance of this vaccine. Advised may receive this vaccine at local pharmacy or Health Dept. Aware to provide a copy of the vaccination record if obtained from local pharmacy or Health Dept. Verbalized acceptance and understanding.  Flu Vaccine status: Up to date  Pneumococcal vaccine status: Declined,  Education has been provided regarding the importance of this vaccine but patient still declined. Advised may receive this vaccine at local pharmacy or Health Dept. Aware to provide a copy of the vaccination record if obtained from local pharmacy or Health Dept. Verbalized acceptance and understanding.   Covid-19 vaccine status: Declined, Education has been provided regarding the importance of this vaccine but patient still declined. Advised may receive this vaccine at local pharmacy or Health Dept.or vaccine clinic. Aware to provide a copy of the vaccination record if obtained from local pharmacy or Health Dept. Verbalized acceptance and understanding.  Qualifies for Shingles Vaccine? Yes   Zostavax completed No   Shingrix Completed?: No.  Education has been provided regarding the importance of this vaccine.  Patient has been advised to call insurance company to determine out of pocket expense if they have not yet received this vaccine. Advised may also receive vaccine at local pharmacy or Health Dept. Verbalized acceptance and understanding.  Screening Tests Health Maintenance  Topic Date Due   Medicare Annual Wellness (AWV)  Never done   COVID-19 Vaccine (1) Never done   DTaP/Tdap/Td (1 - Tdap) Never done   Zoster Vaccines- Shingrix (1 of 2) Never done   Colonoscopy  05/04/2021   INFLUENZA VACCINE  09/03/2022   HEMOGLOBIN A1C  10/30/2022   OPHTHALMOLOGY EXAM  11/13/2022   Diabetic kidney evaluation - eGFR measurement  04/29/2023   Diabetic kidney evaluation - Urine ACR  04/29/2023   FOOT EXAM  04/29/2023   Hepatitis C Screening  Completed   HIV Screening  Completed   HPV VACCINES  Aged Out    Health Maintenance  Health Maintenance Due  Topic Date Due   Medicare Annual Wellness (AWV)  Never done   COVID-19 Vaccine (1) Never done   DTaP/Tdap/Td (1 - Tdap) Never done   Zoster Vaccines- Shingrix (1 of 2) Never done   Colonoscopy  05/04/2021    Colorectal cancer screening: Type of screening: Colonoscopy. Completed 2013. Repeat every 10 years  Lung Cancer Screening: (Low Dose CT Chest recommended if Age 61-80 years, 30 pack-year currently smoking OR have quit w/in 15years.) does not qualify.   Additional Screening:  Vision Screening: Recommended annual ophthalmology exams for early detection of glaucoma and other disorders of the eye. Is the patient up to date with their annual eye exam?  Yes  Who is the provider or what is the name of the office in which the patient attends annual eye exams? [facility]  Dental Screening: Recommended annual dental exams for proper oral hygiene  Community Resource Referral / Chronic Care Management: CRR required this visit?  No   CCM required this visit?  No      Plan:    1- Vaccines declined by patient 2- Patient agreed to call Dr  Misenheimer's office to schedule colonoscopy  I have personally reviewed and noted the following in the patient's chart:   Medical and social history Use of alcohol, tobacco or illicit drugs  Current medications and supplements including opioid prescriptions.  Functional ability and status Nutritional status Physical activity Advanced directives List of other physicians Hospitalizations, surgeries, and ER visits in previous 12 months Vitals Screenings to include cognitive, depression, and falls Referrals and appointments  In addition, I have reviewed and discussed with patient certain preventive protocols, quality metrics, and best practice recommendations. A written personalized care plan for preventive services as well as general preventive health recommendations were provided to patient.     Jacklynn Bue, LPN   [id number]/61/[id number]

## 2022-07-12 ENCOUNTER — Other Ambulatory Visit: Payer: Self-pay | Admitting: Family Medicine

## 2022-07-12 DIAGNOSIS — E1142 Type 2 diabetes mellitus with diabetic polyneuropathy: Secondary | ICD-10-CM

## 2022-07-18 ENCOUNTER — Other Ambulatory Visit: Payer: Self-pay | Admitting: Family Medicine

## 2022-07-27 ENCOUNTER — Other Ambulatory Visit: Payer: Self-pay | Admitting: Family Medicine

## 2022-07-28 DIAGNOSIS — M5416 Radiculopathy, lumbar region: Secondary | ICD-10-CM | POA: Diagnosis not present

## 2022-07-28 DIAGNOSIS — Z79891 Long term (current) use of opiate analgesic: Secondary | ICD-10-CM | POA: Diagnosis not present

## 2022-07-28 DIAGNOSIS — M25562 Pain in left knee: Secondary | ICD-10-CM | POA: Diagnosis not present

## 2022-07-28 DIAGNOSIS — G894 Chronic pain syndrome: Secondary | ICD-10-CM | POA: Diagnosis not present

## 2022-07-28 DIAGNOSIS — M5459 Other low back pain: Secondary | ICD-10-CM | POA: Diagnosis not present

## 2022-08-12 ENCOUNTER — Other Ambulatory Visit: Payer: Self-pay | Admitting: Family Medicine

## 2022-08-12 DIAGNOSIS — I10 Essential (primary) hypertension: Secondary | ICD-10-CM

## 2022-08-17 DIAGNOSIS — M1712 Unilateral primary osteoarthritis, left knee: Secondary | ICD-10-CM | POA: Diagnosis not present

## 2022-10-06 DIAGNOSIS — M1611 Unilateral primary osteoarthritis, right hip: Secondary | ICD-10-CM | POA: Diagnosis not present

## 2022-10-06 DIAGNOSIS — M25551 Pain in right hip: Secondary | ICD-10-CM | POA: Diagnosis not present

## 2022-10-11 ENCOUNTER — Other Ambulatory Visit: Payer: Self-pay | Admitting: Family Medicine

## 2022-10-20 DIAGNOSIS — M1611 Unilateral primary osteoarthritis, right hip: Secondary | ICD-10-CM | POA: Diagnosis not present

## 2022-10-26 DIAGNOSIS — Z125 Encounter for screening for malignant neoplasm of prostate: Secondary | ICD-10-CM | POA: Diagnosis not present

## 2022-10-26 DIAGNOSIS — R338 Other retention of urine: Secondary | ICD-10-CM | POA: Diagnosis not present

## 2022-10-26 DIAGNOSIS — N401 Enlarged prostate with lower urinary tract symptoms: Secondary | ICD-10-CM | POA: Diagnosis not present

## 2022-10-26 DIAGNOSIS — N411 Chronic prostatitis: Secondary | ICD-10-CM | POA: Diagnosis not present

## 2022-10-26 DIAGNOSIS — R339 Retention of urine, unspecified: Secondary | ICD-10-CM | POA: Diagnosis not present

## 2022-10-30 ENCOUNTER — Ambulatory Visit: Payer: PPO | Attending: Family Medicine

## 2022-10-30 ENCOUNTER — Other Ambulatory Visit: Payer: Self-pay | Admitting: Family Medicine

## 2022-10-30 ENCOUNTER — Ambulatory Visit (INDEPENDENT_AMBULATORY_CARE_PROVIDER_SITE_OTHER): Payer: PPO | Admitting: Family Medicine

## 2022-10-30 VITALS — BP 138/70 | HR 60 | Temp 97.1°F | Resp 18 | Ht 72.0 in | Wt 305.0 lb

## 2022-10-30 DIAGNOSIS — M1611 Unilateral primary osteoarthritis, right hip: Secondary | ICD-10-CM

## 2022-10-30 DIAGNOSIS — E1142 Type 2 diabetes mellitus with diabetic polyneuropathy: Secondary | ICD-10-CM

## 2022-10-30 DIAGNOSIS — Z6841 Body Mass Index (BMI) 40.0 and over, adult: Secondary | ICD-10-CM | POA: Diagnosis not present

## 2022-10-30 DIAGNOSIS — E782 Mixed hyperlipidemia: Secondary | ICD-10-CM

## 2022-10-30 DIAGNOSIS — R001 Bradycardia, unspecified: Secondary | ICD-10-CM

## 2022-10-30 DIAGNOSIS — Z01810 Encounter for preprocedural cardiovascular examination: Secondary | ICD-10-CM

## 2022-10-30 DIAGNOSIS — I10 Essential (primary) hypertension: Secondary | ICD-10-CM | POA: Diagnosis not present

## 2022-10-30 DIAGNOSIS — R5383 Other fatigue: Secondary | ICD-10-CM

## 2022-10-30 DIAGNOSIS — Z23 Encounter for immunization: Secondary | ICD-10-CM

## 2022-10-30 DIAGNOSIS — E66813 Obesity, class 3: Secondary | ICD-10-CM

## 2022-10-30 DIAGNOSIS — N401 Enlarged prostate with lower urinary tract symptoms: Secondary | ICD-10-CM

## 2022-10-30 DIAGNOSIS — M1712 Unilateral primary osteoarthritis, left knee: Secondary | ICD-10-CM | POA: Diagnosis not present

## 2022-10-30 DIAGNOSIS — N138 Other obstructive and reflux uropathy: Secondary | ICD-10-CM

## 2022-10-30 HISTORY — DX: Bradycardia, unspecified: R00.1

## 2022-10-30 HISTORY — DX: Encounter for preprocedural cardiovascular examination: Z01.810

## 2022-10-30 HISTORY — DX: Other fatigue: R53.83

## 2022-10-30 LAB — POCT URINALYSIS DIP (CLINITEK)
Bilirubin, UA: NEGATIVE
Blood, UA: NEGATIVE
Glucose, UA: 100 mg/dL — AB
Ketones, POC UA: NEGATIVE mg/dL
Leukocytes, UA: NEGATIVE
Nitrite, UA: NEGATIVE
POC PROTEIN,UA: NEGATIVE
Spec Grav, UA: 1.015 (ref 1.010–1.025)
Urobilinogen, UA: 0.2 U/dL
pH, UA: 7 (ref 5.0–8.0)

## 2022-10-30 MED ORDER — CELECOXIB 200 MG PO CAPS
ORAL_CAPSULE | ORAL | 1 refills | Status: AC
Start: 2022-10-30 — End: ?

## 2022-10-30 NOTE — Assessment & Plan Note (Signed)
Well controlled.  No changes to medicines. Lisinopril 10 mg daily, Aspirin 81 mg daily, Continue to work on eating a healthy diet and exercise.  Labs drawn today.

## 2022-10-30 NOTE — Assessment & Plan Note (Signed)
Control: good Recommend check sugars fasting daily. Recommend check feet daily. Recommend annual eye exams. Medicines: Jardiance 10 mg daily, Increase Ozempic 1 mg weekly, farxiga 5 mg daily Continue to work on eating a healthy diet and exercise.  Labs drawn today.
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
## 2022-10-30 NOTE — Assessment & Plan Note (Signed)
Well controlled.  No changes to medicines. Taking Rosuvastatin 20 mg daily, fish oil Continue to work on eating a healthy diet and exercise.  Labs drawn today.

## 2022-10-30 NOTE — Progress Notes (Unsigned)
Enrolled patient for a 14 day Zio XT monitor to be mailed to patients home  EP to read

## 2022-10-31 NOTE — Assessment & Plan Note (Signed)
The current medical regimen is effective;  continue present plan and medications.  Tamsulosin 0.4 mg daily 

## 2022-10-31 NOTE — Assessment & Plan Note (Signed)
Management per specialist

## 2022-10-31 NOTE — Assessment & Plan Note (Signed)
Lab ordered and EKG Order Chest Xray In preparation for either Hip replacement or knee replacement, whichever is recommended first.

## 2022-10-31 NOTE — Assessment & Plan Note (Signed)
Check labs 

## 2022-11-01 ENCOUNTER — Encounter: Payer: Self-pay | Admitting: Family Medicine

## 2022-11-01 DIAGNOSIS — M1611 Unilateral primary osteoarthritis, right hip: Secondary | ICD-10-CM

## 2022-11-01 DIAGNOSIS — E66813 Obesity, class 3: Secondary | ICD-10-CM

## 2022-11-01 DIAGNOSIS — M1712 Unilateral primary osteoarthritis, left knee: Secondary | ICD-10-CM

## 2022-11-01 HISTORY — DX: Unilateral primary osteoarthritis, right hip: M16.11

## 2022-11-01 HISTORY — DX: Unilateral primary osteoarthritis, left knee: M17.12

## 2022-11-01 HISTORY — DX: Obesity, class 3: E66.813

## 2022-11-01 LAB — URINE CULTURE: Organism ID, Bacteria: NO GROWTH

## 2022-11-01 LAB — MRSA CULTURE: MRSA Screen: NEGATIVE

## 2022-11-01 MED ORDER — SEMAGLUTIDE (1 MG/DOSE) 4 MG/3ML ~~LOC~~ SOPN
1.0000 mg | PEN_INJECTOR | SUBCUTANEOUS | 2 refills | Status: DC
Start: 2022-11-01 — End: 2022-12-22

## 2022-11-01 NOTE — Assessment & Plan Note (Signed)
Management per specialist. 

## 2022-11-01 NOTE — Assessment & Plan Note (Signed)
Recommend continue to work on eating healthy diet. Comorbidities: diabetes, hyperlipidemia.

## 2022-11-03 LAB — COMPREHENSIVE METABOLIC PANEL
ALT: 17 [IU]/L (ref 0–44)
AST: 21 [IU]/L (ref 0–40)
Albumin: 4.3 g/dL (ref 3.9–4.9)
Alkaline Phosphatase: 60 [IU]/L (ref 44–121)
BUN/Creatinine Ratio: 13 (ref 10–24)
BUN: 9 mg/dL (ref 8–27)
Bilirubin Total: 0.5 mg/dL (ref 0.0–1.2)
CO2: 25 mmol/L (ref 20–29)
Calcium: 9.5 mg/dL (ref 8.6–10.2)
Chloride: 99 mmol/L (ref 96–106)
Creatinine, Ser: 0.69 mg/dL — ABNORMAL LOW (ref 0.76–1.27)
Globulin, Total: 2.1 g/dL (ref 1.5–4.5)
Glucose: 115 mg/dL — ABNORMAL HIGH (ref 70–99)
Potassium: 4.2 mmol/L (ref 3.5–5.2)
Sodium: 141 mmol/L (ref 134–144)
Total Protein: 6.4 g/dL (ref 6.0–8.5)
eGFR: 104 mL/min/{1.73_m2} (ref >59)

## 2022-11-03 LAB — HEMOGLOBIN A1C
Est. average glucose Bld gHb Est-mCnc: 140 mg/dL
Hgb A1c MFr Bld: 6.5 % — ABNORMAL HIGH (ref 4.8–5.6)

## 2022-11-03 LAB — PSA: Prostate Specific Ag, Serum: 0.3 ng/mL (ref 0.0–4.0)

## 2022-11-03 LAB — CBC WITH DIFFERENTIAL/PLATELET
Basophils Absolute: 0.1 10*3/uL (ref 0.0–0.2)
Basos: 2 %
EOS (ABSOLUTE): 0.1 10*3/uL (ref 0.0–0.4)
Eos: 3 %
Hematocrit: 41 % (ref 37.5–51.0)
Hemoglobin: 13.6 g/dL (ref 13.0–17.7)
Immature Grans (Abs): 0 10*3/uL (ref 0.0–0.1)
Immature Granulocytes: 0 %
Lymphocytes Absolute: 1.1 10*3/uL (ref 0.7–3.1)
Lymphs: 41 %
MCH: 31.7 pg (ref 26.6–33.0)
MCHC: 33.2 g/dL (ref 31.5–35.7)
MCV: 96 fL (ref 79–97)
Monocytes Absolute: 0.4 10*3/uL (ref 0.1–0.9)
Monocytes: 13 %
Neutrophils Absolute: 1.1 10*3/uL — ABNORMAL LOW (ref 1.4–7.0)
Neutrophils: 41 %
Platelets: 181 10*3/uL (ref 150–450)
RBC: 4.29 x10E6/uL (ref 4.14–5.80)
RDW: 12.5 % (ref 11.6–15.4)
WBC: 2.8 10*3/uL — ABNORMAL LOW (ref 3.4–10.8)

## 2022-11-03 LAB — LIPID PANEL
Chol/HDL Ratio: 2.8 {ratio} (ref 0.0–5.0)
Cholesterol, Total: 154 mg/dL (ref 100–199)
HDL: 56 mg/dL (ref >39)
LDL Chol Calc (NIH): 81 mg/dL (ref 0–99)
Triglycerides: 89 mg/dL (ref 0–149)
VLDL Cholesterol Cal: 17 mg/dL (ref 5–40)

## 2022-11-03 LAB — TESTOSTERONE,FREE AND TOTAL
Testosterone, Free: 1.1 pg/mL — ABNORMAL LOW (ref 6.6–18.1)
Testosterone: 388 ng/dL (ref 264–916)

## 2022-11-03 LAB — PROTIME-INR
INR: 1 (ref 0.9–1.2)
Prothrombin Time: 11.2 s (ref 9.1–12.0)

## 2022-11-03 LAB — APTT: aPTT: 28 s (ref 24–33)

## 2022-11-04 DIAGNOSIS — Z01818 Encounter for other preprocedural examination: Secondary | ICD-10-CM | POA: Diagnosis not present

## 2022-11-04 DIAGNOSIS — Z01810 Encounter for preprocedural cardiovascular examination: Secondary | ICD-10-CM | POA: Diagnosis not present

## 2022-11-06 DIAGNOSIS — E1142 Type 2 diabetes mellitus with diabetic polyneuropathy: Secondary | ICD-10-CM | POA: Diagnosis not present

## 2022-11-06 DIAGNOSIS — R001 Bradycardia, unspecified: Secondary | ICD-10-CM

## 2022-11-08 ENCOUNTER — Other Ambulatory Visit: Payer: Self-pay | Admitting: Family Medicine

## 2022-11-08 DIAGNOSIS — E782 Mixed hyperlipidemia: Secondary | ICD-10-CM

## 2022-11-08 DIAGNOSIS — I10 Essential (primary) hypertension: Secondary | ICD-10-CM

## 2022-11-10 ENCOUNTER — Other Ambulatory Visit: Payer: Self-pay | Admitting: Family Medicine

## 2022-11-10 DIAGNOSIS — G629 Polyneuropathy, unspecified: Secondary | ICD-10-CM

## 2022-11-18 DIAGNOSIS — E119 Type 2 diabetes mellitus without complications: Secondary | ICD-10-CM | POA: Diagnosis not present

## 2022-11-18 DIAGNOSIS — M1712 Unilateral primary osteoarthritis, left knee: Secondary | ICD-10-CM | POA: Diagnosis not present

## 2022-11-19 DIAGNOSIS — E559 Vitamin D deficiency, unspecified: Secondary | ICD-10-CM | POA: Diagnosis not present

## 2022-11-19 DIAGNOSIS — Z01818 Encounter for other preprocedural examination: Secondary | ICD-10-CM | POA: Diagnosis not present

## 2022-11-19 DIAGNOSIS — Z79899 Other long term (current) drug therapy: Secondary | ICD-10-CM | POA: Diagnosis not present

## 2022-11-19 DIAGNOSIS — M79609 Pain in unspecified limb: Secondary | ICD-10-CM | POA: Diagnosis not present

## 2022-11-21 DIAGNOSIS — G894 Chronic pain syndrome: Secondary | ICD-10-CM | POA: Diagnosis not present

## 2022-11-23 ENCOUNTER — Telehealth: Payer: Self-pay | Admitting: Family Medicine

## 2022-11-23 ENCOUNTER — Encounter: Payer: Self-pay | Admitting: Family Medicine

## 2022-11-23 DIAGNOSIS — M25561 Pain in right knee: Secondary | ICD-10-CM | POA: Diagnosis not present

## 2022-11-23 NOTE — Telephone Encounter (Signed)
[facility] HEALTH MEDICAL CLEARANCE

## 2022-11-25 DIAGNOSIS — R001 Bradycardia, unspecified: Secondary | ICD-10-CM | POA: Diagnosis not present

## 2022-11-30 ENCOUNTER — Other Ambulatory Visit: Payer: Self-pay | Admitting: Family Medicine

## 2022-12-01 ENCOUNTER — Encounter: Payer: Self-pay | Admitting: Orthopedic Surgery

## 2022-12-02 ENCOUNTER — Other Ambulatory Visit: Payer: Self-pay

## 2022-12-02 DIAGNOSIS — K5909 Other constipation: Secondary | ICD-10-CM

## 2022-12-02 MED ORDER — LINACLOTIDE 145 MCG PO CAPS: 145.0000 ug | ORAL_CAPSULE | Freq: Every day | ORAL | 2 refills | Status: DC

## 2022-12-04 HISTORY — PX: TOTAL KNEE ARTHROPLASTY: SHX125

## 2022-12-06 ENCOUNTER — Other Ambulatory Visit: Payer: Self-pay | Admitting: Family Medicine

## 2022-12-06 DIAGNOSIS — I10 Essential (primary) hypertension: Secondary | ICD-10-CM

## 2022-12-07 ENCOUNTER — Other Ambulatory Visit: Payer: Self-pay

## 2022-12-07 DIAGNOSIS — I471 Supraventricular tachycardia, unspecified: Secondary | ICD-10-CM

## 2022-12-07 MED ORDER — JARDIANCE 10 MG PO TABS: 10.0000 mg | ORAL_TABLET | Freq: Every day | ORAL | 2 refills | Status: DC

## 2022-12-07 MED ORDER — ALPRAZOLAM 0.5 MG PO TABS: ORAL_TABLET | ORAL | 2 refills | Status: AC

## 2022-12-08 ENCOUNTER — Other Ambulatory Visit: Payer: Self-pay

## 2022-12-08 DIAGNOSIS — E785 Hyperlipidemia, unspecified: Secondary | ICD-10-CM | POA: Insufficient documentation

## 2022-12-08 DIAGNOSIS — N400 Enlarged prostate without lower urinary tract symptoms: Secondary | ICD-10-CM | POA: Insufficient documentation

## 2022-12-08 DIAGNOSIS — M1990 Unspecified osteoarthritis, unspecified site: Secondary | ICD-10-CM | POA: Insufficient documentation

## 2022-12-08 DIAGNOSIS — F32A Depression, unspecified: Secondary | ICD-10-CM | POA: Insufficient documentation

## 2022-12-08 NOTE — Addendum Note (Signed)
Addended by: Jacklynn Bue on: 12/08/2022 08:07 AM   Modules accepted: Orders

## 2022-12-09 ENCOUNTER — Encounter: Payer: Self-pay | Admitting: Cardiology

## 2022-12-09 ENCOUNTER — Ambulatory Visit: Payer: PPO | Attending: Cardiology | Admitting: Cardiology

## 2022-12-09 VITALS — BP 130/74 | HR 60 | Ht 72.0 in | Wt 299.2 lb

## 2022-12-09 DIAGNOSIS — I10 Essential (primary) hypertension: Secondary | ICD-10-CM

## 2022-12-09 DIAGNOSIS — I209 Angina pectoris, unspecified: Secondary | ICD-10-CM | POA: Diagnosis not present

## 2022-12-09 DIAGNOSIS — E782 Mixed hyperlipidemia: Secondary | ICD-10-CM | POA: Diagnosis not present

## 2022-12-09 DIAGNOSIS — I259 Chronic ischemic heart disease, unspecified: Secondary | ICD-10-CM | POA: Diagnosis not present

## 2022-12-09 DIAGNOSIS — E0842 Diabetes mellitus due to underlying condition with diabetic polyneuropathy: Secondary | ICD-10-CM

## 2022-12-09 DIAGNOSIS — R079 Chest pain, unspecified: Secondary | ICD-10-CM

## 2022-12-09 DIAGNOSIS — Z6839 Body mass index (BMI) 39.0-39.9, adult: Secondary | ICD-10-CM

## 2022-12-09 MED ORDER — NITROGLYCERIN 0.4 MG SL SUBL: 0.4000 mg | SUBLINGUAL_TABLET | SUBLINGUAL | 6 refills | Status: DC | PRN

## 2022-12-09 NOTE — Addendum Note (Signed)
Addended by: Eleonore Chiquito on: 12/09/2022 09:26 AM   Modules accepted: Orders

## 2022-12-09 NOTE — Patient Instructions (Signed)
Medication Instructions:  Your physician has recommended you make the following change in your medication:   Use nitroglycerin 1 tablet placed under the tongue at the first sign of chest pain or an angina attack. 1 tablet may be used every 5 minutes as needed, for up to 15 minutes. Do not take more than 3 tablets in 15 minutes. If pain persist call 911 or go to the nearest ED.   *If you need a refill on your cardiac medications before your next appointment, please call your pharmacy*   Lab Work: Your physician recommends that you have a BMP today in the office.   If you have labs (blood work) drawn today and your tests are completely normal, you will receive your results only by: MyChart Message (if you have MyChart) OR A paper copy in the mail If you have any lab test that is abnormal or we need to change your treatment, we will call you to review the results.   Testing/Procedures:   Your cardiac CT will be scheduled at the following location:   [facility] [address], [address] [place],  [place]  [address]  Main: [phone]  Hold all erectile dysfunction medications at least 3 days (72 hrs) prior to test. (Ie viagra, cialis, sildenafil, tadalafil, etc) We will administer nitroglycerin during this exam.   On the Night Before the Test: Be sure to Drink plenty of water. Do not consume any caffeinated/decaffeinated beverages or chocolate 12 hours prior to your test. Do not take any antihistamines 12 hours prior to your test.  On the Day of the Test: Drink plenty of water until 1 hour prior to the test. Do not eat any food 1 hour prior to test. You may take your regular medications prior to the test.  HOLD Furosemide morning of the test.      After the Test: Drink plenty of water. After receiving IV contrast, you may experience a mild flushed feeling. This is normal. On occasion, you may experience a mild rash up to 24  hours after the test. This is not dangerous. If this occurs, you can take Benadryl 25 mg and increase your fluid intake. If you experience trouble breathing, this can be serious. If it is severe call 911 IMMEDIATELY. If it is mild, please call our office. If you take any of these medications: Glipizide/Metformin, Avandament, Glucavance, please do not take 48 hours after completing test unless otherwise instructed.  We will call to schedule your test 2-4 weeks out understanding that some insurance companies will need an authorization prior to the service being performed.   For non-scheduling related questions, please contact the cardiac imaging nurse navigator should you have any questions/concerns: Rockwell Alexandria, Cardiac Imaging Nurse Navigator Larey Brick, Cardiac Imaging Nurse Navigator [facility] Direct Office Dial: [phone]   For scheduling needs, including cancellations and rescheduling, please call [place], [phone].   Your physician has requested that you have an echocardiogram. Echocardiography is a painless test that uses sound waves to create images of your heart. It provides your doctor with information about the size and shape of your heart and how well your heart's chambers and valves are working. This procedure takes approximately one hour. There are no restrictions for this procedure. Please do NOT wear cologne, perfume, aftershave, or lotions (deodorant is allowed). Please arrive 15 minutes prior to your appointment time.   Your next appointment:   9 month(s)  The format for your next appointment:   In Person  Provider:   Belva Crome, MD   Other Instructions Cardiac CT Angiogram A cardiac CT angiogram is a procedure to look at the heart and the area around the heart. It may be done to help find the cause of chest pains or other symptoms of heart disease. During this procedure, a substance called contrast dye is injected into the  blood vessels in the area to be checked. A large X-ray machine, called a CT scanner, then takes detailed pictures of the heart and the surrounding area. The procedure is also sometimes called a coronary CT angiogram, coronary artery scanning, or CTA. A cardiac CT angiogram allows the health care provider to see how well blood is flowing to and from the heart. The health care provider will be able to see if there are any problems, such as: Blockage or narrowing of the coronary arteries in the heart. Fluid around the heart. Signs of weakness or disease in the muscles, valves, and tissues of the heart. Tell a health care provider about: Any allergies you have. This is especially important if you have had a previous allergic reaction to contrast dye. All medicines you are taking, including vitamins, herbs, eye drops, creams, and over-the-counter medicines. Any blood disorders you have. Any surgeries you have had. Any medical conditions you have. Whether you are pregnant or may be pregnant. Any anxiety disorders, chronic pain, or other conditions you have that may increase your stress or prevent you from lying still. What are the risks? Generally, this is a safe procedure. However, problems may occur, including: Bleeding. Infection. Allergic reactions to medicines or dyes. Damage to other structures or organs. Kidney damage from the contrast dye that is used. Increased risk of cancer from radiation exposure. This risk is low. Talk with your health care provider about: The risks and benefits of testing. How you can receive the lowest dose of radiation. What happens before the procedure? Wear comfortable clothing and remove any jewelry, glasses, dentures, and hearing aids. Follow instructions from your health care provider about eating and drinking. This may include: For 12 hours before the procedure -- avoid caffeine. This includes tea, coffee, soda, energy drinks, and diet pills. Drink plenty of  water or other fluids that do not have caffeine in them. Being well hydrated can prevent complications. For 4-6 hours before the procedure -- stop eating and drinking. The contrast dye can cause nausea, but this is less likely if your stomach is empty. Ask your health care provider about changing or stopping your regular medicines. This is especially important if you are taking diabetes medicines, blood thinners, or medicines to treat problems with erections (erectile dysfunction). What happens during the procedure?  Hair on your chest may need to be removed so that small sticky patches called electrodes can be placed on your chest. These will transmit information that helps to monitor your heart during the procedure. An IV will be inserted into one of your veins. You might be given a medicine to control your heart rate during the procedure. This will help to ensure that good images are obtained. You will be asked to lie on an exam table. This table will slide in and out of the CT machine during the procedure. Contrast dye will be injected into the IV. You might feel warm, or you may get a metallic taste in your mouth. You will be given a medicine called nitroglycerin. This will relax or dilate the  arteries in your heart. The table that you are lying on will move into the CT machine tunnel for the scan. The person running the machine will give you instructions while the scans are being done. You may be asked to: Keep your arms above your head. Hold your breath. Stay very still, even if the table is moving. When the scanning is complete, you will be moved out of the machine. The IV will be removed. The procedure may vary among health care providers and hospitals. What can I expect after the procedure? After your procedure, it is common to have: A metallic taste in your mouth from the contrast dye. A feeling of warmth. A headache from the nitroglycerin. Follow these instructions at home: Take  over-the-counter and prescription medicines only as told by your health care provider. If you are told, drink enough fluid to keep your urine pale yellow. This will help to flush the contrast dye out of your body. Most people can return to their normal activities right after the procedure. Ask your health care provider what activities are safe for you. It is up to you to get the results of your procedure. Ask your health care provider, or the department that is doing the procedure, when your results will be ready. Keep all follow-up visits as told by your health care provider. This is important. Contact a health care provider if: You have any symptoms of allergy to the contrast dye. These include: Shortness of breath. Rash or hives. A racing heartbeat. Summary A cardiac CT angiogram is a procedure to look at the heart and the area around the heart. It may be done to help find the cause of chest pains or other symptoms of heart disease. During this procedure, a large X-ray machine, called a CT scanner, takes detailed pictures of the heart and the surrounding area after a contrast dye has been injected into blood vessels in the area. Ask your health care provider about changing or stopping your regular medicines before the procedure. This is especially important if you are taking diabetes medicines, blood thinners, or medicines to treat erectile dysfunction. If you are told, drink enough fluid to keep your urine pale yellow. This will help to flush the contrast dye out of your body. This information is not intended to replace advice given to you by your health care provider. Make sure you discuss any questions you have with your health care provider. Document Revised: 09/14/2018 Document Reviewed: 09/14/2018 Elsevier Patient Education  [employer].  Echocardiogram An echocardiogram is a test that uses sound waves to make images of your heart. This way of making images is often called  ultrasound. The images from this test can help find out many things about your heart, including: The size and shape of your heart. The strength of your heart muscle and how well it's working. The size, thickness, and movement of your heart's walls. How your heart valves are working. Problems such as: A tumor or a growth from an infection around the heart valves. Areas of heart muscle that aren't working well because of poor blood flow or injury from a heart attack. An aneurysm. This is a weak or damaged part of an artery wall. An artery is a blood vessel. Tell a health care provider about: Any allergies you have. All medicines you're taking, including vitamins, herbs, eye drops, creams, and over-the-counter medicines. Any bleeding problems you have. Any surgeries you've had. Any medical problems you have. Whether you're pregnant or  may be pregnant. What are the risks? Your health care provider will talk with you about risks. These may include an allergic reaction to IV dye that may be used during the test. What happens before the test? You don't need to do anything to get ready for this test. You may eat and drink normally. What happens during the test?  You'll take off your clothes from the waist up and put on a hospital gown. Sticky patches called electrodes may be placed on your chest. These will be connected to a machine that monitors your heart rate and rhythm. You'll lie down on a table for the exam. A wand covered in gel will be moved over your chest. Sound waves from the wand will go to your heart and bounce back--or "echo" back. The sound waves will go to a computer that uses them to make images of your heart. The images can be viewed on a monitor. The images will also be recorded on the computer so your provider can look at them later. You may be asked to change positions or hold your breath for a short time. This makes it easier to get different views or better views of your  heart. In some cases, you may be given a dye through an IV. The IV is put into one of your veins. This dye can make the areas of your heart easier to see. The procedure may vary among providers and hospitals. What can I expect after the test? You may return to your normal diet, activities, and medicines unless your provider tells you not to. If an IV was placed for the test, it will be removed. It's up to you to get the results of your test. Ask your provider, or the department that's doing the test, when your results will be ready. This information is not intended to replace advice given to you by your health care provider. Make sure you discuss any questions you have with your health care provider. Document Revised: 03/20/2022 Document Reviewed: 03/20/2022 Elsevier Patient Education  2024 [employer].

## 2022-12-09 NOTE — Progress Notes (Signed)
Cardiology Office Note:    Date:  12/09/2022   ID:  Brad Johnson, DOB 06/08/1960, MRN [id number]  PCP:  Blane Ohara, MD  Cardiologist:  Garwin Brothers, MD   Referring MD: Blane Ohara, MD    ASSESSMENT:    1. Mixed hyperlipidemia   2. Chest pain of uncertain etiology   3. Angina pectoris (HCC)   4. Chest pain due to myocardial ischemia, unspecified ischemic chest pain type   5. Essential hypertension, benign   6. Diabetic polyneuropathy associated with diabetes mellitus due to underlying condition (HCC)   7. BMI 39.0-39.9,adult    PLAN:    In order of problems listed above:  Angina pectoris: I discussed my findings with the patient at length.  I did not send him a nitroglycerin prescription because he takes Cialis on a daily basis.  He was advised to call 911 for any significant concerns of chest tightness or chest pain.  He was advised not to exert himself until reevaluating.  I discussed with him various modalities of evaluation pros and cons.  He prefers CT coronary angiography and we will set him up for this.  Further recommendations will depend on the findings of this test. Essential hypertension: Blood pressure stable and diet was emphasized. Mixed dyslipidemia and diabetes mellitus: Lipids followed by primary care.  KPN sheet reviewed and discussed with the patient at length. Morbid obesity: Weight reduction stressed risks of obesity explained and he promises to do better. Cardiac murmur: Echocardiogram will be done to assess murmur heard on auscultation. Patient will be seen in follow-up appointment in 6 months or earlier if the patient has any concerns.    Medication Adjustments/Labs and Tests Ordered: Current medicines are reviewed at length with the patient today.  Concerns regarding medicines are outlined above.  Orders Placed This Encounter  Procedures   CT CORONARY MORPH W/CTA COR W/SCORE W/CA W/CM &/OR WO/CM   Basic metabolic panel   EKG 12-Lead    ECHOCARDIOGRAM COMPLETE   Meds ordered this encounter  Medications   nitroGLYCERIN (NITROSTAT) 0.4 MG SL tablet    Sig: Place 1 tablet (0.4 mg total) under the tongue every 5 (five) minutes as needed.    Dispense:  25 tablet    Refill:  6     History of Present Illness:    Brad Johnson is a 62 y.o. male who is being seen today for the evaluation of chest pain at the request of Cox, Kirsten, MD. patient is a pleasant 62 year old male.  He is a Education administrator by profession.  He has past medical history of essential hypertension, mixed dyslipidemia with polyneuropathy with diabetes mellitus and morbid obesity.  He mentions to me that he has chest tightness at times.  This is not much related to exertion.  It can come with stress.  He feels some radiation to the neck at times.  No radiation to the arms.  For this reason he is here to be evaluated.  His event monitor was abnormal but largely unremarkable.  No dizziness or any syncope.  He had slow heart rate during sleep hours.  At the time of my evaluation, the patient is alert awake oriented and in no distress.  Past Medical History:  Diagnosis Date   Adjustment disorder with depressed mood 03/19/2019   Arthritis    BMI 39.0-39.9,adult 08/27/2021   CHI (closed head injury), sequela 03/19/2019   Class 3 severe obesity due to excess calories with serious comorbidity and body mass  index (BMI) of 40.0 to 44.9 in adult [facility]) 11/01/2022   Depression    Diabetic polyneuropathy (HCC) 03/19/2019   Disc displacement, lumbar 06/08/2018   Dysthymic disorder 06/08/2018   Encounter for long-term use of opiate analgesic 03/19/2019   Enlarged prostate with urinary obstruction 06/08/2018   Esophageal reflux 06/08/2018   Essential hypertension, benign 06/08/2018   History of total left hip arthroplasty 06/08/2018   Hyperlipidemia    Illiteracy 03/19/2019   Irritable bowel syndrome 06/08/2018   Mixed hyperlipidemia 06/08/2018   Osteoarthritis 06/08/2018    Osteoarthritis of left knee 11/01/2022   Osteoarthritis of one hip, right 11/01/2022   Other fatigue 10/30/2022   Polyneuropathy 06/08/2018   Preoperative cardiovascular examination 10/30/2022   Presenile dementia (HCC) 06/08/2018   Prostate enlargement    Sinus bradycardia 10/30/2022   Trochanteric bursitis of right hip 06/08/2018    Past Surgical History:  Procedure Laterality Date   CYSTOSCOPY WITH INSERTION OF UROLIFT     SPINE SURGERY     TOTAL HIP ARTHROPLASTY      Current Medications: Current Meds  Medication Sig   ALPRAZolam (XANAX) 0.5 MG tablet TAKE 1 TABLET(0.5 MG) BY MOUTH DAILY AS NEEDED   aspirin EC 81 MG tablet Take 81 mg by mouth daily.   bethanechol (URECHOLINE) 25 MG tablet Take 25 mg by mouth daily.   celecoxib (CELEBREX) 200 MG capsule TAKE 1 CAPSULE(200 MG) BY MOUTH twice daily   docusate sodium (COLACE) 100 MG capsule Take 100 mg by mouth 2 (two) times daily.   ezetimibe (ZETIA) 10 MG tablet TAKE 1 TABLET(10 MG) BY MOUTH DAILY   FARXIGA 5 MG TABS tablet TAKE 1 TABLET(5 MG) BY MOUTH DAILY   furosemide (LASIX) 20 MG tablet TAKE 1 TABLET(20 MG) BY MOUTH EVERY DAY   gabapentin (NEURONTIN) 600 MG tablet TAKE 1 TABLET(600 MG) BY MOUTH THREE TIMES DAILY   glucose blood (ONETOUCH ULTRA) test strip Use as instructed   JARDIANCE 10 MG TABS tablet Take 1 tablet (10 mg total) by mouth daily.   linaclotide (LINZESS) 145 MCG CAPS capsule Take 1 capsule (145 mcg total) by mouth daily before breakfast.   lisinopril (ZESTRIL) 10 MG tablet TAKE 1 TABLET(10 MG) BY MOUTH DAILY   loratadine (CLARITIN) 10 MG tablet Take 10 mg by mouth daily as needed for allergies.   nitroGLYCERIN (NITROSTAT) 0.4 MG SL tablet Place 1 tablet (0.4 mg total) under the tongue every 5 (five) minutes as needed.   Omega-3 Fatty Acids (FISH OIL) 1000 MG CAPS Take 1 capsule by mouth daily.   omeprazole (PRILOSEC) 20 MG capsule TAKE 1 CAPSULE BY MOUTH DAILY   oxyCODONE (ROXICODONE) 15 MG immediate release  tablet Take 15 mg by mouth every 4 (four) hours as needed for pain.   rosuvastatin (CRESTOR) 20 MG tablet TAKE 1 TABLET(20 MG) BY MOUTH DAILY   Semaglutide (OZEMPIC, 0.25 OR 0.5 MG/DOSE, [place]) Inject 0.5 mg into the skin once a week.   tadalafil (CIALIS) 5 MG tablet Take 5 mg by mouth daily.   tamsulosin (FLOMAX) 0.4 MG CAPS capsule Take 0.4 mg by mouth daily.     Allergies:   Patient has no known allergies.   Social History   Socioeconomic History   Marital status: Widowed    Spouse name: Not on file   Number of children: Not on file   Years of education: Not on file   Highest education level: 9th grade  Occupational History   Not on file  Tobacco Use  Smoking status: Never   Smokeless tobacco: Never  Vaping Use   Vaping status: Never Used  Substance and Sexual Activity   Alcohol use: Never   Drug use: Never   Sexual activity: Not on file  Other Topics Concern   Not on file  Social History Narrative   Not on file   Social Determinants of Health   Financial Resource Strain: Low Risk  (06/30/2022)   Overall Financial Resource Strain (CARDIA)    Difficulty of Paying Living Expenses: Not hard at all  Food Insecurity: No Food Insecurity (06/30/2022)   Hunger Vital Sign    Worried About Running Out of Food in the Last Year: Never true    Ran Out of Food in the Last Year: Never true  Transportation Needs: No Transportation Needs (06/30/2022)   PRAPARE - Administrator, Civil Service (Medical): No    Lack of Transportation (Non-Medical): No  Physical Activity: Sufficiently Active (06/30/2022)   Exercise Vital Sign    Days of Exercise per Week: 5 days    Minutes of Exercise per Session: 60 min  Stress: No Stress Concern Present (06/30/2022)   [employer] of Occupational Health - Occupational Stress Questionnaire    Feeling of Stress : Not at all  Social Connections: Not on file     Family History: The patient's family history includes Alzheimer's disease in  his father; Cancer in his father; Diabetes in his father and mother; Heart disease in his father; Lung cancer in his mother; Stroke in his father.  ROS:   Please see the history of present illness.    All other systems reviewed and are negative.  EKGs/Labs/Other Studies Reviewed:    The following studies were reviewed today:  EKG Interpretation Date/Time:  Wednesday December 09 2022 09:50:33 EST Ventricular Rate:  60 PR Interval:  156 QRS Duration:  92 QT Interval:  422 QTC Calculation: 422 R Axis:   28  Text Interpretation: Normal sinus rhythm Incomplete right bundle branch block Cannot rule out Anterior infarct , age undetermined Abnormal ECG When compared with ECG of 25-Jun-2009 10:04, Nonspecific T wave abnormality no longer evident in Lateral leads Confirmed by Belva Crome [id number]) on 12/09/2022 8:52:31 AM     Recent Labs: 04/29/2022: TSH 1.700 10/30/2022: ALT 17; BUN 9; Creatinine, Ser 0.69; Hemoglobin 13.6; Platelets 181; Potassium 4.2; Sodium 141  Recent Lipid Panel    Component Value Date/Time   CHOL 154 10/30/2022 0827   TRIG 89 10/30/2022 0827   HDL 56 10/30/2022 0827   CHOLHDL 2.8 10/30/2022 0827   LDLCALC 81 10/30/2022 0827    Physical Exam:    VS:  BP 130/74   Pulse 60   Ht 6' (1.829 m)   Wt 299 lb 3.2 oz (135.7 kg)   SpO2 95%   BMI 40.58 kg/m     Wt Readings from Last 3 Encounters:  12/09/22 299 lb 3.2 oz (135.7 kg)  10/30/22 (!) 305 lb (138.3 kg)  06/30/22 (!) 305 lb (138.3 kg)     GEN: Patient is in no acute distress HEENT: Normal NECK: No JVD; No carotid bruits LYMPHATICS: No lymphadenopathy CARDIAC: S1 S2 regular, 2/6 systolic murmur at the apex. RESPIRATORY:  Clear to auscultation without rales, wheezing or rhonchi  ABDOMEN: Soft, non-tender, non-distended MUSCULOSKELETAL:  No edema; No deformity  SKIN: Warm and dry NEUROLOGIC:  Alert and oriented x 3 PSYCHIATRIC:  Normal affect    Signed, Garwin Brothers, MD  12/09/2022 9:22 AM  [facility]

## 2022-12-10 ENCOUNTER — Telehealth: Payer: Self-pay

## 2022-12-10 LAB — BASIC METABOLIC PANEL
BUN/Creatinine Ratio: 18 (ref 10–24)
BUN: 11 mg/dL (ref 8–27)
CO2: 29 mmol/L (ref 20–29)
Calcium: 9.4 mg/dL (ref 8.6–10.2)
Chloride: 99 mmol/L (ref 96–106)
Creatinine, Ser: 0.62 mg/dL — ABNORMAL LOW (ref 0.76–1.27)
Glucose: 95 mg/dL (ref 70–99)
Potassium: 4.6 mmol/L (ref 3.5–5.2)
Sodium: 138 mmol/L (ref 134–144)
eGFR: 107 mL/min/{1.73_m2} (ref >59)

## 2022-12-10 NOTE — Telephone Encounter (Signed)
-----   Message from Aundra Dubin Revankar sent at 12/10/2022  8:33 AM EST ----- The results of the study is unremarkable. Please inform patient. I will discuss in detail at next appointment. Cc  primary care/referring physician Garwin Brothers, MD 12/10/2022 8:33 AM

## 2022-12-10 NOTE — Telephone Encounter (Signed)
Left message for patient to call back  

## 2022-12-14 DIAGNOSIS — R3914 Feeling of incomplete bladder emptying: Secondary | ICD-10-CM | POA: Diagnosis not present

## 2022-12-14 DIAGNOSIS — K581 Irritable bowel syndrome with constipation: Secondary | ICD-10-CM | POA: Diagnosis not present

## 2022-12-14 DIAGNOSIS — Z6841 Body Mass Index (BMI) 40.0 and over, adult: Secondary | ICD-10-CM | POA: Diagnosis not present

## 2022-12-14 DIAGNOSIS — E1140 Type 2 diabetes mellitus with diabetic neuropathy, unspecified: Secondary | ICD-10-CM | POA: Diagnosis not present

## 2022-12-14 DIAGNOSIS — E7800 Pure hypercholesterolemia, unspecified: Secondary | ICD-10-CM | POA: Diagnosis not present

## 2022-12-14 DIAGNOSIS — M1712 Unilateral primary osteoarthritis, left knee: Secondary | ICD-10-CM | POA: Diagnosis not present

## 2022-12-14 DIAGNOSIS — M2170 Unequal limb length (acquired), unspecified site: Secondary | ICD-10-CM | POA: Diagnosis not present

## 2022-12-14 DIAGNOSIS — Z79899 Other long term (current) drug therapy: Secondary | ICD-10-CM | POA: Diagnosis not present

## 2022-12-14 DIAGNOSIS — N401 Enlarged prostate with lower urinary tract symptoms: Secondary | ICD-10-CM | POA: Diagnosis not present

## 2022-12-14 DIAGNOSIS — M549 Dorsalgia, unspecified: Secondary | ICD-10-CM | POA: Diagnosis not present

## 2022-12-14 DIAGNOSIS — I10 Essential (primary) hypertension: Secondary | ICD-10-CM | POA: Diagnosis not present

## 2022-12-14 DIAGNOSIS — G8918 Other acute postprocedural pain: Secondary | ICD-10-CM | POA: Diagnosis not present

## 2022-12-14 DIAGNOSIS — Z7982 Long term (current) use of aspirin: Secondary | ICD-10-CM | POA: Diagnosis not present

## 2022-12-14 DIAGNOSIS — K219 Gastro-esophageal reflux disease without esophagitis: Secondary | ICD-10-CM | POA: Diagnosis not present

## 2022-12-15 ENCOUNTER — Telehealth: Payer: Self-pay

## 2022-12-15 NOTE — Telephone Encounter (Signed)
Copied from CRM [id number]. Topic: General - Other >> Dec 15, 2022 10:30 AM Dennison Nancy wrote: Reason for CRM: ann called from [facility] requesting the last A1c date

## 2022-12-15 NOTE — Telephone Encounter (Signed)
Good Morning Brad Johnson, unfortunately I do not know who [name] is at [facility]. If possible, in the future please ask what their last name is and if they work in a specific dept and/or their extension. I did call [facility] to see if there was someone with the name [name] in same day surgery or in one of the radiographic specialties, however there was not anyone with that name there. I am unable to provide [name] with patients last A1C. Should they call back or if you know how to get in contact with her his last A1C was 10/30/2022 resulting at 6.5.

## 2022-12-16 DIAGNOSIS — Z96642 Presence of left artificial hip joint: Secondary | ICD-10-CM | POA: Diagnosis not present

## 2022-12-16 DIAGNOSIS — Z7984 Long term (current) use of oral hypoglycemic drugs: Secondary | ICD-10-CM | POA: Diagnosis not present

## 2022-12-16 DIAGNOSIS — Z6841 Body Mass Index (BMI) 40.0 and over, adult: Secondary | ICD-10-CM | POA: Diagnosis not present

## 2022-12-16 DIAGNOSIS — Z96652 Presence of left artificial knee joint: Secondary | ICD-10-CM | POA: Diagnosis not present

## 2022-12-16 DIAGNOSIS — Z791 Long term (current) use of non-steroidal anti-inflammatories (NSAID): Secondary | ICD-10-CM | POA: Diagnosis not present

## 2022-12-16 DIAGNOSIS — F32A Depression, unspecified: Secondary | ICD-10-CM | POA: Diagnosis not present

## 2022-12-16 DIAGNOSIS — N138 Other obstructive and reflux uropathy: Secondary | ICD-10-CM | POA: Diagnosis not present

## 2022-12-16 DIAGNOSIS — R3914 Feeling of incomplete bladder emptying: Secondary | ICD-10-CM | POA: Diagnosis not present

## 2022-12-16 DIAGNOSIS — I10 Essential (primary) hypertension: Secondary | ICD-10-CM | POA: Diagnosis not present

## 2022-12-16 DIAGNOSIS — K219 Gastro-esophageal reflux disease without esophagitis: Secondary | ICD-10-CM | POA: Diagnosis not present

## 2022-12-16 DIAGNOSIS — M1611 Unilateral primary osteoarthritis, right hip: Secondary | ICD-10-CM | POA: Diagnosis not present

## 2022-12-16 DIAGNOSIS — N401 Enlarged prostate with lower urinary tract symptoms: Secondary | ICD-10-CM | POA: Diagnosis not present

## 2022-12-16 DIAGNOSIS — F419 Anxiety disorder, unspecified: Secondary | ICD-10-CM | POA: Diagnosis not present

## 2022-12-16 DIAGNOSIS — Z471 Aftercare following joint replacement surgery: Secondary | ICD-10-CM | POA: Diagnosis not present

## 2022-12-16 DIAGNOSIS — K581 Irritable bowel syndrome with constipation: Secondary | ICD-10-CM | POA: Diagnosis not present

## 2022-12-16 DIAGNOSIS — R338 Other retention of urine: Secondary | ICD-10-CM | POA: Diagnosis not present

## 2022-12-16 DIAGNOSIS — N411 Chronic prostatitis: Secondary | ICD-10-CM | POA: Diagnosis not present

## 2022-12-16 DIAGNOSIS — Z7982 Long term (current) use of aspirin: Secondary | ICD-10-CM | POA: Diagnosis not present

## 2022-12-16 DIAGNOSIS — G894 Chronic pain syndrome: Secondary | ICD-10-CM | POA: Diagnosis not present

## 2022-12-16 DIAGNOSIS — E1142 Type 2 diabetes mellitus with diabetic polyneuropathy: Secondary | ICD-10-CM | POA: Diagnosis not present

## 2022-12-16 DIAGNOSIS — M549 Dorsalgia, unspecified: Secondary | ICD-10-CM | POA: Diagnosis not present

## 2022-12-16 DIAGNOSIS — Z87820 Personal history of traumatic brain injury: Secondary | ICD-10-CM | POA: Diagnosis not present

## 2022-12-16 DIAGNOSIS — E782 Mixed hyperlipidemia: Secondary | ICD-10-CM | POA: Diagnosis not present

## 2022-12-22 ENCOUNTER — Other Ambulatory Visit: Payer: Self-pay

## 2022-12-29 ENCOUNTER — Ambulatory Visit: Payer: Self-pay | Admitting: Family Medicine

## 2022-12-29 NOTE — Telephone Encounter (Addendum)
  Chief Complaint: "needs fluid pills"  Additional Notes: Due to contradictory DPR, did not attempt to call son, Herlin Cropley, back. Called pt's phone # and left a VM.   Copied from CRM [id number]. Topic: Clinical - Medication Refill >> Dec 29, 2022  9:40 AM Louie Casa B wrote: Most Recent Primary Care Visit:  Provider: [facility], KIRSTEN  Department: [facility]  Visit Type: OFFICE VISIT  Date: 10/30/2022  Medication: pt son called in and pt has knee sx last week went to a follow up apt and was told that he needs fluid pills they do not know the nae of the rx but says pt took it back in 2022 please call son stanely chapel @  [phone]  Has the patient contacted their pharmacy? Yes (Agent: If no, request that the patient contact the pharmacy for the refill. If patient does not wish to contact the pharmacy document the reason why and proceed with request.) (Agent: If yes, when and what did the pharmacy advise?)  Is this the correct pharmacy for this prescription? Yes If no, delete pharmacy and type the correct one.  This is the patient's preferred pharmacy:  [id number] [place] - [address] [place] [address] Phone: [phone] Fax: [phone]  [facility] [address], [place] - [address] [place] [address] Phone: [phone] Fax: [phone]   Has the prescription been filled recently? Yes  Is the patient out of the medication? Yes  Has the patient been seen for an appointment in the last year OR does the patient have an upcoming appointment? no  Can we respond through MyChart? No  Agent: Please be advised that Rx refills may take up to 3 business days. We ask that you follow-up with your pharmacy.

## 2022-12-30 ENCOUNTER — Other Ambulatory Visit: Payer: Self-pay | Admitting: Family Medicine

## 2022-12-30 DIAGNOSIS — M25462 Effusion, left knee: Secondary | ICD-10-CM | POA: Diagnosis not present

## 2022-12-30 DIAGNOSIS — R2689 Other abnormalities of gait and mobility: Secondary | ICD-10-CM | POA: Diagnosis not present

## 2022-12-30 DIAGNOSIS — M25562 Pain in left knee: Secondary | ICD-10-CM | POA: Diagnosis not present

## 2022-12-30 DIAGNOSIS — M25662 Stiffness of left knee, not elsewhere classified: Secondary | ICD-10-CM | POA: Diagnosis not present

## 2022-12-30 MED ORDER — FUROSEMIDE 20 MG PO TABS: 20.0000 mg | ORAL_TABLET | Freq: Every day | ORAL | 3 refills | Status: DC

## 2022-12-30 NOTE — Telephone Encounter (Signed)
Sent furosemide 20 mg daily. Dr. Sedalia Muta

## 2022-12-30 NOTE — Telephone Encounter (Signed)
I left detailed message on voicemail that prescription was sent to the pharmacy.

## 2023-01-02 DIAGNOSIS — Z96651 Presence of right artificial knee joint: Secondary | ICD-10-CM | POA: Diagnosis not present

## 2023-01-02 DIAGNOSIS — Z471 Aftercare following joint replacement surgery: Secondary | ICD-10-CM | POA: Diagnosis not present

## 2023-01-04 ENCOUNTER — Ambulatory Visit: Payer: PPO

## 2023-01-05 DIAGNOSIS — R2689 Other abnormalities of gait and mobility: Secondary | ICD-10-CM | POA: Diagnosis not present

## 2023-01-05 DIAGNOSIS — M25462 Effusion, left knee: Secondary | ICD-10-CM | POA: Diagnosis not present

## 2023-01-05 DIAGNOSIS — M25562 Pain in left knee: Secondary | ICD-10-CM | POA: Diagnosis not present

## 2023-01-05 DIAGNOSIS — M25662 Stiffness of left knee, not elsewhere classified: Secondary | ICD-10-CM | POA: Diagnosis not present

## 2023-01-06 DIAGNOSIS — M25662 Stiffness of left knee, not elsewhere classified: Secondary | ICD-10-CM | POA: Diagnosis not present

## 2023-01-06 DIAGNOSIS — M25462 Effusion, left knee: Secondary | ICD-10-CM | POA: Diagnosis not present

## 2023-01-06 DIAGNOSIS — R2689 Other abnormalities of gait and mobility: Secondary | ICD-10-CM | POA: Diagnosis not present

## 2023-01-06 DIAGNOSIS — M25562 Pain in left knee: Secondary | ICD-10-CM | POA: Diagnosis not present

## 2023-01-08 DIAGNOSIS — R2689 Other abnormalities of gait and mobility: Secondary | ICD-10-CM | POA: Diagnosis not present

## 2023-01-08 DIAGNOSIS — M25462 Effusion, left knee: Secondary | ICD-10-CM | POA: Diagnosis not present

## 2023-01-08 DIAGNOSIS — M25662 Stiffness of left knee, not elsewhere classified: Secondary | ICD-10-CM | POA: Diagnosis not present

## 2023-01-08 DIAGNOSIS — M25562 Pain in left knee: Secondary | ICD-10-CM | POA: Diagnosis not present

## 2023-01-11 DIAGNOSIS — R2689 Other abnormalities of gait and mobility: Secondary | ICD-10-CM | POA: Diagnosis not present

## 2023-01-11 DIAGNOSIS — M25662 Stiffness of left knee, not elsewhere classified: Secondary | ICD-10-CM | POA: Diagnosis not present

## 2023-01-11 DIAGNOSIS — M25562 Pain in left knee: Secondary | ICD-10-CM | POA: Diagnosis not present

## 2023-01-11 DIAGNOSIS — M25462 Effusion, left knee: Secondary | ICD-10-CM | POA: Diagnosis not present

## 2023-01-13 DIAGNOSIS — M25662 Stiffness of left knee, not elsewhere classified: Secondary | ICD-10-CM | POA: Diagnosis not present

## 2023-01-13 DIAGNOSIS — M25462 Effusion, left knee: Secondary | ICD-10-CM | POA: Diagnosis not present

## 2023-01-13 DIAGNOSIS — R2689 Other abnormalities of gait and mobility: Secondary | ICD-10-CM | POA: Diagnosis not present

## 2023-01-13 DIAGNOSIS — M25562 Pain in left knee: Secondary | ICD-10-CM | POA: Diagnosis not present

## 2023-01-15 DIAGNOSIS — R2689 Other abnormalities of gait and mobility: Secondary | ICD-10-CM | POA: Diagnosis not present

## 2023-01-15 DIAGNOSIS — M25662 Stiffness of left knee, not elsewhere classified: Secondary | ICD-10-CM | POA: Diagnosis not present

## 2023-01-15 DIAGNOSIS — M25462 Effusion, left knee: Secondary | ICD-10-CM | POA: Diagnosis not present

## 2023-01-15 DIAGNOSIS — M25562 Pain in left knee: Secondary | ICD-10-CM | POA: Diagnosis not present

## 2023-01-18 ENCOUNTER — Telehealth (HOSPITAL_COMMUNITY): Payer: Self-pay | Admitting: *Deleted

## 2023-01-18 DIAGNOSIS — R2689 Other abnormalities of gait and mobility: Secondary | ICD-10-CM | POA: Diagnosis not present

## 2023-01-18 DIAGNOSIS — M25662 Stiffness of left knee, not elsewhere classified: Secondary | ICD-10-CM | POA: Diagnosis not present

## 2023-01-18 DIAGNOSIS — M25562 Pain in left knee: Secondary | ICD-10-CM | POA: Diagnosis not present

## 2023-01-18 DIAGNOSIS — M25462 Effusion, left knee: Secondary | ICD-10-CM | POA: Diagnosis not present

## 2023-01-18 NOTE — Telephone Encounter (Signed)
Attempted to call patient regarding upcoming cardiac CT appointment. °Left message on voicemail with name and callback number ° °Edie Vallandingham RN Navigator Cardiac Imaging °[facility] °[phone] Office °[phone] Cell ° °

## 2023-01-19 ENCOUNTER — Ambulatory Visit (HOSPITAL_BASED_OUTPATIENT_CLINIC_OR_DEPARTMENT_OTHER): Admission: RE | Admit: 2023-01-19 | Payer: PPO | Source: Ambulatory Visit

## 2023-01-20 ENCOUNTER — Other Ambulatory Visit: Payer: Self-pay | Admitting: Family Medicine

## 2023-01-20 DIAGNOSIS — M25462 Effusion, left knee: Secondary | ICD-10-CM | POA: Diagnosis not present

## 2023-01-20 DIAGNOSIS — M25662 Stiffness of left knee, not elsewhere classified: Secondary | ICD-10-CM | POA: Diagnosis not present

## 2023-01-20 DIAGNOSIS — R2689 Other abnormalities of gait and mobility: Secondary | ICD-10-CM | POA: Diagnosis not present

## 2023-01-20 DIAGNOSIS — M25562 Pain in left knee: Secondary | ICD-10-CM | POA: Diagnosis not present

## 2023-01-22 DIAGNOSIS — M25662 Stiffness of left knee, not elsewhere classified: Secondary | ICD-10-CM | POA: Diagnosis not present

## 2023-01-22 DIAGNOSIS — M25462 Effusion, left knee: Secondary | ICD-10-CM | POA: Diagnosis not present

## 2023-01-22 DIAGNOSIS — R2689 Other abnormalities of gait and mobility: Secondary | ICD-10-CM | POA: Diagnosis not present

## 2023-01-22 DIAGNOSIS — M25562 Pain in left knee: Secondary | ICD-10-CM | POA: Diagnosis not present

## 2023-01-26 DIAGNOSIS — M25462 Effusion, left knee: Secondary | ICD-10-CM | POA: Diagnosis not present

## 2023-01-26 DIAGNOSIS — R2689 Other abnormalities of gait and mobility: Secondary | ICD-10-CM | POA: Diagnosis not present

## 2023-01-26 DIAGNOSIS — M25662 Stiffness of left knee, not elsewhere classified: Secondary | ICD-10-CM | POA: Diagnosis not present

## 2023-01-26 DIAGNOSIS — Z96652 Presence of left artificial knee joint: Secondary | ICD-10-CM | POA: Diagnosis not present

## 2023-01-26 DIAGNOSIS — M1712 Unilateral primary osteoarthritis, left knee: Secondary | ICD-10-CM | POA: Diagnosis not present

## 2023-01-26 DIAGNOSIS — M25562 Pain in left knee: Secondary | ICD-10-CM | POA: Diagnosis not present

## 2023-01-29 ENCOUNTER — Ambulatory Visit: Payer: PPO

## 2023-02-02 DIAGNOSIS — R2689 Other abnormalities of gait and mobility: Secondary | ICD-10-CM | POA: Diagnosis not present

## 2023-02-02 DIAGNOSIS — M25662 Stiffness of left knee, not elsewhere classified: Secondary | ICD-10-CM | POA: Diagnosis not present

## 2023-02-02 DIAGNOSIS — M25462 Effusion, left knee: Secondary | ICD-10-CM | POA: Diagnosis not present

## 2023-02-02 DIAGNOSIS — M25562 Pain in left knee: Secondary | ICD-10-CM | POA: Diagnosis not present

## 2023-02-02 NOTE — Assessment & Plan Note (Signed)
The current medical regimen is effective; continue present plan and medications. Omeprazole 20 mg daily. 

## 2023-02-02 NOTE — Assessment & Plan Note (Signed)
 Control: good Recommend check sugars fasting daily. Recommend check feet daily. Recommend annual eye exams. Medicines: Jardiance  10 mg daily, Ozempic  1 mg weekly, farxiga  5 mg daily Continue to work on eating a healthy diet and exercise.  Labs drawn today.
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
## 2023-02-02 NOTE — Assessment & Plan Note (Signed)
Well controlled.  No changes to medicines. Taking Rosuvastatin 20 mg daily, fish oil Continue to work on eating a healthy diet and exercise.  Labs drawn today.

## 2023-02-02 NOTE — Assessment & Plan Note (Signed)
Well controlled.  No changes to medicines. Lisinopril 10 mg daily, Aspirin 81 mg daily, Continue to work on eating a healthy diet and exercise.  Labs drawn today.

## 2023-02-04 ENCOUNTER — Encounter: Payer: PPO | Admitting: Family Medicine

## 2023-02-04 ENCOUNTER — Ambulatory Visit: Payer: PPO | Attending: Cardiology

## 2023-02-04 DIAGNOSIS — K219 Gastro-esophageal reflux disease without esophagitis: Secondary | ICD-10-CM

## 2023-02-04 DIAGNOSIS — E782 Mixed hyperlipidemia: Secondary | ICD-10-CM

## 2023-02-04 DIAGNOSIS — I10 Essential (primary) hypertension: Secondary | ICD-10-CM

## 2023-02-04 DIAGNOSIS — E1142 Type 2 diabetes mellitus with diabetic polyneuropathy: Secondary | ICD-10-CM

## 2023-02-04 DIAGNOSIS — I471 Supraventricular tachycardia, unspecified: Secondary | ICD-10-CM

## 2023-02-05 DIAGNOSIS — M25462 Effusion, left knee: Secondary | ICD-10-CM | POA: Diagnosis not present

## 2023-02-05 DIAGNOSIS — M25562 Pain in left knee: Secondary | ICD-10-CM | POA: Diagnosis not present

## 2023-02-05 DIAGNOSIS — M25662 Stiffness of left knee, not elsewhere classified: Secondary | ICD-10-CM | POA: Diagnosis not present

## 2023-02-05 DIAGNOSIS — R2689 Other abnormalities of gait and mobility: Secondary | ICD-10-CM | POA: Diagnosis not present

## 2023-02-05 NOTE — Progress Notes (Signed)
 No showed. Dr. Sedalia Muta

## 2023-02-06 ENCOUNTER — Other Ambulatory Visit: Payer: Self-pay | Admitting: Family Medicine

## 2023-02-07 ENCOUNTER — Other Ambulatory Visit: Payer: Self-pay | Admitting: Family Medicine

## 2023-02-09 DIAGNOSIS — M25462 Effusion, left knee: Secondary | ICD-10-CM | POA: Diagnosis not present

## 2023-02-09 DIAGNOSIS — R2689 Other abnormalities of gait and mobility: Secondary | ICD-10-CM | POA: Diagnosis not present

## 2023-02-09 DIAGNOSIS — M25662 Stiffness of left knee, not elsewhere classified: Secondary | ICD-10-CM | POA: Diagnosis not present

## 2023-02-09 DIAGNOSIS — M25562 Pain in left knee: Secondary | ICD-10-CM | POA: Diagnosis not present

## 2023-02-11 ENCOUNTER — Ambulatory Visit: Payer: PPO | Attending: Cardiology

## 2023-02-11 DIAGNOSIS — I209 Angina pectoris, unspecified: Secondary | ICD-10-CM | POA: Diagnosis not present

## 2023-02-11 DIAGNOSIS — R079 Chest pain, unspecified: Secondary | ICD-10-CM

## 2023-02-11 LAB — ECHOCARDIOGRAM COMPLETE: S' Lateral: 3.9 cm

## 2023-02-11 MED ORDER — PERFLUTREN LIPID MICROSPHERE
1.0000 mL | INTRAVENOUS | Status: AC | PRN
  Administered 2023-02-11: 7 mL via INTRAVENOUS

## 2023-02-12 DIAGNOSIS — M25662 Stiffness of left knee, not elsewhere classified: Secondary | ICD-10-CM | POA: Diagnosis not present

## 2023-02-12 DIAGNOSIS — M25562 Pain in left knee: Secondary | ICD-10-CM | POA: Diagnosis not present

## 2023-02-12 DIAGNOSIS — R2689 Other abnormalities of gait and mobility: Secondary | ICD-10-CM | POA: Diagnosis not present

## 2023-02-12 DIAGNOSIS — M25462 Effusion, left knee: Secondary | ICD-10-CM | POA: Diagnosis not present

## 2023-02-17 DIAGNOSIS — M25662 Stiffness of left knee, not elsewhere classified: Secondary | ICD-10-CM | POA: Diagnosis not present

## 2023-02-17 DIAGNOSIS — M25562 Pain in left knee: Secondary | ICD-10-CM | POA: Diagnosis not present

## 2023-02-17 DIAGNOSIS — R2689 Other abnormalities of gait and mobility: Secondary | ICD-10-CM | POA: Diagnosis not present

## 2023-02-17 DIAGNOSIS — M25462 Effusion, left knee: Secondary | ICD-10-CM | POA: Diagnosis not present

## 2023-02-19 DIAGNOSIS — M25562 Pain in left knee: Secondary | ICD-10-CM | POA: Diagnosis not present

## 2023-02-19 DIAGNOSIS — R2689 Other abnormalities of gait and mobility: Secondary | ICD-10-CM | POA: Diagnosis not present

## 2023-02-19 DIAGNOSIS — M25662 Stiffness of left knee, not elsewhere classified: Secondary | ICD-10-CM | POA: Diagnosis not present

## 2023-02-19 DIAGNOSIS — M25462 Effusion, left knee: Secondary | ICD-10-CM | POA: Diagnosis not present

## 2023-02-23 DIAGNOSIS — M25662 Stiffness of left knee, not elsewhere classified: Secondary | ICD-10-CM | POA: Diagnosis not present

## 2023-02-23 DIAGNOSIS — M25562 Pain in left knee: Secondary | ICD-10-CM | POA: Diagnosis not present

## 2023-02-23 DIAGNOSIS — M25462 Effusion, left knee: Secondary | ICD-10-CM | POA: Diagnosis not present

## 2023-02-23 DIAGNOSIS — R2689 Other abnormalities of gait and mobility: Secondary | ICD-10-CM | POA: Diagnosis not present

## 2023-02-25 DIAGNOSIS — M25562 Pain in left knee: Secondary | ICD-10-CM | POA: Diagnosis not present

## 2023-02-25 DIAGNOSIS — M25462 Effusion, left knee: Secondary | ICD-10-CM | POA: Diagnosis not present

## 2023-02-25 DIAGNOSIS — R2689 Other abnormalities of gait and mobility: Secondary | ICD-10-CM | POA: Diagnosis not present

## 2023-02-25 DIAGNOSIS — M25662 Stiffness of left knee, not elsewhere classified: Secondary | ICD-10-CM | POA: Diagnosis not present

## 2023-02-26 ENCOUNTER — Other Ambulatory Visit: Payer: Self-pay | Admitting: Family Medicine

## 2023-03-01 DIAGNOSIS — R2689 Other abnormalities of gait and mobility: Secondary | ICD-10-CM | POA: Diagnosis not present

## 2023-03-01 DIAGNOSIS — M25462 Effusion, left knee: Secondary | ICD-10-CM | POA: Diagnosis not present

## 2023-03-01 DIAGNOSIS — M25562 Pain in left knee: Secondary | ICD-10-CM | POA: Diagnosis not present

## 2023-03-01 DIAGNOSIS — M25662 Stiffness of left knee, not elsewhere classified: Secondary | ICD-10-CM | POA: Diagnosis not present

## 2023-03-02 ENCOUNTER — Ambulatory Visit (HOSPITAL_BASED_OUTPATIENT_CLINIC_OR_DEPARTMENT_OTHER)
Admission: RE | Admit: 2023-03-02 | Discharge: 2023-03-02 | Disposition: A | Discharge status: Home / Self Care | Payer: PPO | Source: Ambulatory Visit | Attending: Cardiology | Admitting: Cardiology

## 2023-03-02 ENCOUNTER — Encounter (HOSPITAL_BASED_OUTPATIENT_CLINIC_OR_DEPARTMENT_OTHER): Payer: Self-pay

## 2023-03-02 DIAGNOSIS — I259 Chronic ischemic heart disease, unspecified: Secondary | ICD-10-CM | POA: Insufficient documentation

## 2023-03-02 DIAGNOSIS — I209 Angina pectoris, unspecified: Secondary | ICD-10-CM | POA: Insufficient documentation

## 2023-03-02 DIAGNOSIS — R079 Chest pain, unspecified: Secondary | ICD-10-CM | POA: Insufficient documentation

## 2023-03-02 LAB — POCT I-STAT CREATININE: Creatinine, Ser: 0.8 mg/dL (ref 0.61–1.24)

## 2023-03-02 MED ORDER — IOHEXOL 350 MG/ML SOLN
100.0000 mL | Freq: Once | INTRAVENOUS | Status: AC | PRN
  Administered 2023-03-02: 95 mL via INTRAVENOUS

## 2023-03-02 MED ORDER — DILTIAZEM HCL 25 MG/5ML IV SOLN
INTRAVENOUS | Status: AC
  Filled 2023-03-02: qty 5

## 2023-03-02 MED ORDER — METOPROLOL TARTRATE 5 MG/5ML IV SOLN
INTRAVENOUS | Status: AC
  Filled 2023-03-02: qty 20

## 2023-03-02 MED ORDER — NITROGLYCERIN 0.4 MG SL SUBL
SUBLINGUAL_TABLET | SUBLINGUAL | Status: AC
  Administered 2023-03-02: 0.8 mg
  Filled 2023-03-02: qty 1

## 2023-03-02 NOTE — Progress Notes (Signed)
Pt tolerated exam without incident; vital signs within normal limits; pt denies lightheadedness or dizziness; encouraged to drink caffeine, eat meal; pt ambulatory to lobby steady gait noted

## 2023-03-04 DIAGNOSIS — M25462 Effusion, left knee: Secondary | ICD-10-CM | POA: Diagnosis not present

## 2023-03-04 DIAGNOSIS — M25662 Stiffness of left knee, not elsewhere classified: Secondary | ICD-10-CM | POA: Diagnosis not present

## 2023-03-04 DIAGNOSIS — R2689 Other abnormalities of gait and mobility: Secondary | ICD-10-CM | POA: Diagnosis not present

## 2023-03-04 DIAGNOSIS — M25562 Pain in left knee: Secondary | ICD-10-CM | POA: Diagnosis not present

## 2023-03-08 ENCOUNTER — Other Ambulatory Visit: Payer: Self-pay | Admitting: Family Medicine

## 2023-03-09 ENCOUNTER — Telehealth: Payer: Self-pay | Admitting: Cardiology

## 2023-03-09 DIAGNOSIS — M1712 Unilateral primary osteoarthritis, left knee: Secondary | ICD-10-CM | POA: Diagnosis not present

## 2023-03-09 DIAGNOSIS — E782 Mixed hyperlipidemia: Secondary | ICD-10-CM

## 2023-03-09 DIAGNOSIS — Z96652 Presence of left artificial knee joint: Secondary | ICD-10-CM | POA: Diagnosis not present

## 2023-03-09 DIAGNOSIS — R079 Chest pain, unspecified: Secondary | ICD-10-CM

## 2023-03-09 MED ORDER — EZETIMIBE 10 MG PO TABS: 10.0000 mg | ORAL_TABLET | Freq: Every day | ORAL | 3 refills | Status: DC

## 2023-03-09 NOTE — Telephone Encounter (Signed)
 Patient is returning call to discuss CT results.

## 2023-03-09 NOTE — Telephone Encounter (Signed)
-----   Message from Redell Leiter sent at 03/05/2023 12:55 PM EST ----- CTA is abnormal  The measure of atherosclerosis coronary calcium  score is quite high in general he like to see his lipids achieve a target of an LDL of 55 or less I think he needs to take a second medication not uncommon with diabetes and I would like to see him start taking Zetia  10 mg daily along with his statin.  We can put him in recall for a lipid profile LP(a) and ApoB 2 months to see if he is at target  My concern is he has a stenosis in the left anterior descending coronary artery and because of the heavy coronary calcification we could not do a flow reserve to measure to see if flow was reduced at rest  This is one of the few situations that I think having another test to specifically look at blood flow in that artery be helpful in making decisions as he is having chest pain and I would like to see him scheduled for treadmill Myoview  in our office and be sure he is follow-up with Dr. Edwyna in the first few weeks when he returns.

## 2023-03-09 NOTE — Telephone Encounter (Signed)
Results reviewed with pt as per Dr. Hulen Shouts note.  Pt verbalized understanding and had no additional questions. Routed to PCP  Instructions reviewed with pt for stress test. Pt verbalized understanding.

## 2023-03-11 DIAGNOSIS — M25562 Pain in left knee: Secondary | ICD-10-CM | POA: Diagnosis not present

## 2023-03-11 DIAGNOSIS — M25462 Effusion, left knee: Secondary | ICD-10-CM | POA: Diagnosis not present

## 2023-03-11 DIAGNOSIS — M25662 Stiffness of left knee, not elsewhere classified: Secondary | ICD-10-CM | POA: Diagnosis not present

## 2023-03-11 DIAGNOSIS — R2689 Other abnormalities of gait and mobility: Secondary | ICD-10-CM | POA: Diagnosis not present

## 2023-03-15 ENCOUNTER — Other Ambulatory Visit: Payer: Self-pay | Admitting: Family Medicine

## 2023-03-15 ENCOUNTER — Telehealth: Payer: Self-pay | Admitting: Family Medicine

## 2023-03-15 DIAGNOSIS — E782 Mixed hyperlipidemia: Secondary | ICD-10-CM

## 2023-03-15 MED ORDER — EZETIMIBE 10 MG PO TABS: 10.0000 mg | ORAL_TABLET | Freq: Every day | ORAL | 3 refills | Status: DC

## 2023-03-15 MED ORDER — JARDIANCE 10 MG PO TABS: 10.0000 mg | ORAL_TABLET | Freq: Every day | ORAL | 2 refills | Status: DC

## 2023-03-15 NOTE — Telephone Encounter (Signed)
 Patient is called and he advised that it is the antibiotic Levofloxacin  antibiotic.  He states that he used to see Dr Elvin Hammer at this PCP office and he was being prescribed this medication  He states that he had a procedure years ago and states that essentially the prostate "doesn't like it" from being "stapled" (prostate procedure).  It was a situation where he starts to have some symptoms--odor, slowing down the urine stream. Patient states that with these bands a lot of people had problems with this banding procedure.  He said it doesn't flare up much.  He states that urinary screens never really showed infection but he would do them if needed. He has had the antibiotic on hand and whenever he has a "flare up" which he is familiar with and he would take this antibiotic for 3 days and he would be back to normal.  He states that a whole bottle of 30 tablets lasted him almost a year.  Patient states that he has gone through this process so many times but if an appointment is required again patient states that he will come  He states that the prescription has lasted about a year and Dr Reinhold Carbine had prescribed it last time.  Patient states that he has an appointment this Thursday with Dr Reinhold Carbine but if she is okay with sending in this prescription beforehand to prevent it from getting bad he would like that.  He states he doesn't feel bad but he can tell that this is the beginning.  Patient states that Dr Reinhold Carbine is familiar with this situation and he explained the entire situation to her with the procedure and having antibiotics on hand for the past 10-15 years. Patient would like to hear back about if he needs to come in sooner or if the prescription could be sent in sooner.  Patient is advised by this RN however that he gets worse at all to go to the emergency room. Patient verbalized understanding.

## 2023-03-15 NOTE — Telephone Encounter (Unsigned)
 Copied from CRM [id number]. Topic: Clinical - Medication Refill >> Mar 15, 2023  9:47 AM Tisa Forester wrote: Most Recent Primary Care Visit:  [facility]  Department[facility]  Visit Type: CLINICAL SUPPORT  Date: 01/04/2023  Medication: Larxiea 5mg , medication number [id number] 5mg  30 tablets  Has the patient contacted their pharmacy? Yes (Agent: If no, request that the patient contact the pharmacy for the refill. If patient does not wish to contact the pharmacy document the reason why and proceed with request.) (Agent: If yes, when and what did the pharmacy advise?)  Is this the correct pharmacy for this prescription? Yes If no, delete pharmacy and type the correct one.  This is the patient's preferred pharmacy:  [id number] [place] - [address] AT [address] [place] [address] Phone: [phone] Fax: [phone]   Has the prescription been filled recently? no  Is the patient out of the medication? Yes  Has the patient been seen for an appointment in the last year OR does the patient have an upcoming appointment? Yes  Can we respond through MyChart? No  Agent: Please be advised that Rx refills may take up to 3 business days. We ask that you follow-up with your pharmacy.

## 2023-03-15 NOTE — Telephone Encounter (Signed)
 Copied from CRM [id number]. Topic: Clinical - Medication Refill >> Mar 15, 2023  9:40 AM Tisa Forester wrote: Most Recent Primary Care Visit:  [facility]  Department[facility]  Visit Type: CLINICAL SUPPORT  Date: 01/04/2023  Medication: JARDIANCE  10 MG TABS tablet, ezetimibe  (ZETIA ) 10 MG tablet,  Has the patient contacted their pharmacy? Yes (Agent: If no, request that the patient contact the pharmacy for the refill. If patient does not wish to contact the pharmacy document the reason why and proceed with request.) (Agent: If yes, when and what did the pharmacy advise?)  Is this the correct pharmacy for this prescription? Yes If no, delete pharmacy and type the correct one.  This is the patient's preferred pharmacy:  [id number] [place] - [address] [place] [address] Phone: [phone] Fax: [phone]   Has the prescription been filled recently? No  Is the patient out of the medication? Yes want to know if his refills can automatically be call in so he do not have to call   Has the patient been seen for an appointment in the last year OR does the patient have an upcoming appointment? Yes  Can we respond through MyChart? No  Agent: Please be advised that Rx refills may take up to 3 business days. We ask that you follow-up with your pharmacy.

## 2023-03-16 ENCOUNTER — Other Ambulatory Visit: Payer: Self-pay | Admitting: Family Medicine

## 2023-03-16 MED ORDER — LEVOFLOXACIN 500 MG PO TABS: ORAL_TABLET | ORAL | 0 refills | Status: DC

## 2023-03-17 ENCOUNTER — Telehealth (HOSPITAL_COMMUNITY): Payer: Self-pay | Admitting: *Deleted

## 2023-03-17 NOTE — Telephone Encounter (Signed)
Patient given detailed instructions per Myocardial Perfusion Study Information Sheet for the test on 03/24/2023 at 11:30. Patient notified to arrive 15 minutes early and that it is imperative to arrive on time for appointment to keep from having the test rescheduled.  If you need to cancel or reschedule your appointment, please call the office within 24 hours of your appointment. . Patient verbalized understanding.Daneil Dolin

## 2023-03-18 ENCOUNTER — Encounter: Payer: Self-pay | Admitting: Family Medicine

## 2023-03-18 ENCOUNTER — Ambulatory Visit (INDEPENDENT_AMBULATORY_CARE_PROVIDER_SITE_OTHER): Payer: PPO | Admitting: Family Medicine

## 2023-03-18 VITALS — BP 128/78 | HR 77 | Temp 97.6°F | Ht 73.8 in | Wt 293.0 lb

## 2023-03-18 DIAGNOSIS — N138 Other obstructive and reflux uropathy: Secondary | ICD-10-CM

## 2023-03-18 DIAGNOSIS — K588 Other irritable bowel syndrome: Secondary | ICD-10-CM

## 2023-03-18 DIAGNOSIS — E66812 Obesity, class 2: Secondary | ICD-10-CM

## 2023-03-18 DIAGNOSIS — G629 Polyneuropathy, unspecified: Secondary | ICD-10-CM

## 2023-03-18 DIAGNOSIS — Z6837 Body mass index (BMI) 37.0-37.9, adult: Secondary | ICD-10-CM

## 2023-03-18 DIAGNOSIS — I10 Essential (primary) hypertension: Secondary | ICD-10-CM | POA: Diagnosis not present

## 2023-03-18 DIAGNOSIS — E1142 Type 2 diabetes mellitus with diabetic polyneuropathy: Secondary | ICD-10-CM | POA: Diagnosis not present

## 2023-03-18 DIAGNOSIS — N401 Enlarged prostate with lower urinary tract symptoms: Secondary | ICD-10-CM

## 2023-03-18 DIAGNOSIS — E782 Mixed hyperlipidemia: Secondary | ICD-10-CM | POA: Diagnosis not present

## 2023-03-18 MED ORDER — LEVOFLOXACIN 500 MG PO TABS: ORAL_TABLET | ORAL | 0 refills | Status: DC

## 2023-03-18 NOTE — Patient Instructions (Signed)
Stop farxiga. Continue jardiance.
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
## 2023-03-19 LAB — COMPREHENSIVE METABOLIC PANEL
ALT: 16 [IU]/L (ref 0–44)
AST: 18 [IU]/L (ref 0–40)
Albumin: 4.6 g/dL (ref 3.9–4.9)
Alkaline Phosphatase: 81 [IU]/L (ref 44–121)
BUN/Creatinine Ratio: 15 (ref 10–24)
BUN: 12 mg/dL (ref 8–27)
Bilirubin Total: 0.4 mg/dL (ref 0.0–1.2)
CO2: 24 mmol/L (ref 20–29)
Calcium: 9.6 mg/dL (ref 8.6–10.2)
Chloride: 100 mmol/L (ref 96–106)
Creatinine, Ser: 0.81 mg/dL (ref 0.76–1.27)
Globulin, Total: 2.6 g/dL (ref 1.5–4.5)
Glucose: 98 mg/dL (ref 70–99)
Potassium: 4.9 mmol/L (ref 3.5–5.2)
Sodium: 141 mmol/L (ref 134–144)
Total Protein: 7.2 g/dL (ref 6.0–8.5)
eGFR: 99 mL/min/{1.73_m2} (ref >59)

## 2023-03-19 LAB — CBC WITH DIFFERENTIAL/PLATELET
Basophils Absolute: 0 10*3/uL (ref 0.0–0.2)
Basos: 1 %
EOS (ABSOLUTE): 0.1 10*3/uL (ref 0.0–0.4)
Eos: 4 %
Hematocrit: 46.5 % (ref 37.5–51.0)
Hemoglobin: 15.4 g/dL (ref 13.0–17.7)
Immature Grans (Abs): 0 10*3/uL (ref 0.0–0.1)
Immature Granulocytes: 0 %
Lymphocytes Absolute: 1.7 10*3/uL (ref 0.7–3.1)
Lymphs: 54 %
MCH: 30 pg (ref 26.6–33.0)
MCHC: 33.1 g/dL (ref 31.5–35.7)
MCV: 91 fL (ref 79–97)
Monocytes Absolute: 0.3 10*3/uL (ref 0.1–0.9)
Monocytes: 11 %
Neutrophils Absolute: 0.9 10*3/uL — ABNORMAL LOW (ref 1.4–7.0)
Neutrophils: 30 %
Platelets: 197 10*3/uL (ref 150–450)
RBC: 5.14 x10E6/uL (ref 4.14–5.80)
RDW: 12.3 % (ref 11.6–15.4)
WBC: 3.1 10*3/uL — ABNORMAL LOW (ref 3.4–10.8)

## 2023-03-19 LAB — LIPID PANEL
Chol/HDL Ratio: 3 {ratio} (ref 0.0–5.0)
Cholesterol, Total: 151 mg/dL (ref 100–199)
HDL: 51 mg/dL (ref >39)
LDL Chol Calc (NIH): 78 mg/dL (ref 0–99)
Triglycerides: 127 mg/dL (ref 0–149)
VLDL Cholesterol Cal: 22 mg/dL (ref 5–40)

## 2023-03-19 LAB — HEMOGLOBIN A1C
Est. average glucose Bld gHb Est-mCnc: 131 mg/dL
Hgb A1c MFr Bld: 6.2 % — ABNORMAL HIGH (ref 4.8–5.6)

## 2023-03-19 LAB — MICROALBUMIN / CREATININE URINE RATIO
Creatinine, Urine: 59.5 mg/dL
Microalb/Creat Ratio: <5 mg/g{creat} (ref 0–29)
Microalbumin, Urine: <3 ug/mL

## 2023-03-21 ENCOUNTER — Encounter: Payer: Self-pay | Admitting: Family Medicine

## 2023-03-21 NOTE — Assessment & Plan Note (Signed)
Control: good Recommend check sugars fasting daily. Recommend check feet daily. Recommend annual eye exams. Medicines: Ozempic 1 mg weekly, farxiga 5 mg daily. Stop farxiga. Continue jardiance. Continue to work on eating a healthy diet and exercise.  Labs drawn today.

## 2023-03-21 NOTE — Assessment & Plan Note (Signed)
 Recommend continue to work on eating healthy diet and exercise. Comorbidities: diabetes

## 2023-03-21 NOTE — Assessment & Plan Note (Signed)
Levaquin as needed for acute onset of severe nocturia.

## 2023-03-21 NOTE — Assessment & Plan Note (Signed)
 The current medical regimen is effective;  continue present plan and medications.

## 2023-03-21 NOTE — Assessment & Plan Note (Signed)
Well controlled.  No changes to medicines. Lisinopril 10 mg daily, Aspirin 81 mg daily, Continue to work on eating a healthy diet and exercise.  Labs drawn today.

## 2023-03-21 NOTE — Assessment & Plan Note (Signed)
Well controlled.  No changes to medicines. Taking Rosuvastatin 20 mg daily, fish oil Continue to work on eating a healthy diet and exercise.  Labs drawn today.

## 2023-03-22 DIAGNOSIS — Z79891 Long term (current) use of opiate analgesic: Secondary | ICD-10-CM | POA: Diagnosis not present

## 2023-03-22 DIAGNOSIS — Z96652 Presence of left artificial knee joint: Secondary | ICD-10-CM | POA: Diagnosis not present

## 2023-03-22 DIAGNOSIS — M5459 Other low back pain: Secondary | ICD-10-CM | POA: Diagnosis not present

## 2023-03-22 DIAGNOSIS — G894 Chronic pain syndrome: Secondary | ICD-10-CM | POA: Diagnosis not present

## 2023-03-22 DIAGNOSIS — Z96642 Presence of left artificial hip joint: Secondary | ICD-10-CM | POA: Diagnosis not present

## 2023-03-24 ENCOUNTER — Ambulatory Visit: Payer: PPO

## 2023-03-25 ENCOUNTER — Ambulatory Visit: Payer: PPO

## 2023-04-02 ENCOUNTER — Other Ambulatory Visit: Payer: Self-pay | Admitting: Family Medicine

## 2023-04-02 DIAGNOSIS — E1142 Type 2 diabetes mellitus with diabetic polyneuropathy: Secondary | ICD-10-CM

## 2023-04-05 ENCOUNTER — Other Ambulatory Visit: Payer: Self-pay | Admitting: Family Medicine

## 2023-04-06 ENCOUNTER — Ambulatory Visit: Payer: PPO | Attending: Cardiology

## 2023-04-06 DIAGNOSIS — R079 Chest pain, unspecified: Secondary | ICD-10-CM

## 2023-04-06 MED ORDER — REGADENOSON 0.4 MG/5ML IV SOLN
0.4000 mg | Freq: Once | INTRAVENOUS | Status: AC
  Administered 2023-04-06: 0.4 mg via INTRAVENOUS

## 2023-04-06 MED ORDER — TECHNETIUM TC 99M TETROFOSMIN IV KIT
32.4000 | PACK | Freq: Once | INTRAVENOUS | Status: AC | PRN
  Administered 2023-04-06: 32.4 via INTRAVENOUS

## 2023-04-07 ENCOUNTER — Ambulatory Visit: Payer: PPO | Attending: Cardiology

## 2023-04-07 LAB — MYOCARDIAL PERFUSION IMAGING
LV dias vol: 155 mL (ref 62–150)
LV sys vol: 59 mL
Nuc Stress EF: 62 %
Peak HR: 98 {beats}/min
Rest HR: 67 {beats}/min
Rest Nuclear Isotope Dose: 32.7 mCi
SDS: 1
SRS: 0
SSS: 1
Stress Nuclear Isotope Dose: 32.4 mCi
TID: 0.98

## 2023-04-07 MED ORDER — TECHNETIUM TC 99M TETROFOSMIN IV KIT
32.7000 | PACK | Freq: Once | INTRAVENOUS | Status: AC | PRN
  Administered 2023-04-07: 32.7 via INTRAVENOUS

## 2023-04-09 ENCOUNTER — Telehealth: Payer: Self-pay

## 2023-04-09 NOTE — Telephone Encounter (Signed)
 Left vm to return call.

## 2023-04-09 NOTE — Telephone Encounter (Signed)
-----   Message from Aundra Dubin Revankar sent at 04/07/2023  4:34 PM EST ----- The results of the study is unremarkable. Please inform patient. I will discuss in detail at next appointment. Cc  primary care/referring physician Garwin Brothers, MD 04/07/2023 4:34 PM

## 2023-04-26 ENCOUNTER — Other Ambulatory Visit: Payer: Self-pay | Admitting: Family Medicine

## 2023-04-26 DIAGNOSIS — I10 Essential (primary) hypertension: Secondary | ICD-10-CM

## 2023-04-27 DIAGNOSIS — R339 Retention of urine, unspecified: Secondary | ICD-10-CM | POA: Diagnosis not present

## 2023-04-27 DIAGNOSIS — N401 Enlarged prostate with lower urinary tract symptoms: Secondary | ICD-10-CM | POA: Diagnosis not present

## 2023-04-27 DIAGNOSIS — N411 Chronic prostatitis: Secondary | ICD-10-CM | POA: Diagnosis not present

## 2023-05-07 ENCOUNTER — Other Ambulatory Visit: Payer: Self-pay | Admitting: Physician Assistant

## 2023-05-07 ENCOUNTER — Other Ambulatory Visit: Payer: Self-pay | Admitting: Family Medicine

## 2023-05-07 DIAGNOSIS — G629 Polyneuropathy, unspecified: Secondary | ICD-10-CM

## 2023-05-07 DIAGNOSIS — E782 Mixed hyperlipidemia: Secondary | ICD-10-CM

## 2023-05-08 ENCOUNTER — Other Ambulatory Visit: Payer: Self-pay | Admitting: Family Medicine

## 2023-05-09 ENCOUNTER — Other Ambulatory Visit: Payer: Self-pay

## 2023-06-03 DIAGNOSIS — M1711 Unilateral primary osteoarthritis, right knee: Secondary | ICD-10-CM | POA: Diagnosis not present

## 2023-07-08 ENCOUNTER — Ambulatory Visit

## 2023-07-08 VITALS — Ht 72.0 in | Wt 299.0 lb

## 2023-07-08 DIAGNOSIS — Z0000 Encounter for general adult medical examination without abnormal findings: Secondary | ICD-10-CM | POA: Diagnosis not present

## 2023-07-08 NOTE — Progress Notes (Signed)
 Subjective:   Brad Johnson is a 63 y.o. who presents for a Medicare Wellness preventive visit.  As a reminder, Annual Wellness Visits don't include a physical exam, and some assessments may be limited, especially if this visit is performed virtually. We may recommend an in-person follow-up visit with your provider if needed.  Visit Complete: Virtual I connected with  Brad Johnson on 07/08/23 by a audio enabled telemedicine application and verified that I am speaking with the correct person using two identifiers.  Patient Location: Home  Provider Location: Home Office  I discussed the limitations of evaluation and management by telemedicine. The patient expressed understanding and agreed to proceed.  Vital Signs: Because this visit was a virtual/telehealth visit, some criteria may be missing or patient reported. Any vitals not documented were not able to be obtained and vitals that have been documented are patient reported.  VideoDeclined- This patient declined Librarian, academic. Therefore the visit was completed with audio only.  Persons Participating in Visit: Patient.  AWV Questionnaire: No: Patient Medicare AWV questionnaire was not completed prior to this visit.  Cardiac Risk Factors include: advanced age (>48men, >76 women);diabetes mellitus;male gender;hypertension     Objective:     Today's Vitals   07/08/23 0849  Weight: 299 lb (135.6 kg)  Height: 6' (1.829 m)   Body mass index is 40.55 kg/m.     07/08/2023    9:12 AM 06/08/2018   10:31 AM  Advanced Directives  Does Patient Have a Medical Advance Directive? No Yes  Type of Advance Directive  Healthcare Power of Attorney  Does patient want to make changes to medical advance directive?  No - Guardian declined  Copy of Healthcare Power of Attorney in Chart?  No - copy requested  Would patient like information on creating a medical advance directive? Yes (MAU/Ambulatory/Procedural  Areas - Information given)     Current Medications (verified) Outpatient Encounter Medications as of 07/08/2023  Medication Sig   Acetaminophen  (TYLENOL  8 HOUR PO) Take by mouth.   ALPRAZolam  (XANAX ) 0.5 MG tablet TAKE 1 TABLET(0.5 MG) BY MOUTH DAILY AS NEEDED   aspirin EC 81 MG tablet Take 81 mg by mouth daily.   bethanechol (URECHOLINE) 25 MG tablet Take 25 mg by mouth daily.   celecoxib  (CELEBREX ) 200 MG capsule TAKE 1 CAPSULE(200 MG) BY MOUTH twice daily   docusate sodium (COLACE) 100 MG capsule Take 100 mg by mouth 2 (two) times daily.   ezetimibe  (ZETIA ) 10 MG tablet Take 1 tablet (10 mg total) by mouth daily.   furosemide  (LASIX ) 20 MG tablet Take 1 tablet (20 mg total) by mouth daily.   gabapentin  (NEURONTIN ) 600 MG tablet TAKE 1 TABLET(600 MG) BY MOUTH THREE TIMES DAILY   glucose blood (ONETOUCH ULTRA) test strip Use as instructed   JARDIANCE  10 MG TABS tablet Take 1 tablet (10 mg total) by mouth daily.   levofloxacin  (LEVAQUIN ) 500 MG tablet TAKE 1 TABLET BY MOUTH DAILY FOR 5 DAYS as needed WITH UROLIFT INFECTION.   linaclotide  (LINZESS ) 145 MCG CAPS capsule Take 1 capsule (145 mcg total) by mouth daily before breakfast.   lisinopril  (ZESTRIL ) 10 MG tablet TAKE 1 TABLET(10 MG) BY MOUTH DAILY   Omega-3 Fatty Acids (FISH OIL) 1000 MG CAPS Take 1 capsule by mouth daily.   omeprazole (PRILOSEC) 20 MG capsule TAKE 1 CAPSULE BY MOUTH DAILY   ondansetron  (ZOFRAN ) 8 MG tablet Take 8 mg by mouth every 8 (eight) hours as needed  for nausea or vomiting.   oxyCODONE (ROXICODONE) 15 MG immediate release tablet Take 15 mg by mouth every 4 (four) hours as needed for pain.   OZEMPIC , 1 MG/DOSE, 4 MG/3ML SOPN INJECT 1 MG UNDER THE SKIN ONCE A WEEK   rosuvastatin  (CRESTOR ) 20 MG tablet TAKE 1 TABLET(20 MG) BY MOUTH DAILY   tadalafil (CIALIS) 5 MG tablet Take 5 mg by mouth daily.   No facility-administered encounter medications on file as of 07/08/2023.    Allergies (verified) Patient has no known  allergies.   History: Past Medical History:  Diagnosis Date   Adjustment disorder with depressed mood 03/19/2019   Arthritis    BMI 39.0-39.9,adult 08/27/2021   CHI (closed head injury), sequela 03/19/2019   Class 3 severe obesity due to excess calories with serious comorbidity and body mass index (BMI) of 40.0 to 44.9 in adult 11/01/2022   Depression    Diabetic polyneuropathy (HCC) 03/19/2019   Disc displacement, lumbar 06/08/2018   Dysthymic disorder 06/08/2018   Encounter for long-term use of opiate analgesic 03/19/2019   Enlarged prostate with urinary obstruction 06/08/2018   Esophageal reflux 06/08/2018   Essential hypertension, benign 06/08/2018   History of total left hip arthroplasty 06/08/2018   Hyperlipidemia    Illiteracy 03/19/2019   Irritable bowel syndrome 06/08/2018   Mixed hyperlipidemia 06/08/2018   Osteoarthritis 06/08/2018   Osteoarthritis of left knee 11/01/2022   Osteoarthritis of one hip, right 11/01/2022   Other fatigue 10/30/2022   Polyneuropathy 06/08/2018   Preoperative cardiovascular examination 10/30/2022   Presenile dementia (HCC) 06/08/2018   Prostate enlargement    Sinus bradycardia 10/30/2022   Trochanteric bursitis of right hip 06/08/2018   Past Surgical History:  Procedure Laterality Date   CYSTOSCOPY WITH INSERTION OF UROLIFT     SPINE SURGERY     TOTAL HIP ARTHROPLASTY     TOTAL KNEE ARTHROPLASTY Left 12/2022   Family History  Problem Relation Age of Onset   Diabetes Mother    Lung cancer Mother    Diabetes Father    Cancer Father    Alzheimer's disease Father    Heart disease Father    Stroke Father    Social History   Socioeconomic History   Marital status: Widowed    Spouse name: Not on file   Number of children: Not on file   Years of education: Not on file   Highest education level: 9th grade  Occupational History   Not on file  Tobacco Use   Smoking status: Never   Smokeless tobacco: Never  Vaping Use   Vaping  status: Never Used  Substance and Sexual Activity   Alcohol use: Never   Drug use: Never   Sexual activity: Not on file  Other Topics Concern   Not on file  Social History Narrative   Not on file   Social Drivers of Health   Financial Resource Strain: Low Risk  (07/08/2023)   Overall Financial Resource Strain (CARDIA)    Difficulty of Paying Living Expenses: Not hard at all  Food Insecurity: No Food Insecurity (07/08/2023)   Hunger Vital Sign    Worried About Running Out of Food in the Last Year: Never true    Ran Out of Food in the Last Year: Never true  Transportation Needs: No Transportation Needs (07/08/2023)   PRAPARE - Administrator, Civil Service (Medical): No    Lack of Transportation (Non-Medical): No  Physical Activity: Insufficiently Active (07/08/2023)   Exercise Vital  Sign    Days of Exercise per Week: 3 days    Minutes of Exercise per Session: 30 min  Stress: No Stress Concern Present (07/08/2023)   [employer] of Occupational Health - Occupational Stress Questionnaire    Feeling of Stress : Not at all  Social Connections: Moderately Isolated (07/08/2023)   Social Connection and Isolation Panel [NHANES]    Frequency of Communication with Friends and Family: More than three times a week    Frequency of Social Gatherings with Friends and Family: More than three times a week    Attends Religious Services: Never    Database administrator or Organizations: No    Attends Engineer, structural: Never    Marital Status: Married    Tobacco Counseling Counseling given: Not Answered    Clinical Intake:  Pre-visit preparation completed: Yes  Pain : No/denies pain  Diabetes: Yes CBG done?: No Did pt. bring in CBG monitor from home?: No  Lab Results  Component Value Date   HGBA1C 6.2 (H) 03/18/2023   HGBA1C 6.5 (H) 10/30/2022   HGBA1C 6.0 (H) 04/29/2022     How often do you need to have someone help you when you read instructions, pamphlets,  or other written materials from your doctor or pharmacy?: 1 - Never  Interpreter Needed?: No  Information entered by :: Seabron Cypress LPN   Activities of Daily Living     07/08/2023    9:06 AM  In your present state of health, do you have any difficulty performing the following activities:  Hearing? 0  Vision? 0  Difficulty concentrating or making decisions? 0  Walking or climbing stairs? 0  Dressing or bathing? 0  Doing errands, shopping? 0  Preparing Food and eating ? N  Using the Toilet? N  In the past six months, have you accidently leaked urine? N  Do you have problems with loss of bowel control? N  Managing your Medications? N  Managing your Finances? N  Housekeeping or managing your Housekeeping? N    Patient Care Team: Mercy Stall, MD as PCP - General (Family Medicine) Devon Fogo, [facility] (Inactive) (Pharmacist) Adelaide Adjutant, MD as Consulting Physician (Physical Medicine and Rehabilitation) Guss Legacy, FNP as Nurse Practitioner (Urology) Misenheimer, Emeterio Hansen, MD as Consulting Physician (Gastroenterology)  I have updated your Care Teams any recent Medical Services you may have received from other providers in the past year.     Assessment:    This is a routine wellness examination for Brad Johnson.  Hearing/Vision screen Hearing Screening - Comments:: Denies hearing difficulties   Vision Screening - Comments:: No vision problems; will schedule routine eye exam soon     Goals Addressed             This Visit's Progress    Increase physical activity   On track      Depression Screen     07/08/2023    9:11 AM 10/30/2022    7:38 AM 06/30/2022    8:13 AM 06/30/2022    8:11 AM 04/14/2021    7:27 AM 09/04/2020   10:55 AM 11/17/2019    7:50 AM  PHQ 2/9 Scores  PHQ - 2 Score 0 0 0 0 0 0 0  PHQ- 9 Score       0    Fall Risk     07/08/2023    9:12 AM 10/30/2022    7:38 AM 06/30/2022    8:13 AM 04/14/2021    7:27 AM  07/18/2019    7:40 AM  Fall Risk    Falls in the past year? 0 1 0 0 0  Number falls in past yr: 0 1 0 0 0  Injury with Fall? 0 1 0 0 0  Risk for fall due to : No Fall Risks Impaired mobility No Fall Risks    Follow up Falls prevention discussed;Education provided;Falls evaluation completed Falls evaluation completed;Follow up appointment Falls evaluation completed  Falls evaluation completed    MEDICARE RISK AT HOME:  Medicare Risk at Home Any stairs in or around the home?: No If so, are there any without handrails?: No Home free of loose throw rugs in walkways, pet beds, electrical cords, etc?: Yes Adequate lighting in your home to reduce risk of falls?: Yes Life alert?: No Use of a cane, walker or w/c?: No Grab bars in the bathroom?: Yes Shower chair or bench in shower?: No Elevated toilet seat or a handicapped toilet?: Yes  TIMED UP AND GO:  Was the test performed?  No  Cognitive Function: 6CIT completed        07/08/2023    9:12 AM 06/30/2022    8:14 AM  6CIT Screen  What Year? 0 points 0 points  What month? 0 points 0 points  What time? 0 points 0 points  Count back from 20 0 points 0 points  Months in reverse 0 points 0 points  Repeat phrase 0 points 0 points  Total Score 0 points 0 points    Immunizations Immunization History  Administered Date(s) Administered   Influenza Inj Mdck Quad Pf 11/14/2018, 11/17/2019, 12/10/2020, 04/29/2022   Influenza, Mdck, Trivalent,PF 6+ MOS(egg free) 10/30/2022   Influenza, Quadrivalent, Recombinant, Inj, Pf 02/03/2017    Screening Tests Health Maintenance  Topic Date Due   COVID-19 Vaccine (1) Never done   Zoster Vaccines- Shingrix (1 of 2) Never done   Colonoscopy  Never done   OPHTHALMOLOGY EXAM  11/13/2022   FOOT EXAM  04/29/2023   DTaP/Tdap/Td (1 - Tdap) 03/17/2024 (Originally 08/28/1979)   Pneumococcal Vaccine 28-56 Years old (1 of 2 - PCV) 03/17/2024 (Originally 08/28/1979)   INFLUENZA VACCINE  09/03/2023   HEMOGLOBIN A1C  09/15/2023   Diabetic  kidney evaluation - eGFR measurement  03/17/2024   Diabetic kidney evaluation - Urine ACR  03/17/2024   Medicare Annual Wellness (AWV)  07/07/2024   Hepatitis C Screening  Completed   HIV Screening  Completed   HPV VACCINES  Aged Out   Meningococcal B Vaccine  Aged Out    Health Maintenance  Health Maintenance Due  Topic Date Due   COVID-19 Vaccine (1) Never done   Zoster Vaccines- Shingrix (1 of 2) Never done   Colonoscopy  Never done   OPHTHALMOLOGY EXAM  11/13/2022   FOOT EXAM  04/29/2023   Health Maintenance Items Addressed: Patient declines colon screening and vaccines at this time   Additional Screening:  Vision Screening: Recommended annual ophthalmology exams for early detection of glaucoma and other disorders of the eye. Would you like a referral to an eye doctor? No    Dental Screening: Recommended annual dental exams for proper oral hygiene  Community Resource Referral / Chronic Care Management: CRR required this visit?  No   CCM required this visit?  No   Plan:    I have personally reviewed and noted the following in the patient's chart:   Medical and social history Use of alcohol, tobacco or illicit drugs  Current medications and supplements including  opioid prescriptions. Patient is not currently taking opioid prescriptions. Functional ability and status Nutritional status Physical activity Advanced directives List of other physicians Hospitalizations, surgeries, and ER visits in previous 12 months Vitals Screenings to include cognitive, depression, and falls Referrals and appointments  In addition, I have reviewed and discussed with patient certain preventive protocols, quality metrics, and best practice recommendations. A written personalized care plan for preventive services as well as general preventive health recommendations were provided to patient.   Seabron Cypress [place], [place]   02/07/1094   After Visit Summary: (MyChart) Due to this  being a telephonic visit, the after visit summary with patients personalized plan was offered to patient via MyChart   Notes: Nothing significant to report at this time.

## 2023-07-08 NOTE — Patient Instructions (Signed)
 Mr. Brad Johnson , Thank you for taking time out of your busy schedule to complete your Annual Wellness Visit with me. I enjoyed our conversation and look forward to speaking with you again next year. I, as well as your care team,  appreciate your ongoing commitment to your health goals. Please review the following plan we discussed and let me know if I can assist you in the future. Your Game plan/ To Do List    Follow up Visits: Next Medicare AWV with our clinical staff: In 1 year    Have you seen your provider in the last 6 months (3 months if uncontrolled diabetes)? Yes Next Office Visit with your provider: 07/16/23 @ 7:40  Clinician Recommendations:  Aim for 30 minutes of exercise or brisk walking, 6-8 glasses of water, and 5 servings of fruits and vegetables each day.       This is a list of the screening recommended for you and due dates:  Health Maintenance  Topic Date Due   COVID-19 Vaccine (1) Never done   Zoster (Shingles) Vaccine (1 of 2) Never done   Colon Cancer Screening  Never done   Eye exam for diabetics  11/13/2022   Complete foot exam   04/29/2023   DTaP/Tdap/Td vaccine (1 - Tdap) 03/17/2024*   Pneumococcal Vaccination (1 of 2 - PCV) 03/17/2024*   Flu Shot  09/03/2023   Hemoglobin A1C  09/15/2023   Yearly kidney function blood test for diabetes  03/17/2024   Yearly kidney health urinalysis for diabetes  03/17/2024   Medicare Annual Wellness Visit  07/07/2024   Hepatitis C Screening  Completed   HIV Screening  Completed   HPV Vaccine  Aged Out   Meningitis B Vaccine  Aged Out  *Topic was postponed. The date shown is not the original due date.    Advanced directives: (ACP Link)Information on Advanced Care Planning can be found at [place]  Secretary of [facility] [employer] [facility] Advance Health Care Directives Advance Health Care Directives. [email or web address]   Advance Care Planning is important because it:  [x]  Makes sure you receive the medical care that is consistent with your values,  goals, and preferences  [x]  It provides guidance to your family and loved ones and reduces their decisional burden about whether or not they are making the right decisions based on your wishes.  Follow the link provided in your after visit summary or read over the paperwork we have mailed to you to help you started getting your Advance Directives in place. If you need assistance in completing these, please reach out to us  so that we can help you!  See attachments for Preventive Care and Fall Prevention Tips.

## 2023-07-15 DIAGNOSIS — G894 Chronic pain syndrome: Secondary | ICD-10-CM | POA: Diagnosis not present

## 2023-07-15 DIAGNOSIS — M5459 Other low back pain: Secondary | ICD-10-CM | POA: Diagnosis not present

## 2023-07-15 DIAGNOSIS — Z79899 Other long term (current) drug therapy: Secondary | ICD-10-CM | POA: Diagnosis not present
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
## 2023-07-16 ENCOUNTER — Ambulatory Visit (INDEPENDENT_AMBULATORY_CARE_PROVIDER_SITE_OTHER): Payer: PPO | Admitting: Family Medicine

## 2023-07-16 ENCOUNTER — Encounter: Payer: Self-pay | Admitting: Family Medicine

## 2023-07-16 VITALS — BP 126/72 | HR 58 | Temp 97.2°F | Ht 73.0 in | Wt 296.0 lb

## 2023-07-16 DIAGNOSIS — E1142 Type 2 diabetes mellitus with diabetic polyneuropathy: Secondary | ICD-10-CM

## 2023-07-16 DIAGNOSIS — F0390 Unspecified dementia without behavioral disturbance: Secondary | ICD-10-CM

## 2023-07-16 DIAGNOSIS — M1712 Unilateral primary osteoarthritis, left knee: Secondary | ICD-10-CM | POA: Diagnosis not present

## 2023-07-16 DIAGNOSIS — F322 Major depressive disorder, single episode, severe without psychotic features: Secondary | ICD-10-CM | POA: Diagnosis not present

## 2023-07-16 DIAGNOSIS — N138 Other obstructive and reflux uropathy: Secondary | ICD-10-CM

## 2023-07-16 DIAGNOSIS — N401 Enlarged prostate with lower urinary tract symptoms: Secondary | ICD-10-CM | POA: Diagnosis not present

## 2023-07-16 DIAGNOSIS — G629 Polyneuropathy, unspecified: Secondary | ICD-10-CM

## 2023-07-16 DIAGNOSIS — M1611 Unilateral primary osteoarthritis, right hip: Secondary | ICD-10-CM | POA: Diagnosis not present

## 2023-07-16 DIAGNOSIS — E782 Mixed hyperlipidemia: Secondary | ICD-10-CM

## 2023-07-16 DIAGNOSIS — I10 Essential (primary) hypertension: Secondary | ICD-10-CM

## 2023-07-16 DIAGNOSIS — K5909 Other constipation: Secondary | ICD-10-CM | POA: Diagnosis not present

## 2023-07-16 MED ORDER — VENLAFAXINE HCL ER 75 MG PO CP24: 75.0000 mg | ORAL_CAPSULE | Freq: Every day | ORAL | 0 refills | Status: DC

## 2023-07-16 MED ORDER — OZEMPIC (1 MG/DOSE) 4 MG/3ML ~~LOC~~ SOPN
1.0000 mg | PEN_INJECTOR | SUBCUTANEOUS | 1 refills | Status: DC
Start: 2023-07-16 — End: 2023-12-20

## 2023-07-16 MED ORDER — FUROSEMIDE 20 MG PO TABS: 20.0000 mg | ORAL_TABLET | Freq: Every day | ORAL | 3 refills | Status: AC

## 2023-07-16 MED ORDER — BETHANECHOL CHLORIDE 25 MG PO TABS: 25.0000 mg | ORAL_TABLET | Freq: Every day | ORAL | 3 refills | Status: AC

## 2023-07-16 MED ORDER — TADALAFIL 5 MG PO TABS: 5.0000 mg | ORAL_TABLET | Freq: Every day | ORAL | 1 refills | Status: AC

## 2023-07-16 MED ORDER — JARDIANCE 10 MG PO TABS: 10.0000 mg | ORAL_TABLET | Freq: Every day | ORAL | 2 refills | Status: AC

## 2023-07-16 MED ORDER — CELECOXIB 200 MG PO CAPS: ORAL_CAPSULE | ORAL | 3 refills | Status: AC

## 2023-07-16 MED ORDER — OMEPRAZOLE 20 MG PO CPDR: 20.0000 mg | DELAYED_RELEASE_CAPSULE | Freq: Every day | ORAL | 2 refills | Status: AC

## 2023-07-16 MED ORDER — FISH OIL 1000 MG PO CAPS
1.0000 | ORAL_CAPSULE | Freq: Every day | ORAL | 3 refills | Status: AC
Start: 2023-07-16 — End: ?

## 2023-07-16 MED ORDER — ROSUVASTATIN CALCIUM 20 MG PO TABS: 20.0000 mg | ORAL_TABLET | Freq: Every day | ORAL | 1 refills | Status: DC

## 2023-07-16 MED ORDER — LINACLOTIDE 145 MCG PO CAPS: 145.0000 ug | ORAL_CAPSULE | Freq: Every day | ORAL | 3 refills | Status: AC

## 2023-07-16 MED ORDER — LISINOPRIL 10 MG PO TABS
ORAL_TABLET | ORAL | 2 refills | Status: AC
Start: 2023-07-16 — End: ?

## 2023-07-16 MED ORDER — GABAPENTIN 600 MG PO TABS: 600.0000 mg | ORAL_TABLET | Freq: Three times a day (TID) | ORAL | 1 refills | Status: DC

## 2023-07-16 MED ORDER — EZETIMIBE 10 MG PO TABS: 10.0000 mg | ORAL_TABLET | Freq: Every day | ORAL | 3 refills | Status: AC

## 2023-07-17 LAB — TSH: TSH: 2.97 u[IU]/mL (ref 0.450–4.500)

## 2023-07-17 LAB — COMPREHENSIVE METABOLIC PANEL WITH GFR
ALT: 23 IU/L (ref 0–44)
AST: 24 IU/L (ref 0–40)
Albumin: 4.5 g/dL (ref 3.9–4.9)
Alkaline Phosphatase: 68 IU/L (ref 44–121)
BUN/Creatinine Ratio: 14 (ref 10–24)
BUN: 11 mg/dL (ref 8–27)
Bilirubin Total: 0.6 mg/dL (ref 0.0–1.2)
CO2: 23 mmol/L (ref 20–29)
Calcium: 9.4 mg/dL (ref 8.6–10.2)
Chloride: 100 mmol/L (ref 96–106)
Creatinine, Ser: 0.77 mg/dL (ref 0.76–1.27)
Globulin, Total: 2.2 g/dL (ref 1.5–4.5)
Glucose: 98 mg/dL (ref 70–99)
Potassium: 4.9 mmol/L (ref 3.5–5.2)
Sodium: 138 mmol/L (ref 134–144)
Total Protein: 6.7 g/dL (ref 6.0–8.5)
eGFR: 101 mL/min/{1.73_m2} (ref >59)

## 2023-07-17 LAB — LIPID PANEL
Chol/HDL Ratio: 2.1 ratio (ref 0.0–5.0)
Cholesterol, Total: 142 mg/dL (ref 100–199)
HDL: 69 mg/dL (ref >39)
LDL Chol Calc (NIH): 58 mg/dL (ref 0–99)
Triglycerides: 76 mg/dL (ref 0–149)
VLDL Cholesterol Cal: 15 mg/dL (ref 5–40)

## 2023-07-17 LAB — CBC WITH DIFFERENTIAL/PLATELET
Basophils Absolute: 0.1 10*3/uL (ref 0.0–0.2)
Basos: 2 %
EOS (ABSOLUTE): 0.1 10*3/uL (ref 0.0–0.4)
Eos: 3 %
Hematocrit: 44.4 % (ref 37.5–51.0)
Hemoglobin: 14.5 g/dL (ref 13.0–17.7)
Immature Grans (Abs): 0 10*3/uL (ref 0.0–0.1)
Immature Granulocytes: 0 %
Lymphocytes Absolute: 1.5 10*3/uL (ref 0.7–3.1)
Lymphs: 50 %
MCH: 31.6 pg (ref 26.6–33.0)
MCHC: 32.7 g/dL (ref 31.5–35.7)
MCV: 97 fL (ref 79–97)
Monocytes Absolute: 0.3 10*3/uL (ref 0.1–0.9)
Monocytes: 11 %
Neutrophils Absolute: 1 10*3/uL — ABNORMAL LOW (ref 1.4–7.0)
Neutrophils: 34 %
Platelets: 187 10*3/uL (ref 150–450)
RBC: 4.59 x10E6/uL (ref 4.14–5.80)
RDW: 13.5 % (ref 11.6–15.4)
WBC: 2.9 10*3/uL — ABNORMAL LOW (ref 3.4–10.8)

## 2023-07-17 LAB — HEMOGLOBIN A1C
Est. average glucose Bld gHb Est-mCnc: 126 mg/dL
Hgb A1c MFr Bld: 6 % — ABNORMAL HIGH (ref 4.8–5.6)

## 2023-07-18 ENCOUNTER — Ambulatory Visit: Payer: Self-pay | Admitting: Family Medicine

## 2023-07-24 DIAGNOSIS — K5909 Other constipation: Secondary | ICD-10-CM | POA: Insufficient documentation

## 2023-07-24 DIAGNOSIS — F322 Major depressive disorder, single episode, severe without psychotic features: Secondary | ICD-10-CM | POA: Insufficient documentation

## 2023-07-24 NOTE — Assessment & Plan Note (Signed)
 Well controlled.  No changes to medicines. Taking Rosuvastatin  20 mg daily, fish oil  once daily, and zetia  10 mg daily.  Continue to work on eating a healthy diet and exercise.  Labs drawn today.

## 2023-07-24 NOTE — Assessment & Plan Note (Deleted)
 Continue gabapentin.

## 2023-07-24 NOTE — Assessment & Plan Note (Signed)
 Levaquin  as needed for acute onset of severe nocturia. Continue cialis  5 mg once daily

## 2023-07-24 NOTE — Assessment & Plan Note (Signed)
 Control: good Recommend check sugars fasting daily. Recommend check feet daily. Recommend annual eye exams. Medicines: Ozempic  1 mg weekly,Jardiance  10 mg daily. Continue lisinopril  10 mg daily and gabapentin  600 mg three times a day.  Continue to work on eating a healthy diet and exercise.  Labs drawn today.

## 2023-07-24 NOTE — Assessment & Plan Note (Signed)
 Recommend continue to work on eating healthy diet and exercise. Comorbidities: diabetes

## 2023-07-24 NOTE — Assessment & Plan Note (Signed)
 Not at goal.  Start on effexor  xr 75 mg daily.

## 2023-07-24 NOTE — Assessment & Plan Note (Addendum)
 Continue celebrex .  Continue with pain clinic for oxycontin.

## 2023-07-24 NOTE — Assessment & Plan Note (Signed)
Well controlled.  No changes to medicines. Lisinopril 10 mg daily, Aspirin 81 mg daily, Continue to work on eating a healthy diet and exercise.  Labs drawn today.

## 2023-07-24 NOTE — Assessment & Plan Note (Signed)
 Secondary to Traumatic brain injury

## 2023-08-13 ENCOUNTER — Ambulatory Visit: Admitting: Family Medicine

## 2023-09-18 DIAGNOSIS — R059 Cough, unspecified: Secondary | ICD-10-CM | POA: Diagnosis not present

## 2023-09-18 DIAGNOSIS — R0981 Nasal congestion: Secondary | ICD-10-CM | POA: Diagnosis not present

## 2023-09-28 DIAGNOSIS — M1711 Unilateral primary osteoarthritis, right knee: Secondary | ICD-10-CM | POA: Diagnosis not present

## 2023-10-19 ENCOUNTER — Other Ambulatory Visit: Payer: Self-pay | Admitting: Family Medicine

## 2023-10-19 DIAGNOSIS — F322 Major depressive disorder, single episode, severe without psychotic features: Secondary | ICD-10-CM

## 2023-10-26 DIAGNOSIS — R339 Retention of urine, unspecified: Secondary | ICD-10-CM | POA: Diagnosis not present

## 2023-10-26 DIAGNOSIS — N401 Enlarged prostate with lower urinary tract symptoms: Secondary | ICD-10-CM | POA: Diagnosis not present

## 2023-10-27 NOTE — Assessment & Plan Note (Signed)
 Brad Johnson

## 2023-10-27 NOTE — Progress Notes (Signed)
 "  Subjective:  Patient ID: Brad Johnson, male    DOB: Nov 26, 1960  Age: 63 y.o. MRN: [id number]  Chief Complaint  Patient presents with   Medical Management of Chronic Issues    HPI: Discussed the use of AI scribe software for clinical note transcription with the patient, who gave verbal consent to proceed.  History of Present Illness Brad Johnson is a 63 year old male who presents for an annual wellness visit and chronic care management.  Diabetes: Antihyperglycemic therapy and gastrointestinal symptoms - Restarted Ozempic  three months ago after knee replacement - Upon restarting at 1 mg dose, developed severe nausea and constipation - Multiple attempts to restart Ozempic  resulted in recurrent nausea and constipation - A1c is 5.9 - Blood glucose checks every few days, typically 101-103 mg/dL - Ozempic  helped suppress his appetite.   Muscle cramps - Experiences muscle cramps, particularly after climbing stairs or ladders - Cramps occur the day after physical activity  Urinary frequency and diuretic use - Frequent urination, approximately every hour - Currently taking Lasix  20 mg once daily for fluid management  Constipation - Currently taking Linzess  for constipation  Chronic medication use and concerns - Lisinopril  10 mg once daily for blood pressure - Rosuvastatin  20 mg once daily, Zetia  10 mg once daily, and omega-3 supplements for cholesterol management - Celebrex  for bursitis - Omeprazole  for acid reflux - Aspirin 81 mg daily for cardioprotection - Cialis  5 mg daily for prostate issues - Gabapentin  600 mg three times daily for neuropathy - Xanax  as needed  Physical activity - Exercises daily, primarily walking  Preventive health and immunizations - History of colonoscopies, but none in several years - Upcoming eye doctor appointment - Has not received COVID vaccine and declines it       07/16/2023    7:53 AM 07/08/2023    9:11 AM 10/30/2022    7:38 AM  06/30/2022    8:13 AM 06/30/2022    8:11 AM  Depression screen PHQ 2/9  Decreased Interest 0 0 0 0 0  Down, Depressed, Hopeless 3 0 0 0 0  PHQ - 2 Score 3 0 0 0 0  Altered sleeping 0      Tired, decreased energy 3      Change in appetite 3      Feeling bad or failure about yourself  3      Trouble concentrating 3      Moving slowly or fidgety/restless 1      Suicidal thoughts 0      PHQ-9 Score 16      Difficult doing work/chores Not difficult at all            07/08/2023    9:12 AM  Fall Risk   Falls in the past year? 0  Number falls in past yr: 0  Injury with Fall? 0  Risk for fall due to : No Fall Risks  Follow up Falls prevention discussed;Education provided;Falls evaluation completed    Patient Care Team: Sherre Clapper, MD as PCP - General (Family Medicine) Nyle Rankin POUR, [facility] (Inactive) (Pharmacist) Bonner Ade, MD as Consulting Physician (Physical Medicine and Rehabilitation) Ival Domino, FNP as Nurse Practitioner (Urology) Misenheimer, Evalene, MD as Consulting Physician (Gastroenterology)   Review of Systems  Constitutional:  Negative for appetite change, fatigue and fever.  HENT:  Negative for congestion, ear pain, sinus pressure and sore throat.   Eyes: Negative.   Respiratory:  Negative for cough, chest tightness, shortness of breath  and wheezing.   Cardiovascular:  Negative for chest pain and palpitations.  Gastrointestinal:  Positive for nausea. Negative for abdominal pain, constipation, diarrhea and vomiting.  Endocrine: Negative.   Genitourinary:  Negative for dysuria, frequency, hematuria and urgency.  Musculoskeletal:  Negative for arthralgias, back pain, joint swelling and myalgias.  Skin:  Negative for rash.  Allergic/Immunologic: Negative.   Neurological:  Negative for dizziness, weakness, light-headedness and headaches.  Hematological: Negative.   Psychiatric/Behavioral:  Negative for dysphoric mood. The patient is not nervous/anxious.      Current Outpatient Medications on File Prior to Visit  Medication Sig Dispense Refill   Acetaminophen  (TYLENOL  8 HOUR PO) Take by mouth.     ALPRAZolam  (XANAX ) 0.5 MG tablet TAKE 1 TABLET(0.5 MG) BY MOUTH DAILY AS NEEDED 90 tablet 2   aspirin EC 81 MG tablet Take 81 mg by mouth daily.     bethanechol  (URECHOLINE ) 25 MG tablet Take 1 tablet (25 mg total) by mouth daily. 90 tablet 3   celecoxib  (CELEBREX ) 200 MG capsule TAKE 1 CAPSULE(200 MG) BY MOUTH twice daily 180 capsule 3   docusate sodium (COLACE) 100 MG capsule Take 100 mg by mouth 2 (two) times daily.     ezetimibe  (ZETIA ) 10 MG tablet Take 1 tablet (10 mg total) by mouth daily. 90 tablet 3   furosemide  (LASIX ) 20 MG tablet Take 1 tablet (20 mg total) by mouth daily. 90 tablet 3   gabapentin  (NEURONTIN ) 600 MG tablet Take 1 tablet (600 mg total) by mouth 3 (three) times daily. 270 tablet 1   glucose blood (ONETOUCH ULTRA) test strip Use as instructed 100 each 12   JARDIANCE  10 MG TABS tablet Take 1 tablet (10 mg total) by mouth daily. 90 tablet 2   linaclotide  (LINZESS ) 145 MCG CAPS capsule Take 1 capsule (145 mcg total) by mouth daily before breakfast. 90 capsule 3   lisinopril  (ZESTRIL ) 10 MG tablet TAKE 1 TABLET(10 MG) BY MOUTH DAILY 90 tablet 2   Omega-3 Fatty Acids (FISH OIL ) 1000 MG CAPS Take 1 capsule (1,000 mg total) by mouth daily. 90 capsule 3   omeprazole  (PRILOSEC) 20 MG capsule Take 1 capsule (20 mg total) by mouth daily. 90 capsule 2   rosuvastatin  (CRESTOR ) 20 MG tablet Take 1 tablet (20 mg total) by mouth daily. 90 tablet 1   Semaglutide , 1 MG/DOSE, (OZEMPIC , 1 MG/DOSE,) 4 MG/3ML SOPN Inject 1 mg into the skin once a week. 3 mL 1   tadalafil  (CIALIS ) 5 MG tablet Take 1 tablet (5 mg total) by mouth daily. 90 tablet 1   venlafaxine  XR (EFFEXOR -XR) 75 MG 24 hr capsule TAKE 1 CAPSULE(75 MG) BY MOUTH DAILY WITH BREAKFAST 90 capsule 0   No current facility-administered medications on file prior to visit.   Past Medical  History:  Diagnosis Date   Adjustment disorder with depressed mood 03/19/2019   Arthritis    BMI 39.0-39.9,adult 08/27/2021   CHI (closed head injury), sequela 03/19/2019   Class 3 severe obesity due to excess calories with serious comorbidity and body mass index (BMI) of 40.0 to 44.9 in adult 11/01/2022   Depression    Diabetic polyneuropathy (HCC) 03/19/2019   Disc displacement, lumbar 06/08/2018   Dysthymic disorder 06/08/2018   Encounter for long-term use of opiate analgesic 03/19/2019   Enlarged prostate with urinary obstruction 06/08/2018   Esophageal reflux 06/08/2018   Essential hypertension, benign 06/08/2018   History of total left hip arthroplasty 06/08/2018   Hyperlipidemia    Illiteracy  03/19/2019   Irritable bowel syndrome 06/08/2018   Mixed hyperlipidemia 06/08/2018   Osteoarthritis 06/08/2018   Osteoarthritis of left knee 11/01/2022   Osteoarthritis of one hip, right 11/01/2022   Other fatigue 10/30/2022   Polyneuropathy 06/08/2018   Preoperative cardiovascular examination 10/30/2022   Presenile dementia (HCC) 06/08/2018   Prostate enlargement    Sinus bradycardia 10/30/2022   Trochanteric bursitis of right hip 06/08/2018   Past Surgical History:  Procedure Laterality Date   CYSTOSCOPY WITH INSERTION OF UROLIFT     SPINE SURGERY     TOTAL HIP ARTHROPLASTY     TOTAL KNEE ARTHROPLASTY Left 12/2022    Family History  Problem Relation Age of Onset   Diabetes Mother    Lung cancer Mother    Diabetes Father    Cancer Father    Alzheimer's disease Father    Heart disease Father    Stroke Father    Social History   Socioeconomic History   Marital status: Widowed    Spouse name: Not on file   Number of children: Not on file   Years of education: Not on file   Highest education level: 9th grade  Occupational History   Not on file  Tobacco Use   Smoking status: Never   Smokeless tobacco: Never  Vaping Use   Vaping status: Never Used  Substance and  Sexual Activity   Alcohol use: Never   Drug use: Never   Sexual activity: Not on file  Other Topics Concern   Not on file  Social History Narrative   Not on file   Social Drivers of Health   Financial Resource Strain: Low Risk  (07/08/2023)   Overall Financial Resource Strain (CARDIA)    Difficulty of Paying Living Expenses: Not hard at all  Food Insecurity: No Food Insecurity (07/08/2023)   Hunger Vital Sign    Worried About Running Out of Food in the Last Year: Never true    Ran Out of Food in the Last Year: Never true  Transportation Needs: No Transportation Needs (07/08/2023)   PRAPARE - Administrator, Civil Service (Medical): No    Lack of Transportation (Non-Medical): No  Physical Activity: Insufficiently Active (07/08/2023)   Exercise Vital Sign    Days of Exercise per Week: 3 days    Minutes of Exercise per Session: 30 min  Stress: No Stress Concern Present (07/08/2023)   [employer] of Occupational Health - Occupational Stress Questionnaire    Feeling of Stress : Not at all  Social Connections: Moderately Isolated (07/08/2023)   Social Connection and Isolation Panel    Frequency of Communication with Friends and Family: More than three times a week    Frequency of Social Gatherings with Friends and Family: More than three times a week    Attends Religious Services: Never    Database Administrator or Organizations: No    Attends Engineer, Structural: Never    Marital Status: Married    Objective:  BP 110/62   Pulse (!) 52   Temp 97.8 F (36.6 C) (Temporal)   Resp 18   Ht 6' 1 (1.854 m)   Wt 291 lb 12.8 oz (132.4 kg)   SpO2 95%   BMI 38.50 kg/m      10/28/2023    7:34 AM 07/16/2023    7:36 AM 07/08/2023    8:49 AM  BP/Weight  Systolic BP 110 126 --  Diastolic BP 62 72 --  Wt. (Lbs)  291.8 296 299  BMI 38.5 kg/m2 39.05 kg/m2 40.55 kg/m2    Physical Exam Vitals reviewed.  Constitutional:      Appearance: Normal appearance. He is obese.   Neck:     Vascular: No carotid bruit.  Cardiovascular:     Rate and Rhythm: Normal rate and regular rhythm.     Pulses: Normal pulses.     Heart sounds: Normal heart sounds.  Pulmonary:     Effort: Pulmonary effort is normal.     Breath sounds: Normal breath sounds. No wheezing, rhonchi or rales.  Abdominal:     General: Bowel sounds are normal.     Palpations: Abdomen is soft.     Tenderness: There is no abdominal tenderness.  Neurological:     Mental Status: He is alert and oriented to person, place, and time.  Psychiatric:        Mood and Affect: Mood normal.        Behavior: Behavior normal.      Diabetic foot exam was performed with the following findings:   No deformities, ulcerations, or other skin breakdown Normal sensation of 10g monofilament Intact posterior tibialis and dorsalis pedis pulses      Lab Results  Component Value Date   WBC 2.9 (L) 10/28/2023   HGB 15.7 10/28/2023   HCT 47.7 10/28/2023   PLT 193 10/28/2023   GLUCOSE 96 10/28/2023   CHOL 142 07/16/2023   TRIG 76 07/16/2023   HDL 69 07/16/2023   LDLCALC 58 07/16/2023   ALT 25 10/28/2023   AST 23 10/28/2023   NA 137 10/28/2023   K 4.7 10/28/2023   CL 98 10/28/2023   CREATININE 0.79 10/28/2023   BUN 13 10/28/2023   CO2 24 10/28/2023   TSH 2.970 07/16/2023   INR 1.0 10/30/2022   HGBA1C 5.9 10/29/2023    Results for orders placed or performed in visit on 10/28/23  CBC with Differential/Platelet   Collection Time: 10/28/23  8:26 AM  Result Value Ref Range   WBC 2.9 (L) 3.4 - 10.8 x10E3/uL   RBC 4.85 4.14 - 5.80 x10E6/uL   Hemoglobin 15.7 13.0 - 17.7 g/dL   Hematocrit 52.2 62.4 - 51.0 %   MCV 98 (H) 79 - 97 fL   MCH 32.4 26.6 - 33.0 pg   MCHC 32.9 31.5 - 35.7 g/dL   RDW 87.1 88.3 - 84.5 %   Platelets 193 150 - 450 x10E3/uL   Neutrophils 41 Not Estab. %   Lymphs 45 Not Estab. %   Monocytes 11 Not Estab. %   Eos 1 Not Estab. %   Basos 2 Not Estab. %   Neutrophils Absolute 1.2 (L)  1.4 - 7.0 x10E3/uL   Lymphocytes Absolute 1.3 0.7 - 3.1 x10E3/uL   Monocytes Absolute 0.3 0.1 - 0.9 x10E3/uL   EOS (ABSOLUTE) 0.0 0.0 - 0.4 x10E3/uL   Basophils Absolute 0.1 0.0 - 0.2 x10E3/uL   Immature Granulocytes 0 Not Estab. %   Immature Grans (Abs) 0.0 0.0 - 0.1 x10E3/uL  PSA   Collection Time: 10/28/23  8:26 AM  Result Value Ref Range   Prostate Specific Ag, Serum 0.2 0.0 - 4.0 ng/mL  Comprehensive metabolic panel with GFR   Collection Time: 10/28/23  8:26 AM  Result Value Ref Range   Glucose 96 70 - 99 mg/dL   BUN 13 8 - 27 mg/dL   Creatinine, Ser 9.20 0.76 - 1.27 mg/dL   eGFR 99 >40 fO/fpw/8.26   BUN/Creatinine Ratio 16  10 - 24   Sodium 137 134 - 144 mmol/L   Potassium 4.7 3.5 - 5.2 mmol/L   Chloride 98 96 - 106 mmol/L   CO2 24 20 - 29 mmol/L   Calcium  9.6 8.6 - 10.2 mg/dL   Total Protein 7.2 6.0 - 8.5 g/dL   Albumin 4.8 3.9 - 4.9 g/dL   Globulin, Total 2.4 1.5 - 4.5 g/dL   Bilirubin Total 0.6 0.0 - 1.2 mg/dL   Alkaline Phosphatase 63 47 - 123 IU/L   AST 23 0 - 40 IU/L   ALT 25 0 - 44 IU/L  Phosphorus   Collection Time: 10/28/23  8:26 AM  Result Value Ref Range   Phosphorus 3.2 2.8 - 4.1 mg/dL  Magnesium   Collection Time: 10/28/23  8:26 AM  Result Value Ref Range   Magnesium 2.1 1.6 - 2.3 mg/dL  POCT glycosylated hemoglobin (Hb A1C)   Collection Time: 10/29/23  7:25 AM  Result Value Ref Range   Hemoglobin A1C     HbA1c POC (<> result, manual entry) 5.9 4.0 - 5.6 %   HbA1c, POC (prediabetic range)     HbA1c, POC (controlled diabetic range)    .  Assessment & Plan:   Assessment & Plan Essential hypertension, benign Well controlled.  No changes to medicines. Lisinopril  10 mg daily, Aspirin 81 mg daily, Continue to work on eating a healthy diet and exercise.      Diabetic polyneuropathy associated with type 2 diabetes mellitus (HCC) Type 2 diabetes well-controlled with an A1c of 5.9. Experiencing nausea with Ozempic  1 mg. Gabapentin  600 mg TID for  neuropathy is effective. - Provide samples of Ozempic  0.25 mg for 4 weeks. - Increase Ozempic  to 0.5 mg for 2 weeks after initial 4 weeks. - Consider increasing Ozempic  to 1 mg if tolerated in the future.  - Continue gabapentin  600 mg TID Orders:   POCT glycosylated hemoglobin (Hb A1C)  Mixed hyperlipidemia Well controlled.  No changes to medicines. Taking Rosuvastatin  20 mg daily, fish oil  once daily, and zetia  10 mg daily.  Continue to work on eating a healthy diet and exercise.       Other decreased white blood cell (WBC) count Return for labs next week.  Orders:   CBC with Differential/Platelet  Myalgia Return for labs next week.  Change lasix  20 mg daily as needed.  Orders:   Comprehensive metabolic panel with GFR   Phosphorus   Magnesium  BPH associated with nocturia  Check psa and send to [facility].   Orders:   PSA  Screen for colon cancer GI referral Orders:   Ambulatory referral to Gastroenterology  Immunization due  Orders:   Pneumococcal conjugate vaccine 20-valent  Encounter for immunization  Orders:   Flu vaccine HIGH DOSE PF(Fluzone Trivalent)    Body mass index is 38.5 kg/m.    No orders of the defined types were placed in this encounter.   Orders Placed This Encounter  Procedures   Pneumococcal conjugate vaccine 20-valent   Flu vaccine HIGH DOSE PF(Fluzone Trivalent)   CBC with Differential/Platelet   PSA   Comprehensive metabolic panel with GFR   Phosphorus   Magnesium   Ambulatory referral to Gastroenterology   POCT glycosylated hemoglobin (Hb A1C)    Follow-up: Return in about 4 months (around 02/27/2024) for chronic follow up.  An After Visit Summary was printed and given to the patient.  Abigail Free, MD [facility] [phone] "

## 2023-10-27 NOTE — Assessment & Plan Note (Addendum)
 Well controlled.  No changes to medicines. Taking Rosuvastatin  20 mg daily, fish oil  once daily, and zetia  10 mg daily.  Continue to work on eating a healthy diet and exercise.

## 2023-10-28 ENCOUNTER — Encounter: Payer: Self-pay | Admitting: Family Medicine

## 2023-10-28 ENCOUNTER — Ambulatory Visit: Admitting: Family Medicine

## 2023-10-28 VITALS — BP 110/62 | HR 52 | Temp 97.8°F | Resp 18 | Ht 73.0 in | Wt 291.8 lb

## 2023-10-28 DIAGNOSIS — Z1211 Encounter for screening for malignant neoplasm of colon: Secondary | ICD-10-CM

## 2023-10-28 DIAGNOSIS — E782 Mixed hyperlipidemia: Secondary | ICD-10-CM

## 2023-10-28 DIAGNOSIS — R351 Nocturia: Secondary | ICD-10-CM | POA: Diagnosis not present

## 2023-10-28 DIAGNOSIS — M791 Myalgia, unspecified site: Secondary | ICD-10-CM | POA: Diagnosis not present

## 2023-10-28 DIAGNOSIS — D72818 Other decreased white blood cell count: Secondary | ICD-10-CM

## 2023-10-28 DIAGNOSIS — E1142 Type 2 diabetes mellitus with diabetic polyneuropathy: Secondary | ICD-10-CM

## 2023-10-28 DIAGNOSIS — I10 Essential (primary) hypertension: Secondary | ICD-10-CM | POA: Diagnosis not present

## 2023-10-28 DIAGNOSIS — Z23 Encounter for immunization: Secondary | ICD-10-CM | POA: Diagnosis not present

## 2023-10-28 DIAGNOSIS — N401 Enlarged prostate with lower urinary tract symptoms: Secondary | ICD-10-CM

## 2023-10-28 DIAGNOSIS — D72819 Decreased white blood cell count, unspecified: Secondary | ICD-10-CM | POA: Insufficient documentation

## 2023-10-28 NOTE — Assessment & Plan Note (Addendum)
 Return for labs next week.  Change lasix  20 mg daily as needed.  Orders:   Comprehensive metabolic panel with GFR   Phosphorus   Magnesium

## 2023-10-28 NOTE — Patient Instructions (Addendum)
  VISIT SUMMARY: You had your annual wellness visit today, where we discussed your diabetes management, muscle cramps, urinary frequency, and other chronic conditions. We also reviewed your medications and preventive health measures.  YOUR PLAN: TYPE 2 DIABETES MELLITUS WITH DIABETIC POLYNEUROPATHY: Your diabetes is well-controlled with an A1c of 5.9, but you have been experiencing nausea and constipation with the higher dose of Ozempic . -Start with Ozempic  0.25 mg for 4 weeks. -Increase to Ozempic  0.5 mg for 2 weeks after the initial 4 weeks. -Consider increasing to Ozempic  1 mg if tolerated. -Continue taking gabapentin  600 mg three times daily for neuropathy.  ESSENTIAL HYPERTENSION: Your blood pressure is being managed with lisinopril  10 mg daily. -Continue taking lisinopril  10 mg daily.  MIXED HYPERLIPIDEMIA: Your cholesterol is being managed with rosuvastatin  and Zetia , but you have questions about the necessity of both medications. -Continue taking rosuvastatin  20 mg and Zetia  10 mg daily.   BILATERAL LOWER EXTREMITY CRAMPS: You are experiencing muscle cramps, possibly related to an electrolyte imbalance or Lasix  use. -We will check your potassium and magnesium levels. -Consider potassium supplements if your levels are low. -Reduce Lasix  use to as needed.  LOWER EXTREMITY EDEMA: You are taking Lasix  for fluid management, but you have no current swelling. -Take Lasix  20 mg as needed.  CHRONIC CONSTIPATION: You have chronic constipation managed with Linzess  and prefer to stop prescription refills as only takes as needed and has plenty.   GASTROESOPHAGEAL REFLUX DISEASE: Your acid reflux is managed with omeprazole . -Continue taking omeprazole  as prescribed.  CHRONIC PAIN DUE TO BURSITIS: Your chronic bursitis pain is managed with Celebrex . -Try reducing Celebrex  to once daily if possible.  BENIGN PROSTATIC HYPERPLASIA: Your prostate issues are managed with Cialis  5 mg  daily. -Continue taking Cialis  5 mg daily.  PREVENTIVE HEALTH AND IMMUNIZATIONS: We discussed various preventive health measures and immunizations. -Administer flu shot and pneumonia shot. -Recommend shingles vaccine. -Referral for colonoscopy for colon cancer screening. -Request eye exam records from [facility]. -Decline COVID-19 vaccine per your preference.                      Contains text generated by [employer]bridge.                                 Contains text generated by [employer]bridge.

## 2023-10-28 NOTE — Assessment & Plan Note (Addendum)
 Check psa and send to [facility].   Orders:   PSA

## 2023-10-28 NOTE — Assessment & Plan Note (Addendum)
 Return for labs next week.  Orders:   CBC with Differential/Platelet

## 2023-10-29 ENCOUNTER — Other Ambulatory Visit: Payer: Self-pay | Admitting: Family Medicine

## 2023-10-29 LAB — COMPREHENSIVE METABOLIC PANEL WITH GFR
ALT: 25 IU/L (ref 0–44)
AST: 23 IU/L (ref 0–40)
Albumin: 4.8 g/dL (ref 3.9–4.9)
Alkaline Phosphatase: 63 IU/L (ref 47–123)
BUN/Creatinine Ratio: 16 (ref 10–24)
BUN: 13 mg/dL (ref 8–27)
Bilirubin Total: 0.6 mg/dL (ref 0.0–1.2)
CO2: 24 mmol/L (ref 20–29)
Calcium: 9.6 mg/dL (ref 8.6–10.2)
Chloride: 98 mmol/L (ref 96–106)
Creatinine, Ser: 0.79 mg/dL (ref 0.76–1.27)
Globulin, Total: 2.4 g/dL (ref 1.5–4.5)
Glucose: 96 mg/dL (ref 70–99)
Potassium: 4.7 mmol/L (ref 3.5–5.2)
Sodium: 137 mmol/L (ref 134–144)
Total Protein: 7.2 g/dL (ref 6.0–8.5)
eGFR: 99 mL/min/1.73 (ref >59)

## 2023-10-29 LAB — CBC WITH DIFFERENTIAL/PLATELET
Basophils Absolute: 0.1 x10E3/uL (ref 0.0–0.2)
Basos: 2 %
EOS (ABSOLUTE): 0 x10E3/uL (ref 0.0–0.4)
Eos: 1 %
Hematocrit: 47.7 % (ref 37.5–51.0)
Hemoglobin: 15.7 g/dL (ref 13.0–17.7)
Immature Grans (Abs): 0 x10E3/uL (ref 0.0–0.1)
Immature Granulocytes: 0 %
Lymphocytes Absolute: 1.3 x10E3/uL (ref 0.7–3.1)
Lymphs: 45 %
MCH: 32.4 pg (ref 26.6–33.0)
MCHC: 32.9 g/dL (ref 31.5–35.7)
MCV: 98 fL — ABNORMAL HIGH (ref 79–97)
Monocytes Absolute: 0.3 x10E3/uL (ref 0.1–0.9)
Monocytes: 11 %
Neutrophils Absolute: 1.2 x10E3/uL — ABNORMAL LOW (ref 1.4–7.0)
Neutrophils: 41 %
Platelets: 193 x10E3/uL (ref 150–450)
RBC: 4.85 x10E6/uL (ref 4.14–5.80)
RDW: 12.8 % (ref 11.6–15.4)
WBC: 2.9 x10E3/uL — ABNORMAL LOW (ref 3.4–10.8)

## 2023-10-29 LAB — PSA: Prostate Specific Ag, Serum: 0.2 ng/mL (ref 0.0–4.0)

## 2023-10-29 LAB — POCT GLYCOSYLATED HEMOGLOBIN (HGB A1C): HbA1c POC (<> result, manual entry): 5.9 % (ref 4.0–5.6)

## 2023-10-29 LAB — PHOSPHORUS: Phosphorus: 3.2 mg/dL (ref 2.8–4.1)

## 2023-10-29 LAB — MAGNESIUM: Magnesium: 2.1 mg/dL (ref 1.6–2.3)

## 2023-10-31 ENCOUNTER — Ambulatory Visit: Payer: Self-pay | Admitting: Family Medicine

## 2023-11-15 DIAGNOSIS — G894 Chronic pain syndrome: Secondary | ICD-10-CM | POA: Diagnosis not present

## 2023-11-15 DIAGNOSIS — M5416 Radiculopathy, lumbar region: Secondary | ICD-10-CM | POA: Diagnosis not present

## 2023-11-15 DIAGNOSIS — Z5181 Encounter for therapeutic drug level monitoring: Secondary | ICD-10-CM | POA: Diagnosis not present

## 2023-12-20 ENCOUNTER — Other Ambulatory Visit: Payer: Self-pay | Admitting: Family Medicine

## 2023-12-20 DIAGNOSIS — E1142 Type 2 diabetes mellitus with diabetic polyneuropathy: Secondary | ICD-10-CM

## 2023-12-29 ENCOUNTER — Other Ambulatory Visit: Payer: Self-pay | Admitting: Physician Assistant

## 2023-12-29 DIAGNOSIS — E1142 Type 2 diabetes mellitus with diabetic polyneuropathy: Secondary | ICD-10-CM

## 2024-01-14 DIAGNOSIS — M1711 Unilateral primary osteoarthritis, right knee: Secondary | ICD-10-CM | POA: Diagnosis not present

## 2024-01-15 ENCOUNTER — Other Ambulatory Visit: Payer: Self-pay | Admitting: Family Medicine

## 2024-01-15 DIAGNOSIS — F322 Major depressive disorder, single episode, severe without psychotic features: Secondary | ICD-10-CM

## 2024-01-17 ENCOUNTER — Other Ambulatory Visit: Payer: Self-pay | Admitting: Family Medicine

## 2024-01-17 DIAGNOSIS — E782 Mixed hyperlipidemia: Secondary | ICD-10-CM

## 2024-02-11 ENCOUNTER — Encounter: Payer: Self-pay | Admitting: Family Medicine

## 2024-02-11 ENCOUNTER — Ambulatory Visit: Admitting: Family Medicine

## 2024-02-11 VITALS — BP 116/72 | HR 60 | Temp 97.8°F | Ht 73.0 in | Wt 298.2 lb

## 2024-02-11 DIAGNOSIS — I2510 Atherosclerotic heart disease of native coronary artery without angina pectoris: Secondary | ICD-10-CM

## 2024-02-11 DIAGNOSIS — I10 Essential (primary) hypertension: Secondary | ICD-10-CM | POA: Diagnosis not present

## 2024-02-11 DIAGNOSIS — Z7985 Long-term (current) use of injectable non-insulin antidiabetic drugs: Secondary | ICD-10-CM

## 2024-02-11 DIAGNOSIS — E1142 Type 2 diabetes mellitus with diabetic polyneuropathy: Secondary | ICD-10-CM | POA: Diagnosis not present

## 2024-02-11 DIAGNOSIS — E782 Mixed hyperlipidemia: Secondary | ICD-10-CM

## 2024-02-11 LAB — POCT LIPID PANEL
HDL: 71
LDL: 87
Non-HDL: 98
TC: 170
TRG: 59

## 2024-02-11 LAB — POCT GLYCOSYLATED HEMOGLOBIN (HGB A1C): HbA1c POC (<> result, manual entry): 6.2 %

## 2024-02-11 NOTE — Assessment & Plan Note (Signed)
 Orders:    POCT glycosylated hemoglobin (Hb A1C)

## 2024-02-11 NOTE — Progress Notes (Unsigned)
 "  Subjective:  Patient ID: Brad Johnson, male    DOB: [birth year]  Age: 64 y.o. MRN: [id number]  Chief Complaint  Patient presents with   Medical Management of Chronic Issues    HPI: Discussed the use of AI scribe software for clinical note transcription with the patient, who gave verbal consent to proceed.  History of Present Illness [name] is a 63 year old male with hyperlipidemia and knee pain who presents for medication review and follow-up.  Knee pain and bursitis - Knee pain primarily attributed to bursitis - Takes Celebrex  200 mg twice daily with significant relief - Able to sit comfortably with current regimen - No chest pain or breathing problems  Diabetic neuropathy - Neuropathic symptoms affecting the legs - Takes gabapentin  for symptom management  Diabetes mellitus type 2 - Takes Jardiance  10 mg daily and Ozempic  1 mg weekly - A1c well-controlled at 6.2 - Currently out of test strips and needles for glucose monitoring  Hyperlipidemia - Takes rosuvastatin  20 mg daily and fish oil  - Recalls a CT scan showing cholesterol buildup and a prior stress test - No current symptoms of heart disease  Benign prostatic hyperplasia and urinary symptoms - Takes tadalafil  daily with effective symptom control - Uses bethanechol  for flare-ups related to a past procedure       02/11/2024    7:37 AM 07/16/2023    7:53 AM 07/08/2023    9:11 AM 10/30/2022    7:38 AM 06/30/2022    8:13 AM  Depression screen PHQ 2/9  Decreased Interest 0 0 0 0 0  Down, Depressed, Hopeless 0 3 0 0 0  PHQ - 2 Score 0 3 0 0 0  Altered sleeping 0 0     Tired, decreased energy 0 3     Change in appetite 0 3     Feeling bad or failure about yourself  0 3     Trouble concentrating 0 3     Moving slowly or fidgety/restless 0 1     Suicidal thoughts 0 0     PHQ-9 Score 0 16      Difficult doing work/chores Not difficult at all Not difficult at all        Data saved with a previous flowsheet  row definition        02/11/2024    7:37 AM  Fall Risk   Falls in the past year? 0  Number falls in past yr: 0  Injury with Fall? 0  Risk for fall due to : No Fall Risks  Follow up Falls evaluation completed    Patient Care Team: Sherre Clapper, MD as PCP - General (Family Medicine) Nyle Rankin [facility], [facility] (Inactive) (Pharmacist) Bonner Ade, MD as Consulting Physician (Physical Medicine and Rehabilitation) Ival Domino, FNP as Nurse Practitioner (Urology) Misenheimer, Evalene, MD as Consulting Physician (Gastroenterology)   Review of Systems  All other systems reviewed and are negative.   Medications Ordered Prior to Encounter[1] Past Medical History:  Diagnosis Date   Adjustment disorder with depressed mood 03/19/2019   Arthritis    BMI 39.0-39.9,adult 08/27/2021   [facility] (closed head injury), sequela 03/19/2019   Class 3 severe obesity due to excess calories with serious comorbidity and body mass index (BMI) of 40.0 to 44.9 in adult [facility]) 11/01/2022   Depression    Diabetic polyneuropathy (HCC) 03/19/2019   Disc displacement, lumbar 06/08/2018   Dysthymic disorder 06/08/2018   Encounter for long-term use of opiate analgesic 03/19/2019  Enlarged prostate with urinary obstruction 06/08/2018   Esophageal reflux 06/08/2018   Essential hypertension, benign 06/08/2018   History of total left hip arthroplasty 06/08/2018   Hyperlipidemia    Illiteracy 03/19/2019   Irritable bowel syndrome 06/08/2018   Mixed hyperlipidemia 06/08/2018   Osteoarthritis 06/08/2018   Osteoarthritis of left knee 11/01/2022   Osteoarthritis of one hip, right 11/01/2022   Other fatigue 10/30/2022   Polyneuropathy 06/08/2018   Preoperative cardiovascular examination 10/30/2022   Presenile dementia (HCC) 06/08/2018   Prostate enlargement    Sinus bradycardia 10/30/2022   Trochanteric bursitis of right hip 06/08/2018   Past Surgical History:  Procedure Laterality Date   CYSTOSCOPY WITH  INSERTION OF UROLIFT     SPINE SURGERY     TOTAL HIP ARTHROPLASTY     TOTAL KNEE ARTHROPLASTY Left 12/2022    Family History  Problem Relation Age of Onset   Diabetes Mother    Lung cancer Mother    Diabetes Father    Cancer Father    Alzheimer's disease Father    Heart disease Father    Stroke Father    Social History   Socioeconomic History   Marital status: Widowed    Spouse name: Not on file   Number of children: Not on file   Years of education: Not on file   Highest education level: 9th grade  Occupational History   Not on file  Tobacco Use   Smoking status: Never   Smokeless tobacco: Never  Vaping Use   Vaping status: Never Used  Substance and Sexual Activity   Alcohol use: Never   Drug use: Never   Sexual activity: Not on file  Other Topics Concern   Not on file  Social History Narrative   Not on file   Social Drivers of Health   Tobacco Use: Low Risk (02/11/2024)   Patient History    Smoking Tobacco Use: Never    Smokeless Tobacco Use: Never    Passive Exposure: Not on file  Financial Resource Strain: Low Risk (07/08/2023)   Overall Financial Resource Strain (CARDIA)    Difficulty of Paying Living Expenses: Not hard at all  Food Insecurity: No Food Insecurity (07/08/2023)   Hunger Vital Sign    Worried About Running Out of Food in the Last Year: Never true    Ran Out of Food in the Last Year: Never true  Transportation Needs: No Transportation Needs (07/08/2023)   PRAPARE - Administrator, Civil Service (Medical): No    Lack of Transportation (Non-Medical): No  Physical Activity: Insufficiently Active (07/08/2023)   Exercise Vital Sign    Days of Exercise per Week: 3 days    Minutes of Exercise per Session: 30 min  Stress: No Stress Concern Present (07/08/2023)   [employer] of Occupational Health - Occupational Stress Questionnaire    Feeling of Stress : Not at all  Social Connections: Moderately Isolated (07/08/2023)   Social Connection  and Isolation Panel    Frequency of Communication with Friends and Family: More than three times a week    Frequency of Social Gatherings with Friends and Family: More than three times a week    Attends Religious Services: Never    Database Administrator or Organizations: No    Attends Banker Meetings: Never    Marital Status: Married  Depression (PHQ2-9): Low Risk (02/11/2024)   Depression (PHQ2-9)    PHQ-2 Score: 0  Alcohol Screen: Low Risk (07/08/2023)  Alcohol Screen    Last Alcohol Screening Score (AUDIT): 0  Housing: Unknown (07/16/2023)   Epic    Unable to Pay for Housing in the Last Year: Not on file    Number of Times Moved in the Last Year: 0    Homeless in the Last Year: Not on file  Utilities: Not At Risk (07/08/2023)   AHC Utilities    Threatened with loss of utilities: No  Health Literacy: Adequate Health Literacy (07/08/2023)   B1300 Health Literacy    Frequency of need for help with medical instructions: Never    Objective:  BP 116/72   Pulse 60   Temp 97.8 F (36.6 C)   Ht 6' 1 (1.854 m)   Wt 298 lb 3.2 oz (135.3 kg)   SpO2 95%   BMI 39.34 kg/m      02/11/2024    7:30 AM 10/28/2023    7:34 AM 07/16/2023    7:36 AM  BP/Weight  Systolic BP 116 110 126  Diastolic BP 72 62 72  Wt. (Lbs) 298.2 291.8 296  BMI 39.34 kg/m2 38.5 kg/m2 39.05 kg/m2    Physical Exam Vitals reviewed.  Constitutional:      Appearance: Normal appearance. He is obese.  Neck:     Vascular: No carotid bruit.  Cardiovascular:     Rate and Rhythm: Normal rate and regular rhythm.     Pulses: Normal pulses.     Heart sounds: Normal heart sounds.  Pulmonary:     Effort: Pulmonary effort is normal.     Breath sounds: Normal breath sounds. No wheezing, rhonchi or rales.  Abdominal:     General: Bowel sounds are normal.     Palpations: Abdomen is soft.     Tenderness: There is no abdominal tenderness.  Neurological:     Mental Status: He is alert.  Psychiatric:         Mood and Affect: Mood normal.        Behavior: Behavior normal.         Lab Results  Component Value Date   WBC 3.6 02/11/2024   HGB 15.5 02/11/2024   HCT 45.5 02/11/2024   PLT 188 02/11/2024   GLUCOSE 91 02/11/2024   CHOL 142 07/16/2023   TRIG 76 07/16/2023   HDL 69 07/16/2023   LDLCALC 58 07/16/2023   ALT 24 02/11/2024   AST 23 02/11/2024   NA 138 02/11/2024   K 4.5 02/11/2024   CL 98 02/11/2024   CREATININE 0.74 (L) 02/11/2024   BUN 15 02/11/2024   CO2 26 02/11/2024   TSH 2.970 07/16/2023   INR 1.0 10/30/2022   HGBA1C 6.2 02/11/2024    Results for orders placed or performed in visit on 02/11/24  POCT glycosylated hemoglobin (Hb A1C)   Collection Time: 02/11/24  7:47 AM  Result Value Ref Range   Hemoglobin A1C     HbA1c POC (<> result, manual entry) 6.2 4.0 - 5.6 %   HbA1c, POC (prediabetic range)     HbA1c, POC (controlled diabetic range)    POCT Lipid Panel   Collection Time: 02/11/24  7:51 AM  Result Value Ref Range   TC 170    HDL 71    TRG 59    LDL 87    Non-HDL 98    TC/HDL    Microalbumin / creatinine urine ratio   Collection Time: 02/11/24  8:13 AM  Result Value Ref Range   Creatinine, Urine 114.3 Not Estab. mg/dL  Microalbumin, Urine 6.3 Not Estab. ug/mL   Microalb/Creat Ratio 6 0 - 29 mg/g creat  CBC with Differential/Platelet   Collection Time: 02/11/24  8:13 AM  Result Value Ref Range   WBC 3.6 3.4 - 10.8 x10E3/uL   RBC 4.79 4.14 - 5.80 x10E6/uL   Hemoglobin 15.5 13.0 - 17.7 g/dL   Hematocrit 54.4 62.4 - 51.0 %   MCV 95 79 - 97 fL   MCH 32.4 26.6 - 33.0 pg   MCHC 34.1 31.5 - 35.7 g/dL   RDW 87.5 88.3 - 84.5 %   Platelets 188 150 - 450 x10E3/uL   Neutrophils 42 Not Estab. %   Lymphs 46 Not Estab. %   Monocytes 9 Not Estab. %   Eos 2 Not Estab. %   Basos 1 Not Estab. %   Neutrophils Absolute 1.5 1.4 - 7.0 x10E3/uL   Lymphocytes Absolute 1.7 0.7 - 3.1 x10E3/uL   Monocytes Absolute 0.3 0.1 - 0.9 x10E3/uL   EOS (ABSOLUTE) 0.1 0.0 -  0.4 x10E3/uL   Basophils Absolute 0.0 0.0 - 0.2 x10E3/uL   Immature Granulocytes 0 Not Estab. %   Immature Grans (Abs) 0.0 0.0 - 0.1 x10E3/uL  Comprehensive metabolic panel with GFR   Collection Time: 02/11/24  8:13 AM  Result Value Ref Range   Glucose 91 70 - 99 mg/dL   BUN 15 8 - 27 mg/dL   Creatinine, Ser 9.25 (L) 0.76 - 1.27 mg/dL   eGFR 898 >40 fO/fpw/8.26   BUN/Creatinine Ratio 20 10 - 24   Sodium 138 134 - 144 mmol/L   Potassium 4.5 3.5 - 5.2 mmol/L   Chloride 98 96 - 106 mmol/L   CO2 26 20 - 29 mmol/L   Calcium  9.5 8.6 - 10.2 mg/dL   Total Protein 6.9 6.0 - 8.5 g/dL   Albumin 4.6 3.9 - 4.9 g/dL   Globulin, Total 2.3 1.5 - 4.5 g/dL   Bilirubin Total 0.6 0.0 - 1.2 mg/dL   Alkaline Phosphatase 69 47 - 123 IU/L   AST 23 0 - 40 IU/L   ALT 24 0 - 44 IU/L  .  Assessment & Plan:   Assessment & Plan Mixed hyperlipidemia LDL cholesterol at 80 mg/dL, above target. Significant coronary artery cholesterol buildup noted. Emphasized cholesterol management to prevent myocardial infarction. Discussed potential cardiac catheterization if symptoms arise. Explained early detection benefits. - Increased rosuvastatin  to 40 mg daily. - Advised cardiology follow-up in three months. - Instructed to report any chest pain immediately. - Ordered liver and kidney function tests.  Orders:   POCT Lipid Panel   Diabetic polyneuropathy associated with type 2 diabetes mellitus (HCC) A1c at 6.2%, indicating good control. Gabapentin  effective for neuropathy.  - Continue Jardiance  10 mg daily and Ozempic  1 mg weekly.  Orders:   POCT glycosylated hemoglobin (Hb A1C)   Microalbumin / creatinine urine ratio   CBC with Differential/Platelet   Comprehensive metabolic panel with GFR   Coronary artery disease involving native coronary artery of native heart without angina pectoris Significant coronary artery cholesterol buildup noted on ct coronary scan, but subsequent stress test was normal.  _  Emphasized cholesterol management to prevent myocardial infarction.  - Increased rosuvastatin  to 40 mg daily. - Advised cardiology follow-up in three months. - Instructed to report any chest pain immediate Orders:   Ambulatory referral to Cardiology  Severe obesity (BMI 35.0-39.9) with comorbidity (HCC) Recommend continue to work on eating healthy diet and exercise. Continue ozempic  1 mg weekly. Consider increase.  Essential hypertension, benign Well controlled.  No changes to medicines. Lisinopril  10 mg daily, Aspirin 81 mg daily, Continue to work on eating a healthy diet and exercise.        Body mass index is 39.34 kg/m.    No orders of the defined types were placed in this encounter.   Orders Placed This Encounter  Procedures   Microalbumin / creatinine urine ratio   CBC with Differential/Platelet   Comprehensive metabolic panel with GFR   Ambulatory referral to Cardiology   POCT glycosylated hemoglobin (Hb A1C)   POCT Lipid Panel       Follow-up: Return in about 3 months (around 05/11/2024) for chronic follow up.  An After Visit Summary was printed and given to the patient.  Abigail Free, MD [facility] [phone]     [1]  Current Outpatient Medications on File Prior to Visit  Medication Sig Dispense Refill   Acetaminophen  (TYLENOL  8 HOUR PO) Take by mouth.     ALPRAZolam  (XANAX ) 0.5 MG tablet TAKE 1 TABLET(0.5 MG) BY MOUTH DAILY AS NEEDED 90 tablet 2   aspirin EC 81 MG tablet Take 81 mg by mouth daily.     bethanechol  (URECHOLINE ) 25 MG tablet Take 1 tablet (25 mg total) by mouth daily. 90 tablet 3   celecoxib  (CELEBREX ) 200 MG capsule TAKE 1 CAPSULE(200 MG) BY MOUTH twice daily 180 capsule 3   docusate sodium (COLACE) 100 MG capsule Take 100 mg by mouth 2 (two) times daily.     ezetimibe  (ZETIA ) 10 MG tablet Take 1 tablet (10 mg total) by mouth daily. 90 tablet 3   furosemide  (LASIX ) 20 MG tablet Take 1 tablet (20 mg total) by mouth daily.  90 tablet 3   gabapentin  (NEURONTIN ) 600 MG tablet TAKE 1 TABLET(600 MG) BY MOUTH THREE TIMES DAILY 270 tablet 1   glucose blood (ONETOUCH ULTRA) test strip Use as instructed 100 each 12   JARDIANCE  10 MG TABS tablet Take 1 tablet (10 mg total) by mouth daily. 90 tablet 2   levofloxacin  (LEVAQUIN ) 500 MG tablet TAKE 1 TABLET BY MOUTH DAILY FOR 5 DAYS AS NEEDED FOR UROLIFT INFECTION 30 tablet 0   linaclotide  (LINZESS ) 145 MCG CAPS capsule Take 1 capsule (145 mcg total) by mouth daily before breakfast. 90 capsule 3   lisinopril  (ZESTRIL ) 10 MG tablet TAKE 1 TABLET(10 MG) BY MOUTH DAILY 90 tablet 2   Omega-3 Fatty Acids (FISH OIL ) 1000 MG CAPS Take 1 capsule (1,000 mg total) by mouth daily. 90 capsule 3   omeprazole  (PRILOSEC) 20 MG capsule Take 1 capsule (20 mg total) by mouth daily. 90 capsule 2   OZEMPIC , 1 MG/DOSE, 4 MG/3ML SOPN INJECT 1 MG UNDER THE SKIN ONCE WEEKLY 3 mL 1   rosuvastatin  (CRESTOR ) 20 MG tablet TAKE 1 TABLET(20 MG) BY MOUTH DAILY 90 tablet 1   tadalafil  (CIALIS ) 5 MG tablet Take 1 tablet (5 mg total) by mouth daily. 90 tablet 1   venlafaxine  XR (EFFEXOR -XR) 75 MG 24 hr capsule TAKE 1 CAPSULE(75 MG) BY MOUTH DAILY WITH BREAKFAST 90 capsule 0   No current facility-administered medications on file prior to visit.   "

## 2024-02-11 NOTE — Patient Instructions (Addendum)
" °  VISIT SUMMARY: Today we reviewed your medications and followed up on your knee pain, diabetic neuropathy, diabetes, hyperlipidemia, and benign prostatic hyperplasia. Your A1c is well-controlled, and your knee pain is managed effectively with your current regimen.  YOUR PLAN: KNEE PAIN AND BURSITIS: Your knee pain is primarily due to bursitis and is well-managed with Celebrex . -Continue taking Celebrex  200 mg twice daily. -Report any new or worsening symptoms.  DIABETIC NEUROPATHY: You have neuropathic symptoms affecting your legs, which are managed with gabapentin . -Continue taking gabapentin  as prescribed.  TYPE 2 DIABETES MELLITUS: Your diabetes is well-controlled with an A1c of 6.2%. -Continue taking Jardiance  10 mg daily and Ozempic  1 mg weekly. -Ensure you have glucose monitoring supplies available.  HYPERLIPIDEMIA WITH CORONARY ARTERY ATHEROSCLEROSIS: Your LDL cholesterol is above target, and there is significant cholesterol buildup in your coronary arteries. -Increase rosuvastatin  to 40 mg daily. (Use up 20 mg taking 2 daily) -Follow up with cardiology in three months. -Report any chest pain immediately. -Complete liver and kidney function tests as ordered.  BENIGN PROSTATIC HYPERPLASIA WITH LOWER URINARY TRACT SYMPTOMS: Your urinary symptoms are well-managed with Bethanechol  and Cialis . -Continue taking Bethanechol  and Cialis  as prescribed.  GENERAL HEALTH MAINTENANCE: We discussed the importance of regular screenings and lifestyle modifications. -Maintain regular health screenings and follow a healthy lifestyle.  CALL GASTROENTEROLOGY AT  [phone] TO CONFIRM APPOINTMENT FOR COLON CANCER SCREENING.   Dr. Sherre Mow text generated by [employer]bridge.                                 Contains text generated by Abridge.   "

## 2024-02-11 NOTE — Assessment & Plan Note (Signed)
 LDL cholesterol at 80 mg/dL, above target. Significant coronary artery cholesterol buildup noted. Emphasized cholesterol management to prevent myocardial infarction. Discussed potential cardiac catheterization if symptoms arise. Explained early detection benefits. - Increased rosuvastatin  to 40 mg daily. - Advised cardiology follow-up in three months. - Instructed to report any chest pain immediately. - Ordered liver and kidney function tests.  Orders:   POCT Lipid Panel

## 2024-02-12 ENCOUNTER — Ambulatory Visit: Payer: Self-pay | Admitting: Family Medicine

## 2024-02-12 LAB — CBC WITH DIFFERENTIAL/PLATELET
Basophils Absolute: 0 x10E3/uL (ref 0.0–0.2)
Basos: 1 %
EOS (ABSOLUTE): 0.1 x10E3/uL (ref 0.0–0.4)
Eos: 2 %
Hematocrit: 45.5 % (ref 37.5–51.0)
Hemoglobin: 15.5 g/dL (ref 13.0–17.7)
Immature Grans (Abs): 0 x10E3/uL (ref 0.0–0.1)
Immature Granulocytes: 0 %
Lymphocytes Absolute: 1.7 x10E3/uL (ref 0.7–3.1)
Lymphs: 46 %
MCH: 32.4 pg (ref 26.6–33.0)
MCHC: 34.1 g/dL (ref 31.5–35.7)
MCV: 95 fL (ref 79–97)
Monocytes Absolute: 0.3 x10E3/uL (ref 0.1–0.9)
Monocytes: 9 %
Neutrophils Absolute: 1.5 x10E3/uL (ref 1.4–7.0)
Neutrophils: 42 %
Platelets: 188 x10E3/uL (ref 150–450)
RBC: 4.79 x10E6/uL (ref 4.14–5.80)
RDW: 12.4 % (ref 11.6–15.4)
WBC: 3.6 x10E3/uL (ref 3.4–10.8)

## 2024-02-12 LAB — COMPREHENSIVE METABOLIC PANEL WITH GFR
ALT: 24 IU/L (ref 0–44)
AST: 23 IU/L (ref 0–40)
Albumin: 4.6 g/dL (ref 3.9–4.9)
Alkaline Phosphatase: 69 IU/L (ref 47–123)
BUN/Creatinine Ratio: 20 (ref 10–24)
BUN: 15 mg/dL (ref 8–27)
Bilirubin Total: 0.6 mg/dL (ref 0.0–1.2)
CO2: 26 mmol/L (ref 20–29)
Calcium: 9.5 mg/dL (ref 8.6–10.2)
Chloride: 98 mmol/L (ref 96–106)
Creatinine, Ser: 0.74 mg/dL — ABNORMAL LOW (ref 0.76–1.27)
Globulin, Total: 2.3 g/dL (ref 1.5–4.5)
Glucose: 91 mg/dL (ref 70–99)
Potassium: 4.5 mmol/L (ref 3.5–5.2)
Sodium: 138 mmol/L (ref 134–144)
Total Protein: 6.9 g/dL (ref 6.0–8.5)
eGFR: 101 mL/min/1.73

## 2024-02-12 LAB — MICROALBUMIN / CREATININE URINE RATIO
Creatinine, Urine: 114.3 mg/dL
Microalb/Creat Ratio: 6 mg/g{creat} (ref 0–29)
Microalbumin, Urine: 6.3 ug/mL

## 2024-02-15 NOTE — Progress Notes (Incomplete)
 "  Subjective:  Patient ID: Brad Johnson, male    DOB: 1960-11-04  Age: 64 y.o. MRN: [id number]  Chief Complaint  Patient presents with   Medical Management of Chronic Issues    HPI: Discussed the use of AI scribe software for clinical note transcription with the patient, who gave verbal consent to proceed.  History of Present Illness Brad Johnson is a 64 year old male with hyperlipidemia and knee pain who presents for medication review and follow-up.  Knee pain and bursitis - Knee pain primarily attributed to bursitis - Takes Celebrex  200 mg twice daily with significant relief - Able to sit comfortably with current regimen - No chest pain or breathing problems  Diabetic neuropathy - Neuropathic symptoms affecting the legs - Takes gabapentin  for symptom management  Diabetes mellitus type 2 - Takes Jardiance  10 mg daily and Ozempic  1 mg weekly - A1c well-controlled at 6.2 - Currently out of test strips and needles for glucose monitoring  Hyperlipidemia - Takes rosuvastatin  20 mg daily and fish oil  - Recalls a CT scan showing cholesterol buildup and a prior stress test - No current symptoms of heart disease  Benign prostatic hyperplasia and urinary symptoms - Takes tadalafil  daily with effective symptom control - Uses bethanechol  for flare-ups related to a past procedure       02/11/2024    7:37 AM 07/16/2023    7:53 AM 07/08/2023    9:11 AM 10/30/2022    7:38 AM 06/30/2022    8:13 AM  Depression screen PHQ 2/9  Decreased Interest 0 0 0 0 0  Down, Depressed, Hopeless 0 3 0 0 0  PHQ - 2 Score 0 3 0 0 0  Altered sleeping 0 0     Tired, decreased energy 0 3     Change in appetite 0 3     Feeling bad or failure about yourself  0 3     Trouble concentrating 0 3     Moving slowly or fidgety/restless 0 1     Suicidal thoughts 0 0     PHQ-9 Score 0 16      Difficult doing work/chores Not difficult at all Not difficult at all        Data saved with a previous flowsheet  row definition        02/11/2024    7:37 AM  Fall Risk   Falls in the past year? 0  Number falls in past yr: 0  Injury with Fall? 0  Risk for fall due to : No Fall Risks  Follow up Falls evaluation completed    Patient Care Team: Sherre Clapper, MD as PCP - General (Family Medicine) Nyle Rankin [facility], [facility] (Inactive) (Pharmacist) Bonner Ade, MD as Consulting Physician (Physical Medicine and Rehabilitation) Ival Domino, FNP as Nurse Practitioner (Urology) Misenheimer, Evalene, MD as Consulting Physician (Gastroenterology)   Review of Systems  Medications Ordered Prior to Encounter[1] Past Medical History:  Diagnosis Date   Adjustment disorder with depressed mood 03/19/2019   Arthritis    BMI 39.0-39.9,adult 08/27/2021   CHI (closed head injury), sequela 03/19/2019   Class 3 severe obesity due to excess calories with serious comorbidity and body mass index (BMI) of 40.0 to 44.9 in adult [facility]) 11/01/2022   Depression    Diabetic polyneuropathy (HCC) 03/19/2019   Disc displacement, lumbar 06/08/2018   Dysthymic disorder 06/08/2018   Encounter for long-term use of opiate analgesic 03/19/2019   Enlarged prostate with urinary obstruction 06/08/2018  Esophageal reflux 06/08/2018   Essential hypertension, benign 06/08/2018   History of total left hip arthroplasty 06/08/2018   Hyperlipidemia    Illiteracy 03/19/2019   Irritable bowel syndrome 06/08/2018   Mixed hyperlipidemia 06/08/2018   Osteoarthritis 06/08/2018   Osteoarthritis of left knee 11/01/2022   Osteoarthritis of one hip, right 11/01/2022   Other fatigue 10/30/2022   Polyneuropathy 06/08/2018   Preoperative cardiovascular examination 10/30/2022   Presenile dementia (HCC) 06/08/2018   Prostate enlargement    Sinus bradycardia 10/30/2022   Trochanteric bursitis of right hip 06/08/2018   Past Surgical History:  Procedure Laterality Date   CYSTOSCOPY WITH INSERTION OF UROLIFT      SPINE SURGERY     TOTAL HIP ARTHROPLASTY     TOTAL KNEE ARTHROPLASTY Left 12/2022    Family History  Problem Relation Age of Onset   Diabetes Mother    Lung cancer Mother    Diabetes Father    Cancer Father    Alzheimer's disease Father    Heart disease Father    Stroke Father    Social History   Socioeconomic History   Marital status: Widowed    Spouse name: Not on file   Number of children: Not on file   Years of education: Not on file   Highest education level: 9th grade  Occupational History   Not on file  Tobacco Use   Smoking status: Never   Smokeless tobacco: Never  Vaping Use   Vaping status: Never Used  Substance and Sexual Activity   Alcohol use: Never   Drug use: Never   Sexual activity: Not on file  Other Topics Concern   Not on file  Social History Narrative   Not on file   Social Drivers of Health   Tobacco Use: Low Risk (02/11/2024)   Patient History    Smoking Tobacco Use: Never    Smokeless Tobacco Use: Never    Passive Exposure: Not on file  Financial Resource Strain: Low Risk (07/08/2023)   Overall Financial Resource Strain (CARDIA)    Difficulty of Paying Living Expenses: Not hard at all  Food Insecurity: No Food Insecurity (07/08/2023)   Hunger Vital Sign    Worried About Running Out of Food in the Last Year: Never true    Ran Out of Food in the Last Year: Never true  Transportation Needs: No Transportation Needs (07/08/2023)   PRAPARE - Administrator, Civil Service (Medical): No    Lack of Transportation (Non-Medical): No  Physical Activity: Insufficiently Active (07/08/2023)   Exercise Vital Sign    Days of Exercise per Week: 3 days    Minutes of Exercise per Session: 30 min  Stress: No Stress Concern Present (07/08/2023)   [employer] of Occupational Health - Occupational Stress Questionnaire    Feeling of Stress : Not at all  Social Connections: Moderately Isolated (07/08/2023)   Social  Connection and Isolation Panel    Frequency of Communication with Friends and Family: More than three times a week    Frequency of Social Gatherings with Friends and Family: More than three times a week    Attends Religious Services: Never    Database Administrator or Organizations: No    Attends Banker Meetings: Never    Marital Status: Married  Depression (PHQ2-9): Low Risk (02/11/2024)   Depression (PHQ2-9)    PHQ-2 Score: 0  Alcohol Screen: Low Risk (07/08/2023)   Alcohol Screen    Last Alcohol Screening  Score (AUDIT): 0  Housing: Unknown (07/16/2023)   Epic    Unable to Pay for Housing in the Last Year: Not on file    Number of Times Moved in the Last Year: 0    Homeless in the Last Year: Not on file  Utilities: Not At Risk (07/08/2023)   AHC Utilities    Threatened with loss of utilities: No  Health Literacy: Adequate Health Literacy (07/08/2023)   B1300 Health Literacy    Frequency of need for help with medical instructions: Never    Objective:  BP 116/72   Pulse 60   Temp 97.8 F (36.6 C)   Ht 6' 1 (1.854 m)   Wt 298 lb 3.2 oz (135.3 kg)   SpO2 95%   BMI 39.34 kg/m      02/11/2024    7:30 AM 10/28/2023    7:34 AM 07/16/2023    7:36 AM  BP/Weight  Systolic BP 116 110 126  Diastolic BP 72 62 72  Wt. (Lbs) 298.2 291.8 296  BMI 39.34 kg/m2 38.5 kg/m2 39.05 kg/m2    Physical Exam  {Perform Simple Foot Exam  Perform Detailed exam:1} {Insert foot Exam (Optional):30965}   Lab Results  Component Value Date   WBC 2.9 (L) 10/28/2023   HGB 15.7 10/28/2023   HCT 47.7 10/28/2023   PLT 193 10/28/2023   GLUCOSE 96 10/28/2023   CHOL 142 07/16/2023   TRIG 76 07/16/2023   HDL 69 07/16/2023   LDLCALC 58 07/16/2023   ALT 25 10/28/2023   AST 23 10/28/2023   NA 137 10/28/2023   K 4.7 10/28/2023   CL 98 10/28/2023   CREATININE 0.79 10/28/2023   BUN 13 10/28/2023   CO2 24 10/28/2023   TSH 2.970 07/16/2023   INR 1.0 10/30/2022   HGBA1C 6.2  02/11/2024    Results for orders placed or performed in visit on 02/11/24  POCT glycosylated hemoglobin (Hb A1C)   Collection Time: 02/11/24  7:47 AM  Result Value Ref Range   Hemoglobin A1C     HbA1c POC (<> result, manual entry) 6.2 4.0 - 5.6 %   HbA1c, POC (prediabetic range)     HbA1c, POC (controlled diabetic range)    .  Assessment & Plan:   Assessment & Plan Mixed hyperlipidemia  Orders:   POCT Lipid Panel  Diabetic polyneuropathy associated with type 2 diabetes mellitus (HCC)  Orders:   POCT glycosylated hemoglobin (Hb A1C)    Body mass index is 39.34 kg/m.  Assessment and Plan Assessment & Plan Mixed hyperlipidemia with coronary artery atherosclerosis LDL cholesterol at 80 mg/dL, above target. Significant coronary artery cholesterol buildup noted. Emphasized cholesterol management to prevent myocardial infarction. Discussed potential cardiac catheterization if symptoms arise. Explained early detection benefits. - Increased rosuvastatin  to 40 mg daily. - Advised cardiology follow-up in three months. - Instructed to report any chest pain immediately. - Ordered liver and kidney function tests.  Type 2 diabetes mellitus with diabetic polyneuropathy A1c at 6.2%, indicating good control. Gabapentin  effective for neuropathy. No issues with glucose monitoring due to supply shortage. - Continue Jardiance  10 mg daily and Ozempic  1 mg weekly. - Ensure availability of glucose monitoring supplies.  Benign prostatic hyperplasia with lower urinary tract symptoms Bethanechol  effective in preventing flare-ups. Cialis  used for erectile dysfunction and urinary symptoms. - Continue Bethanechol  and Cialis .  General health maintenance Discussed importance of regular screenings and lifestyle modifications.  Recording duration: 15 minutes     No orders of the defined  types were placed in this encounter.   Orders Placed This Encounter  Procedures   POCT glycosylated  hemoglobin (Hb A1C)   POCT Lipid Panel       Follow-up: No follow-ups on file.  An After Visit Summary was printed and given to the patient.  Abigail Free, MD [facility] [phone]       [1] Current Outpatient Medications on File Prior to Visit  Medication Sig Dispense Refill   Acetaminophen  (TYLENOL  8 HOUR PO) Take by mouth.     ALPRAZolam  (XANAX ) 0.5 MG tablet TAKE 1 TABLET(0.5 MG) BY MOUTH DAILY AS NEEDED 90 tablet 2   aspirin EC 81 MG tablet Take 81 mg by mouth daily.     bethanechol  (URECHOLINE ) 25 MG tablet Take 1 tablet (25 mg total) by mouth daily. 90 tablet 3   celecoxib  (CELEBREX ) 200 MG capsule TAKE 1 CAPSULE(200 MG) BY MOUTH twice daily 180 capsule 3   docusate sodium (COLACE) 100 MG capsule Take 100 mg by mouth 2 (two) times daily.     ezetimibe  (ZETIA ) 10 MG tablet Take 1 tablet (10 mg total) by mouth daily. 90 tablet 3   furosemide  (LASIX ) 20 MG tablet Take 1 tablet (20 mg total) by mouth daily. 90 tablet 3   gabapentin  (NEURONTIN ) 600 MG tablet TAKE 1 TABLET(600 MG) BY MOUTH THREE TIMES DAILY 270 tablet 1   glucose blood (ONETOUCH ULTRA) test strip Use as instructed 100 each 12   JARDIANCE  10 MG TABS tablet Take 1 tablet (10 mg total) by mouth daily. 90 tablet 2   levofloxacin  (LEVAQUIN ) 500 MG tablet TAKE 1 TABLET BY MOUTH DAILY FOR 5 DAYS AS NEEDED FOR UROLIFT INFECTION 30 tablet 0   linaclotide  (LINZESS ) 145 MCG CAPS capsule Take 1 capsule (145 mcg total) by mouth daily before breakfast. 90 capsule 3   lisinopril  (ZESTRIL ) 10 MG tablet TAKE 1 TABLET(10 MG) BY MOUTH DAILY 90 tablet 2   Omega-3 Fatty Acids (FISH OIL ) 1000 MG CAPS Take 1 capsule (1,000 mg total) by mouth daily. 90 capsule 3   omeprazole  (PRILOSEC) 20 MG capsule Take 1 capsule (20 mg total) by mouth daily. 90 capsule 2   OZEMPIC , 1 MG/DOSE, 4 MG/3ML SOPN INJECT 1 MG UNDER THE SKIN ONCE WEEKLY 3 mL 1   rosuvastatin  (CRESTOR ) 20 MG tablet TAKE 1 TABLET(20 MG) BY MOUTH  DAILY 90 tablet 1   tadalafil  (CIALIS ) 5 MG tablet Take 1 tablet (5 mg total) by mouth daily. 90 tablet 1   venlafaxine  XR (EFFEXOR -XR) 75 MG 24 hr capsule TAKE 1 CAPSULE(75 MG) BY MOUTH DAILY WITH BREAKFAST 90 capsule 0   No current facility-administered medications on file prior to visit.  "

## 2024-02-15 NOTE — Assessment & Plan Note (Signed)
 Well controlled.  No changes to medicines. Lisinopril  10 mg daily, Aspirin 81 mg daily, Continue to work on eating a healthy diet and exercise.

## 2024-02-15 NOTE — Assessment & Plan Note (Signed)
 Recommend continue to work on eating healthy diet and exercise. Continue ozempic  1 mg weekly. Consider increase.

## 2024-02-24 ENCOUNTER — Other Ambulatory Visit (HOSPITAL_COMMUNITY): Payer: Self-pay

## 2024-02-24 ENCOUNTER — Telehealth: Payer: Self-pay

## 2024-02-24 NOTE — Telephone Encounter (Signed)
 Pharmacy Patient Advocate Encounter  Received notification from [employer] that Prior Authorization for tadalafilhas been APPROVED from 02/24/2024 to 02/23/2025. Ran test claim, Copay is $5.10. This test claim was processed through [facility]- copay amounts may vary at other pharmacies due to pharmacy/plan contracts, or as the patient moves through the different stages of their insurance plan.   PA #/Case ID/Reference #: [id number]

## 2024-02-24 NOTE — Telephone Encounter (Signed)
 Pharmacy Patient Advocate Encounter   Received notification from [facility] KEY that prior authorization for Tadalafil  is required/requested.   Insurance verification completed.   The patient is insured through [facility] [employer].   Per test claim: PA required; PA submitted to above mentioned insurance via Latent Key/confirmation #/EOC [id number] Status is pending

## 2024-03-10 ENCOUNTER — Other Ambulatory Visit: Payer: Self-pay

## 2024-05-16 ENCOUNTER — Ambulatory Visit: Admitting: Family Medicine

## 2024-07-13 ENCOUNTER — Ambulatory Visit
